# Patient Record
Sex: Female | Born: 1965 | Race: Black or African American | Hispanic: No | Marital: Married | Smoking: Never smoker
Health system: Southern US, Community
[De-identification: ages and names within clinical notes are randomized; demographics above are authoritative.]

## PROBLEM LIST (undated history)

## (undated) DIAGNOSIS — I471 Supraventricular tachycardia, unspecified: Secondary | ICD-10-CM

## (undated) DIAGNOSIS — K219 Gastro-esophageal reflux disease without esophagitis: Secondary | ICD-10-CM

## (undated) DIAGNOSIS — D649 Anemia, unspecified: Secondary | ICD-10-CM

## (undated) DIAGNOSIS — F411 Generalized anxiety disorder: Secondary | ICD-10-CM

## (undated) DIAGNOSIS — K5909 Other constipation: Secondary | ICD-10-CM

## (undated) DIAGNOSIS — R51 Headache: Secondary | ICD-10-CM

## (undated) DIAGNOSIS — I10 Essential (primary) hypertension: Secondary | ICD-10-CM

## (undated) HISTORY — PX: CERVICAL DISC SURGERY: SHX588

## (undated) HISTORY — DX: Headache: R51

## (undated) HISTORY — DX: Supraventricular tachycardia: I47.1

## (undated) HISTORY — PX: TUBAL LIGATION: SHX77

## (undated) HISTORY — DX: Generalized anxiety disorder: F41.1

## (undated) HISTORY — DX: Other constipation: K59.09

## (undated) HISTORY — PX: HERNIA REPAIR: SHX51

## (undated) HISTORY — PX: ABDOMINAL HYSTERECTOMY: SHX81

## (undated) HISTORY — DX: Supraventricular tachycardia, unspecified: I47.10

---

## 1997-06-04 ENCOUNTER — Inpatient Hospital Stay (HOSPITAL_COMMUNITY): Admission: AD | Admit: 1997-06-04 | Discharge: 1997-06-04 | Payer: Self-pay | Admitting: Obstetrics & Gynecology

## 1997-09-09 ENCOUNTER — Ambulatory Visit (HOSPITAL_COMMUNITY): Admission: RE | Admit: 1997-09-09 | Discharge: 1997-09-09 | Payer: Self-pay | Admitting: Obstetrics and Gynecology

## 1997-11-26 ENCOUNTER — Ambulatory Visit (HOSPITAL_COMMUNITY): Admission: RE | Admit: 1997-11-26 | Discharge: 1997-11-26 | Payer: Self-pay | Admitting: Obstetrics and Gynecology

## 1998-01-28 ENCOUNTER — Inpatient Hospital Stay (HOSPITAL_COMMUNITY): Admission: AD | Admit: 1998-01-28 | Discharge: 1998-01-28 | Payer: Self-pay | Admitting: *Deleted

## 1998-01-29 ENCOUNTER — Inpatient Hospital Stay (HOSPITAL_COMMUNITY): Admission: AD | Admit: 1998-01-29 | Discharge: 1998-01-31 | Payer: Self-pay | Admitting: Obstetrics & Gynecology

## 1998-11-10 ENCOUNTER — Encounter: Admission: RE | Admit: 1998-11-10 | Discharge: 1998-11-10 | Payer: Self-pay | Admitting: Internal Medicine

## 1998-12-09 ENCOUNTER — Encounter: Payer: Self-pay | Admitting: Internal Medicine

## 1998-12-09 ENCOUNTER — Encounter: Admission: RE | Admit: 1998-12-09 | Discharge: 1998-12-09 | Payer: Self-pay | Admitting: Internal Medicine

## 1999-05-19 ENCOUNTER — Other Ambulatory Visit: Admission: RE | Admit: 1999-05-19 | Discharge: 1999-05-19 | Payer: Self-pay | Admitting: *Deleted

## 1999-11-05 ENCOUNTER — Emergency Department (HOSPITAL_COMMUNITY): Admission: EM | Admit: 1999-11-05 | Discharge: 1999-11-06 | Payer: Self-pay | Admitting: Emergency Medicine

## 1999-12-12 ENCOUNTER — Inpatient Hospital Stay (HOSPITAL_COMMUNITY): Admission: RE | Admit: 1999-12-12 | Discharge: 1999-12-13 | Payer: Self-pay | Admitting: Neurosurgery

## 1999-12-12 ENCOUNTER — Encounter: Payer: Self-pay | Admitting: Neurosurgery

## 2000-09-11 ENCOUNTER — Other Ambulatory Visit: Admission: RE | Admit: 2000-09-11 | Discharge: 2000-09-11 | Payer: Self-pay | Admitting: Obstetrics and Gynecology

## 2000-11-29 ENCOUNTER — Encounter: Payer: Self-pay | Admitting: Obstetrics and Gynecology

## 2000-11-29 ENCOUNTER — Ambulatory Visit (HOSPITAL_COMMUNITY): Admission: RE | Admit: 2000-11-29 | Discharge: 2000-11-29 | Payer: Self-pay | Admitting: Obstetrics and Gynecology

## 2001-06-24 ENCOUNTER — Ambulatory Visit (HOSPITAL_COMMUNITY): Admission: RE | Admit: 2001-06-24 | Discharge: 2001-06-24 | Payer: Self-pay | Admitting: Obstetrics and Gynecology

## 2001-08-09 ENCOUNTER — Ambulatory Visit (HOSPITAL_COMMUNITY): Admission: AD | Admit: 2001-08-09 | Discharge: 2001-08-09 | Payer: Self-pay | Admitting: Obstetrics and Gynecology

## 2001-08-13 ENCOUNTER — Inpatient Hospital Stay (HOSPITAL_COMMUNITY): Admission: AD | Admit: 2001-08-13 | Discharge: 2001-08-15 | Payer: Self-pay | Admitting: Obstetrics and Gynecology

## 2001-09-20 ENCOUNTER — Ambulatory Visit (HOSPITAL_COMMUNITY): Admission: RE | Admit: 2001-09-20 | Discharge: 2001-09-20 | Payer: Self-pay | Admitting: Obstetrics and Gynecology

## 2002-05-15 ENCOUNTER — Emergency Department (HOSPITAL_COMMUNITY): Admission: EM | Admit: 2002-05-15 | Discharge: 2002-05-15 | Payer: Self-pay | Admitting: Emergency Medicine

## 2003-01-28 ENCOUNTER — Ambulatory Visit: Admission: RE | Admit: 2003-01-28 | Discharge: 2003-01-28 | Payer: Self-pay | Admitting: Internal Medicine

## 2003-08-31 ENCOUNTER — Encounter: Payer: Self-pay | Admitting: Family Medicine

## 2003-08-31 LAB — CONVERTED CEMR LAB: TSH: 1.15 microintl units/mL

## 2003-11-26 ENCOUNTER — Encounter: Payer: Self-pay | Admitting: Emergency Medicine

## 2003-11-27 ENCOUNTER — Observation Stay (HOSPITAL_COMMUNITY): Admission: EM | Admit: 2003-11-27 | Discharge: 2003-11-28 | Payer: Self-pay | Admitting: Emergency Medicine

## 2003-11-27 ENCOUNTER — Encounter: Payer: Self-pay | Admitting: Cardiovascular Disease

## 2003-11-30 ENCOUNTER — Emergency Department (HOSPITAL_COMMUNITY): Admission: EM | Admit: 2003-11-30 | Discharge: 2003-11-30 | Payer: Self-pay | Admitting: *Deleted

## 2003-12-03 ENCOUNTER — Ambulatory Visit: Payer: Self-pay | Admitting: Internal Medicine

## 2003-12-08 ENCOUNTER — Ambulatory Visit: Payer: Self-pay | Admitting: Cardiology

## 2003-12-09 ENCOUNTER — Ambulatory Visit (HOSPITAL_COMMUNITY): Admission: RE | Admit: 2003-12-09 | Discharge: 2003-12-09 | Payer: Self-pay | Admitting: Cardiology

## 2003-12-09 HISTORY — PX: CARDIAC CATHETERIZATION: SHX172

## 2003-12-22 ENCOUNTER — Ambulatory Visit: Payer: Self-pay | Admitting: Family Medicine

## 2004-02-03 ENCOUNTER — Ambulatory Visit: Payer: Self-pay | Admitting: Family Medicine

## 2004-06-29 ENCOUNTER — Ambulatory Visit: Payer: Self-pay | Admitting: Internal Medicine

## 2004-12-17 ENCOUNTER — Emergency Department: Payer: Self-pay | Admitting: General Practice

## 2005-02-08 ENCOUNTER — Emergency Department (HOSPITAL_COMMUNITY): Admission: EM | Admit: 2005-02-08 | Discharge: 2005-02-08 | Payer: Self-pay | Admitting: Emergency Medicine

## 2005-10-23 ENCOUNTER — Ambulatory Visit: Payer: Self-pay | Admitting: Family Medicine

## 2005-11-20 ENCOUNTER — Emergency Department (HOSPITAL_COMMUNITY): Admission: EM | Admit: 2005-11-20 | Discharge: 2005-11-20 | Payer: Self-pay | Admitting: Emergency Medicine

## 2006-03-10 ENCOUNTER — Ambulatory Visit: Payer: Self-pay | Admitting: Family Medicine

## 2006-03-14 ENCOUNTER — Ambulatory Visit: Payer: Self-pay | Admitting: Family Medicine

## 2006-07-16 ENCOUNTER — Encounter (INDEPENDENT_AMBULATORY_CARE_PROVIDER_SITE_OTHER): Payer: Self-pay | Admitting: Internal Medicine

## 2006-07-16 ENCOUNTER — Ambulatory Visit: Payer: Self-pay | Admitting: Family Medicine

## 2006-07-16 DIAGNOSIS — F411 Generalized anxiety disorder: Secondary | ICD-10-CM | POA: Insufficient documentation

## 2006-08-20 ENCOUNTER — Emergency Department (HOSPITAL_COMMUNITY): Admission: EM | Admit: 2006-08-20 | Discharge: 2006-08-20 | Payer: Self-pay | Admitting: Emergency Medicine

## 2006-11-21 ENCOUNTER — Ambulatory Visit: Payer: Self-pay | Admitting: Family Medicine

## 2006-11-21 LAB — CONVERTED CEMR LAB
Bacteria, UA: 0
Specific Gravity, Urine: 1.015

## 2006-11-27 ENCOUNTER — Ambulatory Visit (HOSPITAL_COMMUNITY): Admission: RE | Admit: 2006-11-27 | Discharge: 2006-11-27 | Payer: Self-pay | Admitting: Obstetrics and Gynecology

## 2006-11-27 ENCOUNTER — Encounter: Payer: Self-pay | Admitting: Family Medicine

## 2006-11-30 ENCOUNTER — Encounter (INDEPENDENT_AMBULATORY_CARE_PROVIDER_SITE_OTHER): Payer: Self-pay | Admitting: *Deleted

## 2006-12-06 ENCOUNTER — Other Ambulatory Visit: Admission: RE | Admit: 2006-12-06 | Discharge: 2006-12-06 | Payer: Self-pay | Admitting: Obstetrics & Gynecology

## 2006-12-19 ENCOUNTER — Ambulatory Visit: Payer: Self-pay | Admitting: Gastroenterology

## 2006-12-20 ENCOUNTER — Ambulatory Visit: Payer: Self-pay | Admitting: Cardiology

## 2007-01-02 ENCOUNTER — Ambulatory Visit: Payer: Self-pay

## 2007-01-02 ENCOUNTER — Encounter: Payer: Self-pay | Admitting: Cardiology

## 2007-03-27 ENCOUNTER — Encounter: Payer: Self-pay | Admitting: Family Medicine

## 2007-03-27 DIAGNOSIS — I498 Other specified cardiac arrhythmias: Secondary | ICD-10-CM | POA: Insufficient documentation

## 2007-03-27 DIAGNOSIS — K5909 Other constipation: Secondary | ICD-10-CM | POA: Insufficient documentation

## 2007-03-27 DIAGNOSIS — E7800 Pure hypercholesterolemia, unspecified: Secondary | ICD-10-CM | POA: Insufficient documentation

## 2007-03-27 DIAGNOSIS — R109 Unspecified abdominal pain: Secondary | ICD-10-CM | POA: Insufficient documentation

## 2007-03-27 DIAGNOSIS — R198 Other specified symptoms and signs involving the digestive system and abdomen: Secondary | ICD-10-CM | POA: Insufficient documentation

## 2007-05-01 ENCOUNTER — Telehealth: Payer: Self-pay | Admitting: Family Medicine

## 2007-09-24 ENCOUNTER — Ambulatory Visit: Payer: Self-pay | Admitting: Family Medicine

## 2007-09-24 LAB — CONVERTED CEMR LAB
AST: 23 units/L (ref 0–37)
Albumin: 3.7 g/dL (ref 3.5–5.2)
BUN: 17 mg/dL (ref 6–23)
Basophils Relative: 0.2 % (ref 0.0–3.0)
Creatinine, Ser: 0.9 mg/dL (ref 0.4–1.2)
Direct LDL: 119.3 mg/dL
Eosinophils Absolute: 0 10*3/uL (ref 0.0–0.7)
Eosinophils Relative: 0.4 % (ref 0.0–5.0)
GFR calc Af Amer: 88 mL/min
GFR calc non Af Amer: 73 mL/min
HCT: 36.1 % (ref 36.0–46.0)
MCV: 89.1 fL (ref 78.0–100.0)
Monocytes Absolute: 0.5 10*3/uL (ref 0.1–1.0)
RBC: 4.05 M/uL (ref 3.87–5.11)
WBC: 6.9 10*3/uL (ref 4.5–10.5)

## 2007-10-01 ENCOUNTER — Ambulatory Visit: Payer: Self-pay | Admitting: Family Medicine

## 2007-10-01 DIAGNOSIS — S139XXA Sprain of joints and ligaments of unspecified parts of neck, initial encounter: Secondary | ICD-10-CM | POA: Insufficient documentation

## 2007-10-01 DIAGNOSIS — R519 Headache, unspecified: Secondary | ICD-10-CM | POA: Insufficient documentation

## 2007-10-01 DIAGNOSIS — R51 Headache: Secondary | ICD-10-CM | POA: Insufficient documentation

## 2007-10-01 DIAGNOSIS — J310 Chronic rhinitis: Secondary | ICD-10-CM | POA: Insufficient documentation

## 2007-11-28 ENCOUNTER — Telehealth: Payer: Self-pay | Admitting: Family Medicine

## 2008-01-15 ENCOUNTER — Ambulatory Visit (HOSPITAL_COMMUNITY): Admission: RE | Admit: 2008-01-15 | Discharge: 2008-01-15 | Payer: Self-pay | Admitting: Neurosurgery

## 2008-01-21 ENCOUNTER — Ambulatory Visit: Payer: Self-pay | Admitting: Family Medicine

## 2008-01-21 DIAGNOSIS — L259 Unspecified contact dermatitis, unspecified cause: Secondary | ICD-10-CM | POA: Insufficient documentation

## 2008-02-19 ENCOUNTER — Ambulatory Visit: Payer: Self-pay | Admitting: Family Medicine

## 2008-02-20 ENCOUNTER — Encounter: Payer: Self-pay | Admitting: Family Medicine

## 2008-03-11 ENCOUNTER — Ambulatory Visit (HOSPITAL_COMMUNITY): Admission: RE | Admit: 2008-03-11 | Discharge: 2008-03-11 | Payer: Self-pay | Admitting: Obstetrics & Gynecology

## 2008-06-02 ENCOUNTER — Emergency Department (HOSPITAL_COMMUNITY): Admission: EM | Admit: 2008-06-02 | Discharge: 2008-06-02 | Payer: Self-pay | Admitting: Emergency Medicine

## 2008-11-13 ENCOUNTER — Ambulatory Visit: Payer: Self-pay | Admitting: Internal Medicine

## 2008-11-13 DIAGNOSIS — L989 Disorder of the skin and subcutaneous tissue, unspecified: Secondary | ICD-10-CM | POA: Insufficient documentation

## 2008-11-16 LAB — CONVERTED CEMR LAB
Calcium: 8.7 mg/dL (ref 8.4–10.5)
Creatinine, Ser: 1 mg/dL (ref 0.4–1.2)
GFR calc non Af Amer: 77.43 mL/min (ref >60)
Glucose, Bld: 91 mg/dL (ref 70–99)
Sodium: 138 meq/L (ref 135–145)
TSH: 1.38 microintl units/mL (ref 0.35–5.50)

## 2009-03-12 ENCOUNTER — Ambulatory Visit: Payer: Self-pay | Admitting: Family Medicine

## 2009-03-12 DIAGNOSIS — J069 Acute upper respiratory infection, unspecified: Secondary | ICD-10-CM | POA: Insufficient documentation

## 2009-04-11 ENCOUNTER — Emergency Department (HOSPITAL_COMMUNITY): Admission: EM | Admit: 2009-04-11 | Discharge: 2009-04-11 | Payer: Self-pay | Admitting: Family Medicine

## 2009-04-11 ENCOUNTER — Emergency Department (HOSPITAL_COMMUNITY): Admission: EM | Admit: 2009-04-11 | Discharge: 2009-04-12 | Payer: Self-pay | Admitting: Emergency Medicine

## 2009-04-14 ENCOUNTER — Ambulatory Visit: Payer: Self-pay | Admitting: Family Medicine

## 2009-04-14 DIAGNOSIS — R091 Pleurisy: Secondary | ICD-10-CM | POA: Insufficient documentation

## 2009-05-11 ENCOUNTER — Ambulatory Visit: Payer: Self-pay | Admitting: Gastroenterology

## 2009-08-10 ENCOUNTER — Ambulatory Visit: Payer: Self-pay | Admitting: Family Medicine

## 2009-08-10 DIAGNOSIS — F4329 Adjustment disorder with other symptoms: Secondary | ICD-10-CM | POA: Insufficient documentation

## 2009-08-11 ENCOUNTER — Telehealth: Payer: Self-pay | Admitting: Family Medicine

## 2009-08-17 ENCOUNTER — Ambulatory Visit: Payer: Self-pay | Admitting: Psychology

## 2009-08-31 ENCOUNTER — Ambulatory Visit: Payer: Self-pay | Admitting: Family Medicine

## 2009-08-31 ENCOUNTER — Ambulatory Visit: Payer: Self-pay | Admitting: Psychology

## 2009-09-06 ENCOUNTER — Emergency Department (HOSPITAL_COMMUNITY): Admission: EM | Admit: 2009-09-06 | Discharge: 2009-09-06 | Payer: Self-pay | Admitting: Emergency Medicine

## 2009-09-06 ENCOUNTER — Encounter (INDEPENDENT_AMBULATORY_CARE_PROVIDER_SITE_OTHER): Payer: Self-pay | Admitting: *Deleted

## 2009-09-08 ENCOUNTER — Encounter: Payer: Self-pay | Admitting: Family Medicine

## 2009-09-08 ENCOUNTER — Ambulatory Visit: Payer: Self-pay | Admitting: Psychology

## 2009-09-13 ENCOUNTER — Other Ambulatory Visit: Admission: RE | Admit: 2009-09-13 | Discharge: 2009-09-13 | Payer: Self-pay | Admitting: Obstetrics and Gynecology

## 2009-09-15 ENCOUNTER — Ambulatory Visit: Payer: Self-pay | Admitting: Psychology

## 2009-09-22 ENCOUNTER — Ambulatory Visit: Payer: Self-pay | Admitting: Psychology

## 2010-01-11 ENCOUNTER — Ambulatory Visit (HOSPITAL_COMMUNITY)
Admission: RE | Admit: 2010-01-11 | Discharge: 2010-01-11 | Payer: Self-pay | Source: Home / Self Care | Attending: Obstetrics and Gynecology | Admitting: Obstetrics and Gynecology

## 2010-01-11 LAB — HM MAMMOGRAPHY: HM Mammogram: NORMAL

## 2010-02-20 ENCOUNTER — Encounter: Payer: Self-pay | Admitting: Obstetrics and Gynecology

## 2010-03-01 NOTE — Letter (Signed)
Summary: [facility] FMLA Form  [facility] FMLA Form   Imported By: Beau Fanny 08/11/2009 16:56:59  _____________________________________________________________________  External Attachment:    Type:   Image     Comment:   External Document

## 2010-03-01 NOTE — Assessment & Plan Note (Signed)
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  Increased stress caring for mother (near [place]) and three kids.  Took this week off with vacation time.  SVT symptoms have increased as stress increased.  Has h/o anxiety, increased recently.  More insomnia noted by Wright.  No SI/HI.  Much more fatigued.  "I don't have things that I can look forward to."  More tearful.  "It's never been like this."   No tob, no alcohol, no illicits.  H/o postpartum depression.    Current Medications (verified): 1)  Multivitamins   Tabs (Multiple Vitamin) .... One Dialy  Allergies: 1)  ! Sulfa  Social History: Reviewed history from 03/27/2007 and no changes required. Marital Status: Married  LIVES WITH HUSBAND Children: 4 CHILDREN  Occupation: [employer] TE[employer]  Review of Systems       See HPI.  Otherwise noncontributory.    Physical Exam  General:  GEN: alert and oriented, but tearful describing her condition/history HEENT: mucous membranes moist NECK: supple w/o LA CV: rrr.  no murmur, no ectopy PULM: ctab, no inc wob ABD: soft, +bs EXT: no edema, 2+ pulses SKIN: no acute rash    Impression & Recommendations:  Problem # 1:  ADJUSTMENT REACTION WITH PHYSICAL SYMPTOMS (ICD-309.82) LIkey adjustment d/o with depressed mood and physical symptoms of increase SVT episodes.  Refer to Dr. Laymond Purser and hold out of work.  Will fill out FMLA.  Plan to hold Wright out for two weeks and then recheck.  Contracts for safety.   No SI/HI.  Okay for outpatient follow up.   Orders: Psychology Referral  (Psychology)  Problem # 2:  SUPRAVENTRICULAR TACHYCARDIA (ICD-427.89) contact with labs.  EKG NSR w/o changes from prev.  will restart toprol and have Wright use it as needed.  She agrees with plan.  Orders: TLB-TSH (Thyroid Stimulating Hormone) (84443-TSH) EKG w/ Interpretation (93000)  Her updated medication list for this problem includes:    Metoprolol Tartrate 25 Mg Tabs (Metoprolol tartrate) .Marland Kitchen... 1 by mouth q12 hours as needed svt/palpitations  Complete Medication List: 1)  Multivitamins Tabs (Multiple vitamin) .... One dialy 2)  Metoprolol Tartrate 25 Mg Tabs (Metoprolol tartrate) .Marland Kitchen.. 1 by mouth q12 hours as needed svt/palpitations  Wright Instructions: 1)  Please schedule a follow-up appointment in 2 weeks for [employer]  I'll fill out your FMLA papers.   Prescriptions: METOPROLOL TARTRATE 25 MG TABS (METOPROLOL TARTRATE) 1 by mouth q12 hours as needed SVT/palpitations  #60 x 3   Entered and Authorized by:   Crawford Givens MD   Signed by:   Crawford Givens MD on 08/10/2009   Method used:   [facility]        [facility]  [address] (retail)       [address]       [place], [place]  [address]       Ph: [phone] or [phone]  Fax: [phone]   RxID:   [id number]   Current Allergies (reviewed today): ! SULFA

## 2010-03-01 NOTE — Progress Notes (Signed)
Summary: FMLA paperwork notification  Phone Note Outgoing Call   Call placed by: Delilah Shan, CMA Call placed to: Patient Summary of Call: Tried to contact patient to let her know that her FMLA paperwork was complete and ready for pick up.  Recording states that her mailbox is full and was not able to leave a message. Initial call taken by: Delilah Shan CMA Duncan Dull),  August 11, 2009 2:41 PM

## 2010-03-01 NOTE — Letter (Signed)
Summary: E-Mail from Dr.Jane Rudean Curt from Dr.Jane Inkerman   Imported By: Beau Fanny 09/10/2009 09:52:47  _____________________________________________________________________  External Attachment:    Type:   Image     Comment:   External Document

## 2010-03-01 NOTE — Assessment & Plan Note (Signed)
Summary: ? SINUS INFECTION/ lb   Vital Signs:  Patient profile:   45 year old female Height:      64 inches Weight:      223.6 pounds BMI:     38.52 Temp:     98.2 degrees F oral Pulse rate:   60 / minute Pulse rhythm:   regular BP sitting:   110 / 60  (left arm) Cuff size:   large  Vitals Entered By: Benny Lennert CMA Duncan Dull) (March 12, 2009 8:37 AM)  History of Present Illness: Chief complaint ? sinus infection  Acute Visit History:      The patient complains of cough, headache, nasal discharge, and sinus problems.  These symptoms began 4 days ago.  She denies earache, fever, and sore throat.  Other comments include: using mucinex and robitussin.        The character of the cough is described as intermittant dry.  There is no history of wheezing or shortness of breath associated with her cough.        She complains of sinus pressure, nasal congestion, purulent drainage, and epistaxis.  The patient has had a past history of sinusitis.  She denies previous sinus surgery or sinusitis in the last 2 months.        Problems Prior to Update: 1)  Skin Lesion, Benign  (ICD-709.9) 2)  Eczema  (ICD-692.9) 3)  Chronic Rhinitis  (ICD-472.0) 4)  Cervical Muscle Strain  (ICD-847.0) 5)  Health Maintenance Exam  (ICD-V70.0) 6)  Hypercholesterolemia  (ICD-272.0) 7)  Change in Bowels  (ICD-787.99) 8)  Headache, Chronic  (ICD-784.0) 9)  Supraventricular Tachycardia  (ICD-427.89) 10)  Abdominal Pain, Lower  (ICD-789.09) 11)  Constipation, Chronic  (ICD-564.09) 12)  Anxiety, Mild  (ICD-300.00)  Current Medications (verified): 1)  Multivitamins   Tabs (Multiple Vitamin) .... One Dialy 2)  Amoxicillin 500 Mg Tabs (Amoxicillin) .... 2 Tab By Mouth Two Times A Day X 10 Days, Fill If Not Improving in 2-3 Days  Allergies: 1)  ! Sulfa  Past History:  Past medical, surgical, family and social histories (including risk factors) reviewed, and no changes noted (except as noted below).  Past  Medical History: Reviewed history from 01/30/2007 and no changes required. 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 NSVD X4 [id number] Hernia surgery Tubal ligation RUPTURED DISC REPAIR / ANT APPROACH C5/6, C6/7 [facility] R/O SVT WITH RBBB:(10/28-29/2005) 2D ECHO EF 55% OTHERWISE NORMAL (11/27/2003) ADENOSINE MYOVIEW--APICAL DEFECT EF 59%:(12/03/2003) CATHERIZATION: --NORMAL :(12/09/2003)  Family History: Reviewed history from 10/01/2007 and no changes required. Father:? UNKNOWN IN [facility]  Mother: A  9  DVT'S   CEREBRAL BLOOD CLOT Siblings: 5 BROTHERS 7 SISTERS CV: NEGATIVE HBP: NEGATIVE DM: +MGM GOUT/ARTHRITIS: PROSTATE CANCER:  DECEASED MGF PROSTATE CANCER BREAST/OVARIAN/UTERINE CANCER: COLON CANCER:  DEPRESSION: + M UNCLE ETOH ABUSE : THROUGHOUT FAMILY OTHER: + STROKES MGM  Social History: Reviewed history from 03/27/2007 and no changes required. Marital Status: Married  LIVES WITH HUSBAND Children: 4 CHILDREN  Occupation: [employer] TE[employer]  Review of Systems General:  Complains of chills and fatigue. CV:  Denies chest pain or discomfort. GI:  Denies abdominal pain.  Physical Exam  General:  Well-developed,well-nourished,in no acute distress; alert,appropriate and cooperative throughout examination Head:  no maxillary sinus ttp Ears:  clear fluid b TMs Nose:  erythematous  turbinates Mouth:  MMM, no oropharyngeal erythema, post nasal drip Neck:  no carotid bruit or thyromegaly no cervical or supraclavicular lymphadenopathy  Lungs:  Normal respiratory effort, chest expands symmetrically. Lungs are clear to auscultation, no crackles or wheezes. Heart:  Normal rate and regular rhythm. S1 and S2  normal without gallop, murmur, click, rub or other extra sounds. Pulses:  R and L posterior tibial pulses are full and equal bilaterally    Impression & Recommendations:  Problem # 1:  URI (ICD-465.9) Not clcearly sinus infection...treat with mucinex D and nasasl saline, but if not improving may fill antibiotics. Call if not improving in next  7-10 days.   Complete Medication List: 1)  Multivitamins Tabs (Multiple vitamin) .... One dialy 2)  Amoxicillin 500 Mg Tabs (Amoxicillin) .... 2 tab by mouth two times a day x 10 days, fill if not improving in 2-3 days  Patient Instructions: 1)  Naasal saline irrigation, mucinex D. 2)  If not improving in another 2-3 days may fill antibitoics for presumed sinus infection.  Prescriptions: AMOXICILLIN 500 MG TABS (AMOXICILLIN) 2 tab by mouth two times a day x 10 days, fill if not improving in 2-3 days  #40 x 0   Entered and Authorized by:   Kerby Nora MD   Signed by:   Kerby Nora MD on 03/12/2009   Method used:   Print then Give to Patient   RxID:   [id number]   Current Allergies (reviewed today): ! SULFA

## 2010-03-01 NOTE — Assessment & Plan Note (Signed)
Summary: F/U [place] ON 04/11/09/CLE   Vital Signs:  Patient profile:   45 year old female Weight:      225 pounds Temp:     97.9 degrees F oral Pulse rate:   76 / minute Pulse rhythm:   regular BP sitting:   110 / 74  (left arm) Cuff size:   large  Vitals Entered By: Sydell Axon LPN (April 14, 2009 8:56 AM) CC: Hospital follow-up from [facility]   History of Present Illness: Pt here for followup of ER from pleurisy. She was seen for the floowing: Had bad cold about 4 weeks ago with right lower side pain which resolved. Then she got left sided pain between the chest and abd and was dresse for churcch with nylon stockings that restricted her and she felt was part of the problem. She was given Naprosyn and Percocet...she does no tolerate the Percocet and has stopped that. Her symptoms have improved markedly, now bothering her basically at night with difficulty finding good position in which to sleep. She coontinues to have frequent unexp[lained abd pain and was set up with Dr Christella Hartigan who wanted to do a colonoscopy but wanted a cardiac eval first. She is now ready to be seen again.  Problems Prior to Update: 1)  Uri  (ICD-465.9) 2)  Skin Lesion, Benign  (ICD-709.9) 3)  Eczema  (ICD-692.9) 4)  Chronic Rhinitis  (ICD-472.0) 5)  Cervical Muscle Strain  (ICD-847.0) 6)  Health Maintenance Exam  (ICD-V70.0) 7)  Hypercholesterolemia  (ICD-272.0) 8)  Change in Bowels  (ICD-787.99) 9)  Headache, Chronic  (ICD-784.0) 10)  Supraventricular Tachycardia  (ICD-427.89) 11)  Abdominal Pain, Lower  (ICD-789.09) 12)  Constipation, Chronic  (ICD-564.09) 13)  Anxiety, Mild  (ICD-300.00)  Medications Prior to Update: 1)  Multivitamins   Tabs (Multiple Vitamin) .... One Dialy 2)  Amoxicillin 500 Mg Tabs (Amoxicillin) .... 2 Tab By Mouth Two Times A Day X 10 Days, Fill If Not Improving in 2-3 Days  Allergies: 1)  ! Sulfa  Family History: Father:dec  UNKNOWN IN [facility] CHF  Mother: A  73  DVT'S    CEREBRAL BLOOD CLOT Siblings: 5 BROTHERS 7 SISTERS CV: NEGATIVE HBP: NEGATIVE DM: +MGM GOUT/ARTHRITIS: PROSTATE CANCER:  DECEASED MGF PROSTATE CANCER BREAST/OVARIAN/UTERINE CANCER: COLON CANCER:  DEPRESSION: + M UNCLE ETOH ABUSE : THROUGHOUT FAMILY OTHER: + STROKES MGM  Physical Exam  General:  Well-developed,well-nourished,in no acute distress; alert,appropriate and cooperative throughout examination, appears comfortable. Head:  Normocephalic and atraumatic without obvious abnormalities. No apparent alopecia or balding. Eyes:  Conjunctiva clear bilaterally.  Ears:  clear fluid b TMs Nose:  erythematous turbinates Mouth:  MMM, no oropharyngeal erythema, mild  post nasal drip, crypts of tonsils with debris. Neck:  no carotid bruit or thyromegaly no cervical or supraclavicular lymphadenopathy  Lungs:  Normal respiratory effort, chest expands symmetrically. Lungs are clear to auscultation, no crackles or wheezes. No rub heard. Heart:  Normal rate and regular rhythm. S1 and S2 normal without gallop, murmur, click, rub or other extra sounds. Abdomen:  Bowel sounds positive,abdomen soft and non-tender without masses, organomegaly or hernias noted. NT to palp.   Impression & Recommendations:  Problem # 1:  PLEURISY (ICD-511.0) Assessment Improved Better. Cont Naprosyn intil resolved and then stop. Insure taking with food.  Problem # 2:  URI (ICD-465.9) Assessment: Unchanged  Stable, better by history. Use Guaifenesin. Her updated medication list for this problem includes:    Naprosyn 500 Mg Tabs (Naproxen) .Marland Kitchen... Take one by  mouth two times a day  Instructed on symptomatic treatment. Call if symptoms persist or worsen.   Problem # 3:  ABDOMINAL PAIN, LOWER (ICD-789.09) Assessment: Unchanged  Continues. Stop Naprosyn as soon as able. Refer back to Dr Christella Hartigan. Start Prilosec 45 mins prior to brfst until seen. Her updated medication list for this problem includes:    Naprosyn 500  Mg Tabs (Naproxen) .Marland Kitchen... Take one by mouth two times a day  Orders: Gastroenterology Referral (GI)  Discussed use of medications, application of heat or cold, and exercises.   Complete Medication List: 1)  Multivitamins Tabs (Multiple vitamin) .... One dialy 2)  Naprosyn 500 Mg Tabs (Naproxen) .... Take one by mouth two times a day  Patient Instructions: 1)  Refer back to Dr Christella Hartigan. Had been referrred in the past. 2)  30 mins spent with pt, more than half in counselling.  Current Allergies (reviewed today): ! SULFA

## 2010-03-01 NOTE — Letter (Signed)
Summary: E-Mail from Dr.Perrin  E-Mail from Dr.Perrin   Imported By: Beau Fanny 09/14/2009 13:54:15  _____________________________________________________________________  External Attachment:    Type:   Image     Comment:   External Document

## 2010-03-01 NOTE — Letter (Signed)
Summary: Nadara Eaton letter  [place] at [facility]  [address] [place], [place] [address]   Phone: [phone]  Fax: [phone]       09/06/2009 MRN: [id number]  [facility] [address] [place], [place]  [address]  Dear Ms. [facility] [place], and [place] announce the retirement of Arta Silence, M.D., from full-time practice at the [facility] office effective July 29, 2009 and his plans of returning part-time.  It is important to Dr. Hetty Ely and to our practice that you understand that [facility] has seven physicians in our office for your health care needs.  We will continue to offer the same exceptional care that you have today.    Dr. Hetty Ely has spoken to many of you about his plans for retirement and returning part-time in the fall.   We will continue to work with you through the transition to schedule appointments for you in the office and meet the high standards that [place] is committed to.   Again, it is with great pleasure that we share the news that Dr. Hetty Ely will return to [facility] at [facility] in October of 2011 with a reduced schedule.    If you have any questions, or would like to request an appointment with one of our physicians, please call us at [phone] and press the option for Scheduling an appointment.  We take pleasure in providing you with excellent patient care and look forward to seeing you at your next office visit.  Our [facility] Physicians are:  Tillman Abide, M.D. Laurita Quint, M.D. Roxy Manns, M.D. Kerby Nora, M.D. Hannah Beat, M.D. Ruthe Mannan, M.D. We proudly welcomed Raechel Ache, M.D. and Eustaquio Boyden, M.D. to the practice in July/August 2011.  Sincerely,  [facility]

## 2010-03-01 NOTE — Assessment & Plan Note (Signed)
Summary: ROA FOR 2 WEEK FOLLOW-UP/ FMLA FORMS/JRR   Vital Signs:  Patient profile:   45 year old female Height:      64 inches Weight:      223 pounds BMI:     38.42 Temp:     98.7 degrees F oral Pulse rate:   80 / minute Pulse rhythm:   regular BP sitting:   102 / 70  (left arm) Cuff size:   large  Vitals Entered By: Delilah Shan CMA Duncan Dull) (August 31, 2009 11:57 AM) CC: 2 week follow up  - FMLA forms   History of Present Illness: See prev note.  Has intermittent FMLA.  In counseling with Dr. Laymond Purser.  No CP, palpitations.  Less tearful.  Much brighter.  Has been at work in meantime.  Children going back to school soon, and this may help with her schedule/work at home.  Continues to work 3rd shift. No SI/HI.    Occ nasal stuffiness with some postnasal gtt, noticed more in AM.  No fevers.   Allergies: 1)  ! Sulfa  Review of Systems       See HPI.  Otherwise negative.    Physical Exam  General:  GEN: nad, alert and oriented, bright affect.  HEENT: mucous membranes moist, TM w/o erythema, nasal epithelium injected, OP with mild cobblestoning NECK: supple w/o LA CV: rrr. PULM: ctab, no inc wob EXT: no edema    Impression & Recommendations:  Problem # 1:  ADJUSTMENT REACTION WITH PHYSICAL SYMPTOMS (ICD-309.82) Improved, continue with Dr. Laymond Purser.  Has intermittent FMLA.  follow up with me as needed.   Will use betablocker as needed.  Has not needed it recently.  TSH was normal and d/w patient.  Can use nasal saline for post nasal gtt.  I don't think this is infectious; she can use OTC meds as long as they don't have decongestants.  She understood.   Complete Medication List: 1)  Multivitamins Tabs (Multiple vitamin) .... One dialy 2)  Metoprolol Tartrate 25 Mg Tabs (Metoprolol tartrate) .Marland Kitchen.. 1 by mouth q12 hours as needed svt/palpitations  Patient Instructions: 1)  Please schedule a follow-up appointment as needed .   Current Allergies (reviewed today): ! SULFA

## 2010-04-22 ENCOUNTER — Emergency Department (HOSPITAL_COMMUNITY)
Admission: EM | Admit: 2010-04-22 | Discharge: 2010-04-23 | Disposition: A | Discharge status: Home / Self Care | Payer: 59 | Attending: Emergency Medicine | Admitting: Emergency Medicine

## 2010-04-22 DIAGNOSIS — R42 Dizziness and giddiness: Secondary | ICD-10-CM | POA: Insufficient documentation

## 2010-04-22 DIAGNOSIS — M542 Cervicalgia: Secondary | ICD-10-CM | POA: Insufficient documentation

## 2010-04-22 DIAGNOSIS — M546 Pain in thoracic spine: Secondary | ICD-10-CM | POA: Insufficient documentation

## 2010-04-25 LAB — POCT URINALYSIS DIP (DEVICE)
Bilirubin Urine: NEGATIVE
Nitrite: NEGATIVE
Urobilinogen, UA: 2 mg/dL — ABNORMAL HIGH (ref 0.0–1.0)
pH: 7.5 (ref 5.0–8.0)

## 2010-04-25 LAB — POCT I-STAT, CHEM 8
Calcium, Ion: 1.08 mmol/L — ABNORMAL LOW (ref 1.12–1.32)
Chloride: 109 mEq/L (ref 96–112)
HCT: 37 % (ref 36.0–46.0)
Hemoglobin: 12.6 g/dL (ref 12.0–15.0)
TCO2: 25 mmol/L (ref 0–100)

## 2010-06-14 NOTE — Assessment & Plan Note (Signed)
[facility]                         GASTROENTEROLOGY OFFICE NOTE   EPSIE, WALTHALL                      MRN:          [id number]  DATE:12/19/2006                            DOB:          Aug 18, 1965    REFERRING PHYSICIAN:  Arta Silence, MD   GASTROENTEROLOGY CONSULTATION   REASON FOR REFERRAL:  Dr. Hetty Ely asked me to evaluate Ms. Meth in  consultation regarding chronic constipation and intermittent abdominal  pain.   HISTORY OF PRESENT ILLNESS:  [name] is a very pleasant 44 year old  woman who has had constipation as long as she can remember, although  this seems to be getting worse in the past several months.  She has also  noticed a change in the caliber of her stool to being a bit thinner now.  She says she moves her bowels every four days or so.  She does have to  strain a lot.  She has never had blood in her stool.  She has tried  Metamucil about two to three times a week and notices it did help, never  took it on a regular daily basis.  She has also tried green tea  laxatives which do seem to help on an emergency basis.   She does have intermittent lower abdominal pains that always improve  when she moves her bowels.   She also brought up a syncopal event that she suffered this weekend.  She passed out while getting into her car.  Her husband had to help her  get out of the car.  They did not seek medical attention.  She has had  no chest pain.  She has had no shortness of breath.   REVIEW OF SYSTEMS:  Notable for a 25 pound weight gain in the past year.  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   PAST MEDICAL HISTORY:  1. SVT.  Dr. Diona Browner is her cardiologist.  She has had two cardiac      catheterization's.  The most recent was in November, 2005 and it      was negative per Dr. Lorenza Chick recent note.  2. Anxiety.  3. Chronic headaches.  4. Hernia  surgery.  5. Tubal ligation.   CURRENT MEDICATIONS:  1. Metoprolol 25 mg twice daily.  2. Allegra p.r.n.   ALLERGIES:  SULFA ANTIBIOTIC'S cause rash.   SOCIAL HISTORY:  Married with four children, nonsmoker, nondrinker.   FAMILY HISTORY:  No colon cancer, colon polyps in the family.   PHYSICAL EXAMINATION:  VITAL SIGNS:  Height 5 feet, 5 inches, weight 213  pounds.  Blood pressure 104/70.  Pulse 80 and regular.  CONSTITUTIONAL:  Generally well-appearing except obesity.  NEUROLOGICAL:  Alert and oriented x3.  HEENT:  Eyes: Extraocular movements intact.  Mouth:  Oropharynx moist,  no lesions.  NECK:  Supple, no lymphadenopathy.  CARDIOVASCULAR:  Heart has regular rate and rhythm.  LUNGS:  Clear to auscultation bilaterally.  ABDOMEN:  Soft, nontender, nondistended, normal bowel sounds.  EXTREMITIES:  No lower extremity edema.  SKIN:  No rashes or lesions on visible extremities.   ASSESSMENT/PLAN:  This is a 45 year old woman with chronic constipation,  recent change in bowel habits, irritable bowel syndrome-like abdominal  discomfort.   First, I am recommending she begin taking fiber supplements on a daily  basis.  The Metamucil has helped for her in the past but she has not  taken it more than just two to three times a week.  I suspect this will,  indeed, help her chronic constipation, probably improve her intermittent  abdominal pains as well.  She should undergo colonoscopy in the near  future given she has definitely noticed a worsening of her constipation  and a change in the caliber of her stool.  That being said, she had a  syncopal event over this past weekend and did not seek medical  attention.  She has a history of supraventricular tachycardia and has  been followed by Dr. Diona Browner.  Currently, she feels fine.  She has no  chest pain, no shortness of breath and her heart has regular rate and  rhythm but I will arrange for her to be re-evaluated by cardiology in  the  next couple of days, prior to any scheduling of a colonoscopy.  She  will return to see me in the office in three to four weeks.  Hopefully,  her cardiac evaluation can be done by then and will feel much more  comfortable scheduling her for a colonoscopy on an elective basis.   Lastly, she will get a basic set labs today including a CBC and a  complete metabolic profile and thyroid testing.     Rachael Fee, MD  Electronically Signed    DPJ/MedQ  DD: 12/19/2006  DT: 12/19/2006  Job #: [id number]   cc:   Jonelle Sidle, MD  Arta Silence, MD

## 2010-06-14 NOTE — Assessment & Plan Note (Signed)
[name]                            CARDIOLOGY OFFICE NOTE   [name], [name]                      MRN:          [id number]  DATE:12/20/2006                            DOB:          12-21-1965    [name] is a very pleasant 45 year old female whom I am asked to  evaluate for a syncopal episode.  Note the patient has had 2 previous  cardiac catheterizations.  The first was in 1998.  I do not have those  records available.  She had recurrent chest pain in 2005.  At that time,  she had a stress Myoview that showed an ejection fraction of 59%.  There  was a question of apical ischemia.  There was also an echocardiogram on  November 27, 2003, that showed normal LV function.  She ultimately  underwent cardiac catheterization on December 09, 2003.  The ejection  fraction was 55%, and there was no coronary artery disease.  Note, she  also had a history of SVT.  She was seen by Dr. Graciela Husbands by her report, but  I do not have that record available.  They decided to follow this and  treat with Toprol as needed.  Note, she has not had any episodes of SVT  in the past 3 years other than 1 episode.  Note, she can have some  dyspnea on exertion, but there is no orthopnea, PND, pedal edema,  history of syncope or exertional chest pain.  This past Saturday  morning, she was returning home from working.  She works at night as a  Research officer, trade union.  On the way home, she developed nausea as well as  diaphoresis.  She also states that she was generally weak.  When she got  to her house, her husband was helping her out of the car, and she states  she became severely weak.  She did not have frank syncope but stated she  knew what was going on around her but did not care.  She was taken to  the sofa and felt weak through the rest of the day but has gradually  improved since then.  She was seen by Dr. Christella Hartigan of gastroenterology  yesterday for abdominal pain, and he referred  her to [place] for evaluation  of her syncope.  Note, at the time of her symptoms, she was not having  shortness of breath, chest pain, or palpitations.  There was no  incontinence, loss of strength of sensation in her extremities.   MEDICATIONS:  1. Metoprolol 25 mg p.o. daily p.r.n.  2. She takes Flexeril and Skelaxin p.r.n.  3. SHE HAS AN ALLERGY TO SULFA.   SOCIAL HISTORY:  She is a nonsmoker nor does she consume alcohol.   FAMILY HISTORY:  Positive for congestive heart failure in her father,  but there is no coronary disease by her report.   PAST MEDICAL HISTORY:  1. There is no diabetes mellitus or hypertension, but there is a      history of hyperlipidemia.  2. She has a history of an umbilical hernia as well as  gastroesophageal reflux disease.  3. She has had previous cervical disk surgery.  4. She also has lower back pain.  5. She is undergoing evaluation for abdominal pain.   REVIEW OF SYSTEMS:  She denies any headaches, fevers, or chills.  There  is no productive cough or hemoptysis.  There is no dysphagia,  odynophagia, melena, or hematochezia.  She has had abdominal pain.  There has also been constipation.  There is no dysuria or hematuria.  There is no rash or seizure activity.  There is no orthopnea, PND, or  pedal edema.  She has had some low back pain.  The remaining systems are  negative.   PHYSICAL EXAM:  Blood pressure 125/81, and her pulse is 71.  She weighs  213 pounds.  She is well-developed and somewhat obese.  She is in no acute distress.  SKIN:  Warm and dry.  She does not appear to be depressed, and there is no peripheral  clubbing.  BACK:  Normal.  HEENT:  Normal with normal eyelids.  NECK:  Supple with a normal upstroke bilaterally, and I cannot  appreciate bruits.  There is no jugular venous distention, and no  thyromegaly is noted.  CHEST:  Clear to auscultation with normal expansion.  CARDIOVASCULAR:  Regular rate and rhythm with normal S1  and S2.  There  are no murmurs, rubs, or gallops noted.  There are no changes with  Valsalva.  I cannot palpate a PMI.  Note, she does have a previous  incision in her right neck area from her previous cervical disk surgery.  ABDOMEN:  Mild tenderness in the lower quadrants bilaterally.  There was  no distention.  There were no masses palpated nor is there  hepatosplenomegaly noted.  There is no bruit.  EXTREMITIES:  No edema that I could palpate, no cords.  She has 2+  dorsalis pedis pulses bilaterally.  NEUROLOGIC:  Grossly intact.   Her electrocardiograms shows a sinus rhythm with a right bundle-branch  block which is old.   DIAGNOSES:  1. Presyncope - [name] states that she did not have frank loss of      consciousness but felt nauseated, diaphoretic, and weak all over.      She may have had a vagal event or may have been dehydrated from the      previous evening's work.  There are also may have been question of      a viral infection.  At this point, I am not convinced that she has      syncope.  I will check an echocardiogram to reassess her left      ventricular function.  If it is normal, then we will follow her      expectantly.  2. History of supraventricular tachycardia - She has only had 1      episode in the past 3 years.  She will continue on her Lopressor as      needed.  We can consider ablation in the future if her      supraventricular tachycardia becomes more frequent.  3. Hyperlipidemia - She will follow up with her primary care physician      concerning this issue.  4. Abdominal pain -  This is being evaluated by Dr. Christella Hartigan.  5. History of gastroesophageal reflux disease.  6. Right bundle-branch block.  7. History of cervical spine surgery.   I will see her back in 1 year to make sure that her SVT is  stable.     [name] Chloe Som, MD, [facility]  Electronically Signed    BSC/MedQ  DD: 12/20/2006  DT: 12/20/2006  Job #: [id number]   cc:   Rachael Fee,  MD

## 2010-06-17 NOTE — Op Note (Signed)
NAME:  [name], [name]                         ACCOUNT NO.:  [id number]   MEDICAL RECORD NO.:  [id number]                   PATIENT TYPE:  AMB   LOCATION:  DAY                                  FACILITY:  [facility]   PHYSICIAN:  Tilda Burrow, M.D.              DATE OF BIRTH:  11/09/1965   DATE OF PROCEDURE:  DATE OF DISCHARGE:                                 OPERATIVE REPORT   PREOPERATIVE DIAGNOSES:  1. Elective sterilization.  2. Umbilical hernia.   POSTOPERATIVE DIAGNOSES:  1. Elective sterilization.  2. Umbilical hernia.   PROCEDURE:  1. Left and right tubal sterilization Falope ring.  2. Umbilical herniorrhaphy.   SURGEON:  Tilda Burrow, M.D.   ASSISTANT:  None.   ANESTHESIA:  General -- Minerva Areola, CRNA   COMPLICATIONS:  None   FINDINGS:  A small 1 cm hernia defect in the fascia with 2-3 cm long bulge  of hernia sac up beneath the skin and cephalad to the umbilicus.  Normal  appearing tubes bilaterally.   DETAILS OF PROCEDURE:  The patient was taken to the operating room and  prepped and draped for combined abdominal and vaginal procedure with the  uterus grasped with a Hulka tenaculum for uterine manipulation and in-and-  out catheterization of the bladder performed.   The umbilical incision was performed in a semilunar incision above the  umbilicus, approximately 2 cm in length.  The skin was elevated with Allis  clamps and the hernia sac immediately identified and opened sufficiently for  maintaining orientation.  An Allis clamp was placed on the edge of the  hernia sac as well as skin edge and we then proceeded to dissect free the  long tubular hernia from its base at the umbilicus.  It extended somewhat  cephalad.  Once the edges of the fascia had been reached, a 10 mm  laparoscopic trocar was inserted directly through the umbilical hernia into  the abdomen and insufflation performed.  The laparoscope was used to  visualize the abdomen and no  abnormalities identified.   The suprapubic trocar was introduced under direct visualization and then we  proceeded with tubal sterilization in a standard fashion.  Each fallopian  tube was grasped at its midportion, elevated and a Falope ring applied to  that midportion.  The incarcerated loop of tube and the underlying  mesosalpinx was infiltrated with dilute Marcaine solution 0.25%  to achieve  analgesia during the postoperative phase.   We then proceeded to remove the laparoscopic equipment and then the hernia  repair continued.  Completion of hernia repair consisted of excision of the  hernia sac at the level of the fascia.  There was some bleeding from beneath  the hernia sac in the preperitoneal fat, and this required Bovie cautery, as  necessary under direct visualization.  The peritoneum was closed using 3-0  Vicryl and additional point cautery used as necessary  in the preperitoneal  fat space.  The fascia was trimmed to result in good, approximately flat  closure of the fascia and then closed with continuous running #0 Vicryl.  The subcutaneous tissues were reapproximated by 3 interrupted subcu sutures  of 4-0 Dexon to achieve the appropriate indentation of the umbilicus and  then subcuticular 4-0 Dexon closure of the skin performed.  Steri-Strips  were placed and pressure dressing applied.  Patient went to the recovery  room in good condition.                                                 Tilda Burrow, M.D.    JVF/MEDQ  D:  09/20/2001  T:  09/21/2001  Job:  [id number]

## 2010-06-17 NOTE — Discharge Summary (Signed)
NAMEMARIATERESA, BATRA NO.:  [id number]   MEDICAL RECORD NO.:  [id number]          PATIENT TYPE:  INP   LOCATION:  2034                         FACILITY:  [facility]   PHYSICIAN:  Smyrna Bing, M.D.  DATE OF BIRTH:  1965-04-24   DATE OF ADMISSION:  11/27/2003  DATE OF DISCHARGE:  11/28/2003                                 DISCHARGE SUMMARY   PROCEDURES:  A 2D echocardiogram.   HOSPITAL COURSE:  Ms. Bethards is a 45 year old female with no known history  of coronary artery disease.  She had chest pain, shortness of breath and  went to [facility] where she was found to be in SVT with a right  bundle branch block.  She converted with IV Amiodarone and had no symptoms  after conversion.  She has only had one prior episode of symptoms, and that  was in 1998.  She was transferred to [facility] for further  evaluation and treatment.   She was seen by Dr. Dietrich Pates and Dr. Graciela Husbands.  A 2D echocardiogram was done  which showed an EF of 55-65%.  No significant wall motion abnormalities.  Dr. Graciela Husbands felt that she could get an outpatient Cardiolite and follow up in  the office if her cardiogram is normal.   On November 28, 2003, Ms. Bentsen was evaluated by Dr. Myrtis Ser.  She is stable  and will be discharged on November 28, 2003, with outpatient follow up  arranged.   CONDITION ON DISCHARGE:  Stable.   DISCHARGE DIAGNOSES:  1.  Supraventricular tachycardia.  2.  Right bundle branch block.  3.  Preserved left ventricular function with an ejection fraction of 55-65%      by echocardiogram this admission.  4.  History of C-spine surgery.  5.  History of hiatal hernia/reflux disease.   DISCHARGE INSTRUCTIONS:  1.  Her activity level is to include no strenuous activity until after the      stress test.  2.  She is to stick to a low-fat diet.  3.  She will be followed by Dr. Graciela Husbands in the office, and they will call for      an appointment.  4.  She is to follow up  with Dr. Hetty Ely as needed.   DISCHARGE MEDICATIONS:  Coated aspirin 81 mg daily.      Rhon   RB/MEDQ  D:  11/28/2003  T:  11/28/2003  Job:  [id number]   cc:   Laurita Quint, M.D.  [address] [place]  [place] [address]  Fax: [phone]   Duke Salvia, M.D.

## 2010-06-17 NOTE — Op Note (Signed)
[facility]  Patient:    [name], [name] Visit Number: [id number] MRN: [id number]          Service Type: OBS Location: 4A A425 01 Attending Physician:  Tilda Burrow Dictated by:   Duane Lope, M.D. Proc. Date: 08/13/01 Admit Date:  08/13/2001 Discharge Date: 08/15/2001                             Operative Report  PROCEDURE:  Epidural placement.  SURGEON:  Duane Lope, M.D.  INDICATIONS:  [name] is 3 cm, 80%, and -1 station in active labor, requesting an epidural.  She understands the risks and benefits; she has had one before. Platelet count is appropriate.  Blood pressure is 152/72.  Lactated Ringers bolus of 1000 cc is given.  DESCRIPTION OF PROCEDURE:  The patient is placed in the sitting position.  The iliac crests were identified.  The spinous processes were identified and marked.  The interspace of L2-L3 and L3-L4 were identified and marked. Betadine was used to prep.  The area was field draped.  Lidocaine 1% was injected at the L2-L3 interspace, and a 17-gauge Touhy needle was used and, with one pass, placed to the epidural space with a loss of resistance technique.  The epidural catheter was then fed.  The needle was in at 7 cm, and it was placed at 12 for a total depth of 5 cm into the epidural space. While the needle was still in, 10 cc of 0.125% bupivacaine was given as a test dose, and then 3 minutes later through the catheter, an additional 0.125% Sensorcaine was given with no untoward effects.  At 1801, the initial bolus from the epidural bag was started which was 11 minutes after the second bolus, and the patient was doing well and already having relief of contraction pain. it is 0.125% Sensorcaine with 2 mcg/cc of fentanyl; 5 cc was given, and continuous was then started at 12 cc per hour.  The patient had no untoward effects.  Her blood pressure is now about 117/55, and the fetal heart rate is still reassuring. Dictated  by:   Duane Lope, M.D. Attending Physician:  Tilda Burrow DD:  08/13/01 TD:  08/16/01 Job: [id number]

## 2010-06-17 NOTE — H&P (Signed)
[facility]  Patient:    [name], [name] Visit Number: [id number] MRN: [id number]          Service Type: OBS Location: 4A A425 01 Attending Physician:  Tilda Burrow Dictated by:   Chilton Si, C.N.M. Admit Date:  08/13/2001   CC:         [facility]   History and Physical  CHIEF COMPLAINT:  Prodromal labor x 2 days with cervical change.  HISTORY OF PRESENT ILLNESS:  The patient is a 45 year old gravida 6, para 2-1-2-3 with an EDC of August 20, 2001, based on last menstrual period and first and second trimester ultrasound.  She began prenatal care in her first trimester and has had regular visits throughout.  PRENATAL LABS:  Blood type AB negative, rubella immune, HBsAg, HIV, gonorrhea, Chlamydia, and beta strep are all negative.  One-hour GTT was normal at 133. Twenty-eight week labs revealed an H&H of 10.2 and 31.5.  However, she has been reluctant to take iron due to constipation.  Total weight gain has been 10 pounds with appropriate fundal height growth.  Blood pressures 110s to 120s/50s to 80s.  PAST MEDICAL HISTORY:  Positive for GERD for which she takes Prevacid, Allegra for seasonal allergies.  PAST SURGICAL HISTORY:  Dislocated neck disk in 2001 at C6 with a C6 and 7 plate fusion.  ALLERGIES:  SULFA DRUGS.  FAMILY HISTORY:  Positive for hypertension, diabetes, and Downs syndrome.  SOCIAL HISTORY:  Is married.  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  GYNECOLOGICAL HISTORY:  Two elective ABs in 1982 and 1984, one preterm delivery at 35 weeks induced secondary to oligohydramnios.  Two other vaginal deliveries at 40 and 41 weeks with a pelvis proven to 8 pounds, 14 ounces.  PHYSICAL EXAMINATION:  HEENT:  Within normal limits.  HEART:  Regular rate and rhythm.  LUNGS:  Clear to auscultation bilaterally.  ABDOMEN:  Soft and nontender.  Mild irregular contractions.  Fetal heart rate 130s, 140s,  reactive without decelerations.  PELVIC:  Cervix 3, 50%, -2 with a vertex presentation which is a change from yesterdays exam of 250-3.  IMPRESSION:  Intrauterine pregnancy at [redacted] weeks gestation, prodromal labor.  PLAN:  Artificial rupture of membranes reveals clear fluid, will augment with Pitocin.  Plan epidural anesthesia. Dictated by:   Chilton Si, C.N.M. Attending Physician:  Tilda Burrow DD:  08/13/01 TD:  08/13/01 Job: [id number]

## 2010-06-17 NOTE — H&P (Signed)
[name], [name]                         ACCOUNT NO.:  [id number]   MEDICAL RECORD NO.:  [id number]                   PATIENT TYPE:   LOCATION:                                       FACILITY:  [facility]   PHYSICIAN:  Tilda Burrow, M.D.              DATE OF BIRTH:  [birth year]   DATE OF ADMISSION:  09/19/2001  DATE OF DISCHARGE:                                HISTORY & PHYSICAL   ADMISSION DIAGNOSIS:  Desire for elective permanent sterilization.   HISTORY OF PRESENT ILLNESS:  This 45 year old, gravida 6, para 2-1-3-4 is  admitted at this time for elective permanent sterilization.  Her recent  vaginal delivery on August 13, 2001, was without complications by Jacklyn Shell, CNM.  She is admitted for permanent sterilization, having  confirmed her desire for permanent sterilization during the recent pregnancy  and she has reaffirmed that at the time of her postpartum visit on September 13, 2001.  The technical aspects of the procedure have been reviewed using  [employer] instructional booklet.  A failure rate of 1:100 reported to the  patient.  The postpartum course has been notable only for breast tenderness  related to discontinuing breastfeeding a few days ago.  She is having  tenderness and swelling of the nipple area on the right side greater than  the left, but without erythema.  This is considered not to represent an  infection at this time.   During the pregnancy, the patient had a small umbilical hernia notable.  This seems to be less prominent at this time.  The plans are to assess the  umbilicus at the time of the tubal ligation.  It may be necessary to place a  couple of permanent sutures at the level of the fascia.  The patient's  consent reflects this.   PAST MEDICAL HISTORY:  Positive for GERD for which she takes Prevacid and  seasonal allergies.   PAST SURGICAL HISTORY:  Fusion of the neck at C6-7 due to traumatic injury  in 2001.   ALLERGIES:  SULFA  DRUGS.   SOCIAL HISTORY:  Married.  Works at the [employer] at [employer].   PHYSICAL EXAMINATION:  GENERAL APPEARANCE:  A healthy African American  female, alert and oriented x 3.  HEENT:  Pupils equal, round, and reactive.  NECK:  Supple.  A well-healed surgical scar to the right side of the base of  the neck.  CARDIOVASCULAR:  Unremarkable.  BREASTS:  Engorged areola with lactation continuing.  No erythema.  No  purulence noted.  ABDOMEN:  No frank bulging at the umbilicus at this time.  PELVIC:  The external genitalia show scarring at the episiotomy site.  Vaginal exam normal.  Cervix nulliparous.  Uterus normal in size, shape, and  contour for postpartum status.  EXTREMITIES:  Grossly normal.   ASSESSMENT:  Desire for elective sterilization.    PLAN:  Laparoscopic tubal sterilization with Falope rings.  Will inspect the  edges of the fascia at the time of umbilical incision and consider closure  of hernia if necessary.                                               Tilda Burrow, M.D.    JVF/MEDQ  D:  09/15/2001  T:  09/15/2001  Job:  [id number]

## 2010-06-17 NOTE — Op Note (Signed)
[place]. [facility]  Patient:    [name], [name]                        MRN: [id number] Proc. Date: 12/12/99 Adm. Date:  [id number] Attending:  Donn Pierini                           Operative Report  PREOPERATIVE DIAGNOSES: 1. 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 2. Right C6-7 herniated nucleus pulposus with radiculopathy.  POSTOPERATIVE DIAGNOSES: 1. 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 2. Right C6-7 herniated nucleus pulposus with radiculopathy.  PROCEDURE:  C5-6 and C6-7 anterior cervical diskectomy and fusion with allograft and anterior plating.  SURGEON:  Julio Sicks, M.D.  ASSISTANT:  Reinaldo Meeker, M.D.  ANESTHESIA:  General endotracheal.  INDICATIONS:  Ms. Busker is a 45 year old female with a history of bilateral arm pain, left greater than right, consistent with a left-sided C6 radiculopathy and a right-sided C7 radiculopathy.  Conservative management has failed to improve her symptoms.  MRI scanning demonstrates a leftward foraminal disk herniation at C5-6 and a rightward paracentral disk herniation at C6-7.  Both these disk herniations significantly compress the exiting nerve roots, and at C6-7 there is also some degree of spinal cord compression as well.  The patient has been counseled as to her options.  She has decided to proceed with a two-level anterior cervical diskectomy and fusion with allograft and anterior plating for hopeful relief of her symptoms.  DESCRIPTION OF PROCEDURE:  The patient was taken to the operating room and placed on the operating table in supine position.  After an adequate level of anesthesia, patient positioned supine with her neck slightly extended and the head placed in halter traction.  The patients anterior cervical region is shaved, prepped sterilely, and a 10 blade used to make a layered skin incision from just right of midline to the medial border of  the sternocleidomastoid muscle.  This was carried down sharply to platysma.  Platysma was then divided vertically and dissection proceeds along the medial border of the sternocleidomastoid muscle and carotid sheath on the right side.  Trachea and esophagus were mobilized and retracted toward the left.  Prevertebral fascia is stripped off the anterior spinal column.  The longus colli muscles are then elevated bilaterally using electrocautery.  Deep self-retaining retractor is placed.  Intraoperative fluoroscopy was used, and the level was confirmed. Diskectomies are performed at C5-6 and C6-7 by first incising the disk space and then removing disk material using pituitary rongeurs, forward- and back-angled Carlin curettes, Kerrison rongeurs, and the high-speed drill, and all disk was removed down to the posterior annulus.  The microscope was brought into the field, and starting first at C5-6, remaining aspects of the annulus and osteophytes were elevated and resected in a piecemeal fashion using Kerrison rongeurs.  The posterior longitudinal ligament was elevated and then resected in a piecemeal fashion using Kerrison rongeurs, where the underlying thecal sac was identified.  A wide decompression was then performed of the spinal cord and the exiting C6 neural foramen bilaterally.  A moderate amount of foraminal disk herniation was encountered off to the left side.  The left-sided C6 nerve root was completely decompressed.  Gelfoam was placed topically for hemostasis.  The procedure was then repeated at C6-7.  At this time the findings were that of a right-sided C6-7 disk herniation extending just proximal to the  foramen.  A wide decompression was also achieved at C6-7. At this point the thecal sac and nerve roots were quite freed on both levels. The wound was then copiously irrigated _____.  Gelfoam was placed topically for hemostasis.  The disk space was then distracted at C5-6, and a 6  mm fibular wedge allograft was impacted in place and recessed approximately 1 mm anterior to the cortical surface.  At C6-7, the disk space was distracted and a 7 mm fibular wedge allograft was impacted into place and recessed approximately 1 mm anterior to the cortical surface.  The microscope was removed.  A 42 mm Atlantis anterior cervical plate was then placed over the C5, C6, and C7 levels.  This was then attached using 13 mm fixed-angle screws at all three levels.  Two screws were placed in each level.  All screws were given a final tightening, found to be solidly to the bone.  The locking screws were engaged.  Final images using fluoroscopy revealed good position of bone graft and hardware, proper operative level, with normal alignment of the spine.  The wound was inspected for hemostasis, which was found to be good. Platysma was reapproximated with 3-0 Vicryl suture.  Skin was reapproximated with 5-0 PDS in a _____ fashion.  Steri-Strips and a sterile dressing were applied.  There were no apparent complications.  The patient left the OR, and she returns to recovery postoperatively. DD:  12/12/99 TD:  12/12/99 Job: [id number]

## 2010-06-17 NOTE — Procedures (Signed)
NAMEPAYTIENCE, BURES               ACCOUNT NO.:  [id number]   MEDICAL RECORD NO.:  [id number]          PATIENT TYPE:  INP   LOCATION:  1823                         FACILITY:  [facility]   PHYSICIAN:  Edward L. Juanetta Gosling, M.D.DATE OF BIRTH:  [birth year]   DATE OF PROCEDURE:  DATE OF DISCHARGE:                                EKG INTERPRETATION   RESULTS:  The computer has read this as ventricular tachycardia.  I think  that is an error.  The rhythm appears to me to be a supraventricular  tachycardia and there is a right bundle branch block.  Abnormal  electrocardiogram.     Edwa   ELH/MEDQ  D:  11/27/2003  T:  11/27/2003  Job:  [id number]

## 2010-06-17 NOTE — Consult Note (Signed)
NAMEJAZILYN, [name]               ACCOUNT NO.:  [id number]   MEDICAL RECORD NO.:  [id number]          PATIENT TYPE:  EMS   LOCATION:  MAJO                         FACILITY:  [facility]   PHYSICIAN:  [name], M.D. LHCDATE OF BIRTH:  [birth year]   DATE OF CONSULTATION:  11/30/2003  DATE OF DISCHARGE:                                   CONSULTATION   REFERRING PHYSICIAN:  Dr. Sheppard Wright. [name].   PRIMARY CARE PHYSICIAN:  Dr. Laurita Wright.   PRIMARY CARDIOLOGIST:  Dr. Molly Maduro Wright/Dr. Duke Wright.   CHIEF COMPLAINT:  Chest pain.   HISTORY OF PRESENT ILLNESS:  [name] is a 45 year old African American  female with no known history of coronary artery disease.  She was admitted  to [facility] for chest pain and discharged on November 28, 2003  after an overnight stay.  She had cardiac enzymes checked that were negative  for an MI.  She also had an echocardiogram which was normal.  A Cardiolite  was to be performed as an outpatient.   Since discharge, she complains of fatigue and early dyspnea on exertion.  She went to church Sunday and had some dyspnea on exertion, but nothing else  until after church, when she developed sort of a sharp chest pain that would  come and go.  It lasted all day.  Today, she awoke with dull left-sided  chest pain that is occasionally worse with deep inspiration and will radiate  to her back if she moves or twists in a particular way.  The pain has been  continuous since 8 a.m.  At its worst it was a 5/10 and is a 2/10 now.  There are no associated palpitations.  She checked her heart rate before  coming to the hospital and it was always less than 100.  She had some  shortness of breath with this, but no nausea, vomiting or diaphoresis.  This  is the same as her admitting symptoms when she was here earlier.   She has no history of diabetes, hypertension, hyperlipidemia, tobacco use,  although there is some family history of coronary  artery disease.   PAST MEDICAL HISTORY:  1.  Past medical history is significant for SVT, which was a presenting      symptom during her last admission, for which she received IV amiodarone      that converted her to sinus rhythm, but is not on a beta blocker.  She      had a right bundle branch block with her initial EKG.  2.  She has gastroesophageal reflux disease symptoms.  3.  Seasonal allergies.   SURGICAL HISTORY:  She has had C spine surgery.   ALLERGIES:  SULFA.   MEDICATIONS:  1.  Lopressor 25 mg b.i.d.  2.  Aspirin 81 mg daily.   SOCIAL HISTORY:  She lives with her husband locally and works at the [employer].  She does not smoke, abuse alcohol or drugs.   FAMILY HISTORY:  Her birth parents are both alive; her mother is 80; her  father  is approximately 60; she does not have heart disease, but he does.  She has no siblings with heart disease.   REVIEW OF SYSTEMS:  Review of systems is significant for some chills today;  she has also had a headache today.  She states she has had a recent upper  respiratory infection, but no greenish sputum and is currently not having  any significant problems with cough or of cold symptoms.  She has reflux  symptoms 1-2 times a week.  Review of systems is otherwise negative.   PHYSICAL EXAM:  VITAL SIGNS:  Temperature is 100.0, blood pressure 134/94,  pulse 71, respiratory rate 18 and O2 saturation 100% on room air.  GENERAL:  She is a well-developed, well-nourished African American female in  no acute distress.  HEENT:  Head is normocephalic and atraumatic with pupils equal, round and  reactive to light and accommodation, extraocular movements intact and  sclerae are clear.  Nares without discharge.  NECK:  There is no lymphadenopathy, thyromegaly, bruit or JVD noted.  CV:  Her heart is regular in rate and rhythm with no significant murmur, rub  or gallop noted.  Distal pulses are 2+ and no femoral bruits are   appreciated.  LUNGS:  Are clear to auscultation bilaterally.  SKIN:  There are no rashes or lesions noted.  ABDOMEN:  Is soft and nontender with active bowel sounds and no  hepatosplenomegaly by exam.  EXTREMITIES:  There is no cyanosis, clubbing or edema noted.  MUSCULOSKELETAL:  No joint deformity or effusions and no spine or CVA  tenderness.  NEUROLOGIC:  She is alert and oriented x3 with cranial nerves II-XII grossly  intact.   LABORATORY AND ACCESSORY CLINICAL DATA:  Chest x-ray:  No acute disease.   EKG:  Sinus rhythm, rate 67, with a right bundle branch block which is  unchanged from November 27, 2003.   Laboratory values are pending.   ASSESSMENT AND PLAN:  Chest pain:  She has a history of supraventricular  tachycardia, gastroesophageal reflux disease symptoms, as well as the chest  pain.  She called to schedule the Cardiolite today and told the office she  was having chest pain, so they told her to come to the emergency room.  Chest pain is atypical and may be musculoskeletal.  We will check a D-dimer  here today to exclude pulmonary embolus.  If the D-dimer is negative and her  cardiac enzymes are negative x1, which are pending, then she can be  discharged home and follow up with an outpatient Cardiolite.  She is also to  follow up with [name] for the supraventricular tachycardia, for which she  will continue to be on a beta blocker and a baby aspirin.  The patient is  otherwise stable and will follow up with her primary care physician for any  other medical problems.   COMMENT:  This is [name], P.A.-C, dictating for Dr. Olga Wright,  who saw the patient and determined the plan of care.       RB/MEDQ  D:  11/30/2003  T:  12/01/2003  Job:  [id number]

## 2010-06-17 NOTE — Op Note (Signed)
[facility]  Patient:    [name], [name] Visit Number: [id number] MRN: [id number]          Service Type: OBS Location: 4A A425 01 Attending Physician:  Tilda Burrow Dictated by:   Cathie Beams, C.N.M. Proc. Date: 08/13/01 Admit Date:  08/13/2001 Discharge Date: 08/15/2001   CC:         [facility]   Operative Report  DELIVERY NOTE  [name] progressed nicely throughout labor and, after noticing severe mild variable decelerations on the electronic fetal monitor, she was checked vaginally and was noted to be fully dilated at +3 station.  She was then positioned and instructed to push.  After approximately 5 minutes second stage, she delivered a viable female infant at 2113.  The mouth and nose were suctioned on the perineum, and a loose nuchal cord was easily reduced.  The baby was delivered over the next push.  Weight 7 pounds 14 ounces, Apgars 9/9. Pitocin 20 units diluted in 1000 cc lactated Ringers was then infused rapidly IV.  The placenta partially separated and was noted to have a fundal attachment that did not let loose despite controlled cord traction and maternal pushing effort.  She was having a moderate amount of vaginal bleeding that responded fairly well to bimanual exam; however, it was felt that measures to remove the placenta in a more rapid fashion were necessary.  The placenta was then removed by manual extraction and was carefully inspected, appeared to be intact and in one piece.  The fundus immediately became firm with very little blood loss noted.  The vagina was inspected, and a first degree perineal location not requiring suturing was noted.  The patient still had wonderful epidural anesthesia and tolerated the procedure very well. Estimated blood loss was approximately 500 cc.  Rocephin 1 g was given IV as prophylaxis due to the manual placenta extraction. Dictated by:   Cathie Beams,  C.N.M. Attending Physician:  Tilda Burrow DD:  08/13/01 TD:  08/17/01 Job: [id number]

## 2010-06-17 NOTE — Cardiovascular Report (Signed)
NAMEGIANNY, [name]               ACCOUNT NO.:  [id number]   MEDICAL RECORD NO.:  [id number]          PATIENT TYPE:  OIB   LOCATION:  2899                         FACILITY:  [facility]   PHYSICIAN:  Rollene Rotunda, M.D.   DATE OF BIRTH:  March 13, 1965   DATE OF PROCEDURE:  12/09/2003  DATE OF DISCHARGE:                              CARDIAC CATHETERIZATION   PRIMARY CARE PHYSICIAN:  Laurita Quint, M.D.   PROCEDURE:  Left heart catheterization, coronary arteriography.   INDICATION:  Evaluate patient with chest pain and a Cardiolite suggesting  reversible apical defect.   PROCEDURE NOTE:  Left heart catheterization was performed via the right  femoral artery.  The artery was cannulated using an anterior wall puncture.  A 6 French arterial sheath was inserted via the modified Seldinger  technique.  Preformed Judkins and a pigtail catheter were utilized.  The  patient tolerated the procedure well and left the lab in stable condition.   RESULTS:   HEMODYNAMICS:  1.  LV 132/40.  2.  Aortic 137/83.   CORONARIES:  The left main was normal.  The LAD was normal.  First diagonal  was large and normal.  The circumflex and the AV groove was normal.  OM-1  and OM-2 were small and normal.  The OM-3 was large and normal.  There were  two small posterior laterals which were normal.  The right coronary artery  was dominant vessel, but not particularly large.  It was normal.  There was  a small PDA which was normal.   LEFT VENTRICULOGRAM:  Left ventriculogram was obtained in the RAO  projection.  The EF was 65% with normal wall motion.   CONCLUSION:  1.  Normal coronaries.  2.  Normal left ventricular function.   PLAN:  No further cardiovascular testing is suggested.  I have discussed  this with Dr. Hetty Ely.  The patient will be referred back to him for  further evaluation of chest discomfort not related to high grade obstructive  coronary disease.       JH/MEDQ  D:  12/09/2003  T:   12/09/2003  Job:  [id number]   cc:   Laurita Quint, M.D.  [address] [place]  [place] [address]  Fax: [phone]

## 2010-06-17 NOTE — H&P (Signed)
NAME:  DIEM, DICOCCO NO.:  [id number]   MEDICAL RECORD NO.:  [id number]          PATIENT TYPE:  INP   LOCATION:  1823                         FACILITY:  [facility]   PHYSICIAN:  Castine Bing, M.D.  DATE OF BIRTH:  11-Aug-1965   DATE OF ADMISSION:  11/27/2003  DATE OF DISCHARGE:                                HISTORY & PHYSICAL   REFERRING PHYSICIAN:  Tinnie Gens P. Caporossi, M.D.   PRIMARY CARE PHYSICIAN:  Laurita Quint, M.D.   HISTORY OF PRESENT ILLNESS:  A 45 year old woman transferred from [facility] after presenting there with a wide complex tachycardia that appears  to be SVT with an underlying right bundle branch block, probably atrial  flutter with 2:1 AV conduction.  Ms. Diebold was diagnosed as having mitral  valve prolapse on echo at age 32 by Dr. Daisy Floro.  She has done well since  from a cardiac standpoint with no further diagnostic testing, no treatment,  and no significant symptoms.  She has generally enjoyed good health.  Specifically, there is no history of hypertension, diabetes, or  hypercholesterolemia.  She does not use tobacco products.  Earlier this  morning, she noted the onset of chest discomfort described as heaviness and  aching that is somewhat vague and located in the anterior chest.  There was  subsequently radiation to the left shoulder and arm with sharp pains in the  left shoulder.  She worked as a Veterinary surgeon during the day with no apparent  relationship to activity.  She had associated dyspnea.  The symptoms waxed  and waned somewhat, but did not completely resolve.  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  EKG revealed a wide complex tachycardia at a rate of 157.  There appeared to be atrial activity at twice that rate.  Intravenous  amiodarone was administered with conversion to sinus  rhythm.  Right bundle  branch was present with the same pattern as was noted during the  tachycardia.  An extreme right axis is present without significant ST-T wave  abnormalities.  Initial cardiac markers were negative.   Ms. Wesenberg describes similar symptoms occurring in 1998.  She was seen in  the emergency department at that time, but was not told of the presence of  an arrhythmia.  She does not recall any recent electrocardiograms.   PAST MEDICAL HISTORY:  1.  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  2.  She has a history of GERD symptoms, for which she uses PPI's      intermittently.   MEDICATIONS:  1.  She uses Allegra on a p.r.n. basis for seasonal allergies.  2.  She has also taken some vitamin preparations obtained over the Internet.  3.  She has also taken Chromium.   ALLERGIES:  SULFA DRUGS.   FAMILY HISTORY:  Positive for hypertension, diabetes, and Down syndrome.   SOCIAL HISTORY:  Married  and lives in [place] with her husband.  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  Denies the use of tobacco  products, alcohol, or illicit drugs.   REVIEW OF SYSTEMS:  Six pregnancies with 2 elective abortions, 1 preterm  delivery, and 3 normal spontaneous vaginal deliveries.  Intermittent  heartburn relieved with over-the-counter medications.  Somewhat overweight  with gradual weight gain.  Stable appetite.  Other systems reviewed and are  negative.   PHYSICAL EXAMINATION:  GENERAL:  Pleasant well-appearing woman in no acute  distress.  VITAL SIGNS:  Blood pressure is 125/55, heart rate 70 and regular,  respirations 18.  HEENT:  Anicteric sclerae.  Normal lids and conjunctivae.  NECK:  Right anterior surgical scar.  No jugular venous distention.  No  bruits.  ENDOCRINE:  No thyromegaly.  HEMATOPOIETIC:  No adenopathy.  LUNGS:  Clear.  CARDIAC:  Normal first and second heart sounds.  Moderate  systolic ejection  murmur.  ABDOMEN:  Soft and nontender.  No organomegaly.  Normal bowel sounds.  EXTREMITIES:  Normal distal pulses.  No edema.  NEUROMUSCULAR:  Symmetric strength and tone.  Normal cranial nerves.  MUSCULOSKELETAL:  No joint deformities.   LABORATORY DATA:  Laboratory at [facility] includes a normal CBC,  hypokalemia of 3.1, on an otherwise normal chemistry profile, and normal  point of care markers.  Chest x-ray revealed no acute abnormalities.  EKG as  described above.   IMPRESSION:  Ms. [place]ston presents with documented episode of SVT, and the  first time she has been told of right bundle branch block.  In the absence  of symptoms, and with a normal examination, structural heart disease is  unlikely.  An echocardiogram will be obtained to evaluate this possibility.  Her primary M.D. evaluated her thyroid approximately a year ago and told her  this was normal.  Interval development of hyperthyroidism is unlikely, but  TSH will be repeated.  Serial cardiac point of care markers will be  obtained, but ischemic heart disease is extremely unlikely.  Initial  treatment will be only with metoprolol at a dose of 25 mg b.i.d.  She will  be evaluated by the electrophysiology service in the morning for  consideration of ablation, but conservative therapy may be warranted unless  she has a recurrence in the near future.  We will attempt to examine the  neutraceuticals she has been taking to exactly define their content.  Because of their unknown content, a drug screen will be obtained, as well.  Potassium will be repleted with a repeat chemistry profile in the morning.      Robe   RR/MEDQ  D:  11/27/2003  T:  11/27/2003  Job:  [id number]

## 2010-07-27 ENCOUNTER — Encounter: Payer: Self-pay | Admitting: Family Medicine

## 2010-07-28 ENCOUNTER — Ambulatory Visit (INDEPENDENT_AMBULATORY_CARE_PROVIDER_SITE_OTHER): Payer: 59 | Admitting: Family Medicine

## 2010-07-28 ENCOUNTER — Encounter: Payer: Self-pay | Admitting: Family Medicine

## 2010-07-28 DIAGNOSIS — L259 Unspecified contact dermatitis, unspecified cause: Secondary | ICD-10-CM

## 2010-07-28 DIAGNOSIS — K219 Gastro-esophageal reflux disease without esophagitis: Secondary | ICD-10-CM | POA: Insufficient documentation

## 2010-07-28 NOTE — Patient Instructions (Signed)
Try using dove soap and see if that makes a difference. Wear a comfortable bra that doesn't retain heat.  You can try hydrocortisone if it returns. If it gets worse, let me know.  Elevate the head of your bed.

## 2010-07-28 NOTE — Assessment & Plan Note (Signed)
It appears this is all related to skin irritation/eczema.  Use a sheer bra that doesn't hold heat/sweat and use mild soap.  Use otc hydrocortisone prn and call back as needed.  She agrees.

## 2010-07-28 NOTE — Progress Notes (Signed)
She prev had a breast lesion that itched, was treated topically and resolved.   Now with 1-2cm across the L breast, sore and didn't itch like the prev lesion.  Used the same topical steroid cream (Prev used temovate) with partial improvement.  Wanted it checked since it didn't totally resolve.  Neg mammogram earlier this year.  Feeling okay o/w.  No FCNAVD.  Some AM throat congestion, but o/w doing well.  No nipple discharge.  Meds, vitals, and allergies reviewed.   ROS: See HPI.  Otherwise, noncontributory.  nad ncat Tm wnl Op wnl Neck supple w/o LA Chaperoned exam- normal breast exam except for minimal irritation on the areola x2 and a superficial 1cm lesion on the L inferior areola that doesn't have tenderness or erythema.   No other mass or LA.

## 2010-07-28 NOTE — Assessment & Plan Note (Signed)
This may be the cause of the AM sx.  Elevate head of bed and f/u prn.  Consider otc PPI treatment if not improved.

## 2010-09-09 ENCOUNTER — Emergency Department (HOSPITAL_COMMUNITY)
Admission: EM | Admit: 2010-09-09 | Discharge: 2010-09-09 | Disposition: A | Discharge status: Home / Self Care | Payer: 59 | Attending: Emergency Medicine | Admitting: Emergency Medicine

## 2010-09-09 ENCOUNTER — Ambulatory Visit: Payer: 59 | Admitting: Family Medicine

## 2010-09-09 ENCOUNTER — Telehealth: Payer: Self-pay | Admitting: *Deleted

## 2010-09-09 ENCOUNTER — Emergency Department (HOSPITAL_COMMUNITY): Payer: 59

## 2010-09-09 ENCOUNTER — Encounter (HOSPITAL_COMMUNITY): Payer: Self-pay | Admitting: Radiology

## 2010-09-09 DIAGNOSIS — I4510 Unspecified right bundle-branch block: Secondary | ICD-10-CM | POA: Insufficient documentation

## 2010-09-09 DIAGNOSIS — K8020 Calculus of gallbladder without cholecystitis without obstruction: Secondary | ICD-10-CM | POA: Insufficient documentation

## 2010-09-09 DIAGNOSIS — M545 Low back pain, unspecified: Secondary | ICD-10-CM | POA: Insufficient documentation

## 2010-09-09 DIAGNOSIS — R110 Nausea: Secondary | ICD-10-CM | POA: Insufficient documentation

## 2010-09-09 DIAGNOSIS — R12 Heartburn: Secondary | ICD-10-CM | POA: Insufficient documentation

## 2010-09-09 DIAGNOSIS — R109 Unspecified abdominal pain: Secondary | ICD-10-CM | POA: Insufficient documentation

## 2010-09-09 LAB — URINALYSIS, ROUTINE W REFLEX MICROSCOPIC
Leukocytes, UA: NEGATIVE
Nitrite: NEGATIVE
Specific Gravity, Urine: 1.011 (ref 1.005–1.030)
Urobilinogen, UA: 0.2 mg/dL (ref 0.0–1.0)
pH: 6.5 (ref 5.0–8.0)

## 2010-09-09 LAB — CBC
HCT: 36.9 % (ref 36.0–46.0)
Hemoglobin: 12.5 g/dL (ref 12.0–15.0)
MCH: 29.4 pg (ref 26.0–34.0)
MCHC: 33.9 g/dL (ref 30.0–36.0)
RDW: 14.3 % (ref 11.5–15.5)

## 2010-09-09 LAB — COMPREHENSIVE METABOLIC PANEL
Albumin: 3.6 g/dL (ref 3.5–5.2)
BUN: 12 mg/dL (ref 6–23)
Calcium: 8.9 mg/dL (ref 8.4–10.5)
Creatinine, Ser: 0.73 mg/dL (ref 0.50–1.10)
GFR calc Af Amer: >60 mL/min (ref >60)
Glucose, Bld: 97 mg/dL (ref 70–99)
Potassium: 3.5 mEq/L (ref 3.5–5.1)
Total Protein: 7.1 g/dL (ref 6.0–8.3)

## 2010-09-09 LAB — DIFFERENTIAL
Basophils Absolute: 0 10*3/uL (ref 0.0–0.1)
Eosinophils Relative: 0 % (ref 0–5)
Lymphocytes Relative: 27 % (ref 12–46)
Monocytes Absolute: 0.4 10*3/uL (ref 0.1–1.0)
Monocytes Relative: 5 % (ref 3–12)
Neutro Abs: 5.1 10*3/uL (ref 1.7–7.7)

## 2010-09-09 LAB — URINE MICROSCOPIC-ADD ON

## 2010-09-09 LAB — LIPASE, BLOOD: Lipase: 24 U/L (ref 11–59)

## 2010-09-09 LAB — PREGNANCY, URINE: Preg Test, Ur: NEGATIVE

## 2010-09-09 NOTE — Telephone Encounter (Signed)
Pt states she has had lower right side back pain since yesterday, using otc pain meds, which arent helping.  She is asking for pain medicine to be called to pharmacy.  I advised her that she would need to be seen before pain medicine can be called in.  She says she cant get out of bed.  She says pain is on lower right side of back but radiates up into the chest.  Again advised that she would need to be seen before narcotic is called in.

## 2010-09-09 NOTE — Telephone Encounter (Signed)
I agree, she needs to be seen.  

## 2010-09-09 NOTE — Telephone Encounter (Signed)
Noted, I'll await Er noted.

## 2010-09-09 NOTE — Telephone Encounter (Signed)
Called patient and offered her a 3:15 same day appt today, she stated that she cannot walk or drive and she has three children there with her.  I advised patient that if she couldn't walk or drive then she probably needs to be evaluated at the ER.  Patient stated that she was going to arrange for someone to take her to the ER today.

## 2010-09-11 LAB — URINE CULTURE
Colony Count: 55000
Culture  Setup Time: 201208101623

## 2010-09-13 ENCOUNTER — Encounter: Payer: Self-pay | Admitting: Family Medicine

## 2010-09-13 ENCOUNTER — Ambulatory Visit (INDEPENDENT_AMBULATORY_CARE_PROVIDER_SITE_OTHER): Payer: 59 | Admitting: Family Medicine

## 2010-09-13 VITALS — BP 110/74 | HR 88 | Temp 98.3°F | Ht 64.5 in | Wt 226.8 lb

## 2010-09-13 DIAGNOSIS — M549 Dorsalgia, unspecified: Secondary | ICD-10-CM

## 2010-09-13 MED ORDER — TRAMADOL HCL 50 MG PO TABS: 50.0000 mg | ORAL_TABLET | Freq: Four times a day (QID) | ORAL | Status: DC | PRN

## 2010-09-13 MED ORDER — CYCLOBENZAPRINE HCL 10 MG PO TABS: 5.0000 mg | ORAL_TABLET | Freq: Three times a day (TID) | ORAL | Status: DC | PRN

## 2010-09-13 NOTE — Patient Instructions (Signed)
See Shirlee Limerick about your referral before your leave today. Take 1/2 of the flexeril tabs and see if that helps with the sleepiness.   Take care.

## 2010-09-13 NOTE — Progress Notes (Signed)
Back pain.  Was taking pamperin, midol for what she thought was pain due to menses.  After that was lifting a grill on her deck.  About 1 hour later she started having pain in abdomen, back and chest.  Took some ibuprofen.  Was having to use a bed pain because she couldn't walk to BR.  Went to ER for pain.  ER recs reviewed.  Pain is better now, 3/10 with pain meds.  No ADE from meds.  No FCNAVD.  Able to walk now with the medicine.  No renal stone seen.    Initially pain was lower paraspinal area, now with more pain on the superior/lateral pelvis.  Still with L>R radicular sx while walking; better with meds.    She had seen Dr. Myra Rude and had MRI done but didn't have surgical problem that needed intervention.    She has h/o fibroids and didn't know if this was related.    +gallstones.  We talked about this today.  This was an incidental finding on imaging.    Meds, vitals, and allergies reviewed.   ROS: See HPI.  Otherwise, noncontributory.  nad ncat Mmm rrr ctab back w/o midline pain B paraspinal muscles ttp and L SLR positive.  Distally nv intact Able to bear weight.

## 2010-09-15 DIAGNOSIS — M549 Dorsalgia, unspecified: Secondary | ICD-10-CM | POA: Insufficient documentation

## 2010-09-15 NOTE — Assessment & Plan Note (Addendum)
Improved with meds, nontoxic, okay for outpatient f/u.  Refer to PT and cut back on flexeril for sedation.  She agrees.  No indication for repeat imaging today and this was d/w pt.

## 2010-09-20 ENCOUNTER — Ambulatory Visit (HOSPITAL_COMMUNITY)
Admission: RE | Admit: 2010-09-20 | Discharge: 2010-09-20 | Disposition: A | Discharge status: Home / Self Care | Payer: 59 | Source: Ambulatory Visit | Attending: Family Medicine | Admitting: Family Medicine

## 2010-09-20 DIAGNOSIS — R262 Difficulty in walking, not elsewhere classified: Secondary | ICD-10-CM | POA: Insufficient documentation

## 2010-09-20 DIAGNOSIS — M6281 Muscle weakness (generalized): Secondary | ICD-10-CM | POA: Insufficient documentation

## 2010-09-20 DIAGNOSIS — M545 Low back pain, unspecified: Secondary | ICD-10-CM | POA: Insufficient documentation

## 2010-09-20 DIAGNOSIS — IMO0001 Reserved for inherently not codable concepts without codable children: Secondary | ICD-10-CM | POA: Insufficient documentation

## 2010-09-20 NOTE — Progress Notes (Addendum)
Physical Therapy Evaluation  Patient Details  Name: FLORAINE BUECHLER MRN: [id number] Date of Birth: [birth year]  Today's Date: 09/20/2010 Time: 5784-6962 Time Calculation (min): 43 min Charges: 1 eval, 10 min TE Visit#: 1 of 12 Re-eval: 10/18/10  Past Medical History:  Past Medical History  Diagnosis Date  . Other symptoms involving digestive system   . Headache   . Other specified cardiac dysrhythmias   . Abdominal  pain, other specified site   . Other constipation   . Anxiety state, unspecified    Past Surgical History:  Past Surgical History  Procedure Date  . Vaginal delivery 90, 97, 99, 03    x 4   . Hernia repair   . Tubal ligation   . Cervical disc surgery     Ruptured disc repair / and approach c5/6, c6/7  . Cardiac catheterization 12/09/03    Normal    Subjective Symptoms/Limitations Symptoms: Pt reports that she has had low back pain since last Thursday.  She reports she was doing some outside housework and she started to have a pain in her stomach which then radiated into her back.  She took some medication which did not help at all.  She reports that the next morning she tried to get up and could not walk well.  She reports she took a flexiril and called EMS and was admitted and was given higher of medication and did not help the pain go away.  X-rays negative and negative for passing gallstones.  Pt came back to work last Friday.  C/co is increased pain and difficulty moving.  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  Pt reports she has a history of back pain.  Has not recieved therapy for her back before.  Range of pain: 1-5/10.   How long can you sit comfortably?: about an hour, has most difficulty with driving, has some numbness How long can you stand comfortably?: 4-5 hours and mentions decreased endurance.   How long can you walk comfortably?: 30 minutes, PLOF was 45 minutes for exercise Special Tests: + L SLR, + Bilateral FABER, + B 90/90 HS,  Pain Assessment Currently in  Pain?: Yes Pain Score:   2 Pain Location: Back Pain Orientation: Right;Left;Distal Pain Type: Acute pain Pain Radiating Towards: Left Leg Pain Onset: In the past 7 days Pain Frequency: Constant Pain Relieving Factors: Aggrevating: Decreased motion, Alleviating: Increased walking and movement and ibuprofen.   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   Multiple Pain Sites: Yes  Sensation/Coordination/Flexibility  Mild decrease in sensation to LLE  Assessment RLE Strength Right Hip Flexion: 4/5 Right Hip Extension: 3/5 Right Hip ABduction: 4/5 Right Hip ADduction: 4/5 Right Knee Flexion: 5/5 Right Knee Extension: 5/5 LLE Strength Left Hip Flexion: 4/5 Left Hip Extension: 3+/5 Left Hip ABduction: 4/5 Left Hip ADduction: 4/5 Left Knee Flexion: 5/5 Left Knee Extension: 5/5 Lumbar AROM Lumbar Flexion: WFL Lumbar Extension: 10 degrees Lumbar - Right Side Bend: 10 cm Lumbar - Left Side Bend: 7 cm Lumbar - Right Rotation: decreased 50%  Lumbar - Left Rotation: decreased 50% Lumbar Strength Lumbar Flexion: 3/5 Lumbar Extension: 3/5  Mobility (including Balance)    Balance Balance Assessed: Yes Static Standing Balance Single Leg Stance - Right Leg: 10  Single Leg Stance - Left Leg: 10  Tandem Stance - Right Leg: 10  Tandem Stance -  Left Leg: 10  Rhomberg - Eyes Opened: 10  Rhomberg - Eyes Closed: 10   Exercise/Treatments Stretches Active Hamstring Stretch: 30 seconds Single Knee to Chest Stretch: 30 seconds Lower Trunk Rotation: 5 reps Stability Bridge: 10 reps  Physical Therapy Assessment and Plan PT Assessment and Plan Clinical Impression Statement: Pt is a 45 y.o. female  referred to PT for LBP.  After examination it was found that the patient has current body structure impairments including: increased pain with  radicular pain to left leg, significant gluteal and spinal spasms, decreased core strength, decreased flexibility, and impaired spinal alignment which are limiting her ability to participate in community and work activities and functions. Pt will benefit from skilled PT service to address the above body structure impairments in order to maximize function in order to improve quality of life. Rehab Potential: Good PT Frequency: Min 3X/week PT Duration: 4 weeks PT Treatment/Interventions: Stair training;Gait training;Functional mobility training;Therapeutic exercise;Neuromuscular re-education;Patient/family education;Other (comment) (Manual and modalites for pain control and alignment) PT Plan: MET to correct SI alignment, TM walking, Ab sets.      Goals PT Short Term Goals Time to Complete Goals: 4 weeks PT Short Term Goal 1: 1.Pt will be Independent in HEP in order to maximize therapeutic effect. PT Short Term Goal 1 - Progress: Met PT Short Term Goal 2: 2.Pt will report pain less than 3/10 for 50% of her day while standing PT Short Term Goal 2 - Progress: Met PT Short Term Goal 3: 3.Pt will have minimal spasm to core and gluteal region. PT Short Term Goal 4: 4.Pt will increase core strength by 1 muscle grade. PT Short Term Goal 4 - Progress: Progressing toward goal PT Long Term Goals PT Long Term Goal 1: 1.Pt will report pain less than or equal to 2/10 for 75% of her day for improved quality of life. PT Long Term Goal 1 - Progress: Progressing toward goal PT Long Term Goal 2: 2.Pt will improve core strength to WNL order to ambulate for 45 minutes in order to continue with exercise activities. 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  Long Term Goal 3 Progress: Progressing toward goal Long Term Goal 4: 4.Pt will present with lumbar AROM and LE flexibility  WNL in order to tolerate sitting in a car for over an hour. Long Term Goal 4 Progress: Met  Problem List Patient Active Problem List  Diagnoses  . HYPERCHOLESTEROLEMIA  . ANXIETY, MILD  . ADJUSTMENT REACTION WITH PHYSICAL SYMPTOMS  . SUPRAVENTRICULAR TACHYCARDIA  . CHRONIC RHINITIS  . CONSTIPATION, CHRONIC  . ECZEMA  . SKIN LESION, BENIGN  . HEADACHE, CHRONIC  . GERD (gastroesophageal reflux disease)  . Back pain  . Low back pain    PT - End of Session Activity Tolerance: Patient tolerated treatment well   Dajai Wahlert 09/20/2010, 9:39 AM  Physician Documentation Your signature is required to indicate approval of the treatment plan as stated above.  Please sign and either send electronically or make a copy of this report for your files and return this physician signed original.   Please mark one 1.__approve of plan  2. ___approve of plan with the following conditions.   ______________________________  _____________________ Physician Signature                                                                                                             Date

## 2010-09-23 ENCOUNTER — Ambulatory Visit (HOSPITAL_COMMUNITY)
Admission: RE | Admit: 2010-09-23 | Discharge: 2010-09-23 | Disposition: A | Discharge status: Home / Self Care | Payer: 59 | Source: Ambulatory Visit

## 2010-09-23 NOTE — Progress Notes (Signed)
Physical Therapy Treatment Patient Details  Name: ELYNN PATTESON MRN: [id number] Date of Birth: 24-May-1965  Today's Date: 09/23/2010 Time: 1478-2956 Time Calculation (min): 50 min Visit#: 2 of 12 Re-eval: 10/18/10 Charge: Manual x 10 min Therex 35 min  Subjective: Symptoms/Limitations Symptoms: 2/10 LBP. Pain Assessment Currently in Pain?: Yes Pain Score:   2 Pain Location: Back Pain Orientation: Lower  Objective: R SI outflare, pt with increased pain with posterior pelvic tilt.  Exercise/Treatments Stretches Active Hamstring Stretch: 3 reps;30 seconds Single Knee to Chest Stretch: 3 reps;30 seconds Lower Trunk Rotation: 5 reps;10 seconds Lumbar Exercises   Stability Bridge: 10 reps;Limitations (R LE) Bridge Limitations:  (R only) Straight Leg Raise: 10 reps;Limitations (L only) Machine Exercises    Manual Therapy Manual Therapy:  (MET R outflare)  Physical Therapy Assessment and Plan PT Assessment and Plan Clinical Impression Statement: Pt with R SI outflare, MET complete with increased ROM and decreased pain following technique.   PT Plan: Continue with current POC, progress core strength/stability.    Goals    Problem List Patient Active Problem List  Diagnoses  . HYPERCHOLESTEROLEMIA  . ANXIETY, MILD  . ADJUSTMENT REACTION WITH PHYSICAL SYMPTOMS  . SUPRAVENTRICULAR TACHYCARDIA  . CHRONIC RHINITIS  . CONSTIPATION, CHRONIC  . ECZEMA  . SKIN LESION, BENIGN  . HEADACHE, CHRONIC  . GERD (gastroesophageal reflux disease)  . Back pain  . Low back pain    PT - End of Session Activity Tolerance: Patient tolerated treatment well General Behavior During Session: [facility] for tasks performed Cognition: [facility] for tasks performed  Juel Burrow 09/23/2010, 6:58 PM

## 2010-09-26 ENCOUNTER — Ambulatory Visit (HOSPITAL_COMMUNITY)
Admission: RE | Admit: 2010-09-26 | Discharge: 2010-09-26 | Disposition: A | Discharge status: Home / Self Care | Payer: 59 | Source: Ambulatory Visit | Attending: Physical Therapy | Admitting: Physical Therapy

## 2010-09-26 NOTE — Progress Notes (Signed)
Physical Therapy Treatment Patient Details  Name: [name] MRN: [id number] Date of Birth: 1965/06/03  Today's Date: 09/26/2010 Time: 409-811   Charges: 55' man, 12' TE Visit#: 3 of 12 Re-eval: 10/18/10  Subjective: Symptoms/Limitations Symptoms: I am feeling pretty good.  Pain 1/10 to her low back.  She reports that she has not had to work yet.  first day back will be on Friday.  Pain Assessment Pain Score:   1 Pain Location: Back   Exercise/Treatments Stretches Supine:  BLE Active Hamstring Stretch: 3 reps;30 seconds  BLE Piriformis Stretch: 3 reps;30 seconds  Clam: Supine;10 reps;Alternating w/ab sets  Ab Set: 10 reps;5 seconds;Supine Seated:  Piriformis Stretch 3x30 sec Stability   Standing:   Hip flexor stretch 30 sec BLE  Education on proper use of lumbar roll  Physical Therapy Assessment and Plan    Pt had decreased muscular spasm with manual techniques and was able to maintain after ther-ex. Pt continues to make gains with flexibility and lumbar ROM. She was able to maintain alignment from last session.   PLAN: Cont to progress core ther-ex including bent knee raises, sidelying clams, TM walking  Goals    Problem List Patient Active Problem List  Diagnoses  . HYPERCHOLESTEROLEMIA  . ANXIETY, MILD  . ADJUSTMENT REACTION WITH PHYSICAL SYMPTOMS  . SUPRAVENTRICULAR TACHYCARDIA  . CHRONIC RHINITIS  . CONSTIPATION, CHRONIC  . ECZEMA  . SKIN LESION, BENIGN  . HEADACHE, CHRONIC  . GERD (gastroesophageal reflux disease)  . Back pain  . Low back pain       Fount Bahe 09/26/2010, 12:37 PM

## 2010-09-27 ENCOUNTER — Ambulatory Visit (HOSPITAL_COMMUNITY): Payer: 59 | Admitting: Physical Therapy

## 2010-09-30 ENCOUNTER — Inpatient Hospital Stay (HOSPITAL_COMMUNITY): Admission: RE | Admit: 2010-09-30 | Payer: 59 | Source: Ambulatory Visit

## 2010-10-06 ENCOUNTER — Ambulatory Visit (HOSPITAL_COMMUNITY)
Admission: RE | Admit: 2010-10-06 | Discharge: 2010-10-06 | Disposition: A | Discharge status: Home / Self Care | Payer: 59 | Source: Ambulatory Visit | Attending: Family Medicine | Admitting: Family Medicine

## 2010-10-06 DIAGNOSIS — M545 Low back pain, unspecified: Secondary | ICD-10-CM | POA: Insufficient documentation

## 2010-10-06 DIAGNOSIS — IMO0001 Reserved for inherently not codable concepts without codable children: Secondary | ICD-10-CM | POA: Insufficient documentation

## 2010-10-06 DIAGNOSIS — R262 Difficulty in walking, not elsewhere classified: Secondary | ICD-10-CM | POA: Insufficient documentation

## 2010-10-06 DIAGNOSIS — M6281 Muscle weakness (generalized): Secondary | ICD-10-CM | POA: Insufficient documentation

## 2010-10-06 NOTE — Progress Notes (Signed)
Physical Therapy Treatment Patient Details  Name: ANALEIA ISMAEL MRN: [id number] Date of Birth: 19-Apr-1965  Today's Date: 10/06/2010 Time: 4098-1191 Time Calculation (min): 46 min Visit#:4 of 12 Re-eval: 10/18/10  Subjective: Symptoms/Limitations Symptoms: My back feels tired today Pain Assessment Pain Score:   3 Pain Location: Back  Precautions/Restrictions     Mobility (including Balance)       Exercise/Treatments Stretches Active Hamstring Stretch: 3 reps;30 seconds;Limitations Active Hamstring Stretch Limitations: BLE Quadruped Mid Back Stretch: 3 reps;30 seconds (Child's pose) ITB Stretch: 3 reps;30 seconds;Limitations ITB Stretch Limitations: BLE Piriformis Stretch: 3 reps;30 seconds;Limitations Piriformis Stretch Limitations: BLE  Lumbar Exercises   Stability  completed prone heel squeeze,prone SLR;SL Clams; supine bent knee raise, bridge and isometric hip flexion. Machine Exercises  TM 1.5 mph for 7'     Physical Therapy Assessment and Plan PT Assessment and Plan Clinical Impression Statement: Progressed from supine to sidelying clam with manual assistance for proper tech.  Pt continues to make gains in flexibilty and lumbar ROM.  Pain 0/10 at end of session.  Becky Sax, PTA ended session for last 22 min, PT Plan: Continue to progress core/lumbar strengthening/stability/flexibiility.    Goals    Problem List Patient Active Problem List  Diagnoses  . HYPERCHOLESTEROLEMIA  . ANXIETY, MILD  . ADJUSTMENT REACTION WITH PHYSICAL SYMPTOMS  . SUPRAVENTRICULAR TACHYCARDIA  . CHRONIC RHINITIS  . CONSTIPATION, CHRONIC  . ECZEMA  . SKIN LESION, BENIGN  . HEADACHE, CHRONIC  . GERD (gastroesophageal reflux disease)  . Back pain  . Low back pain    PT - End of Session Activity Tolerance: Patient tolerated treatment well General Behavior During Session: [facility] for tasks performed Cognition: [facility] for tasks performed  RUSSELL,CINDY 10/06/2010, 1:42  PM

## 2010-10-07 ENCOUNTER — Ambulatory Visit (HOSPITAL_COMMUNITY)
Admission: RE | Admit: 2010-10-07 | Discharge: 2010-10-07 | Disposition: A | Discharge status: Home / Self Care | Payer: 59 | Source: Ambulatory Visit

## 2010-10-07 NOTE — Progress Notes (Signed)
Physical Therapy Treatment Patient Details  Name: [name] MRN: [id number] Date of Birth: 1965/11/03  Today's Date: 10/07/2010 Time: 1730-1830 Time Calculation (min): 60 min Visit#: 5 of 8 Re-eval: 10/18/10 Charge: therex 50 min  Subjective: Symptoms/Limitations Symptoms: My backs sore today, maybe a 1/10. Pain Assessment Currently in Pain?: Yes Pain Score:   1 Pain Location: Back  Precautions/Restrictions     Mobility (including Balance)       Exercise/Treatments  Aerobic/Warm up TM 2.0  mph for 7' Prone: Heel squeezes 5x 5" SLR 10x 5" SAR 5x 5 Opp A/ L 3x 5" Child's pose Sidelying: Clams 10 x Supine:  Bent knee raises 10x Bridge 10x Iso hip flexion 10x Piriformis 3x 30" IT Band 3x 30" HS St 3x 30" Standing:  Hip flexor stretch 3x 30 sec BLE Functional squats 10 reps  Physical Therapy Assessment and Plan PT Assessment and Plan Clinical Impression Statement: Advanced prone ex with SAR, Opp A/L with min cueing for form/tech.  Pt continues to make gains in flexibility and lumbar ROM, and reduced pain.  Pain 0/10 at end of session. PT Plan: Continue progressing core/lumbar strength/stability and flexibility..    Goals    Problem List Patient Active Problem List  Diagnoses  . HYPERCHOLESTEROLEMIA  . ANXIETY, MILD  . ADJUSTMENT REACTION WITH PHYSICAL SYMPTOMS  . SUPRAVENTRICULAR TACHYCARDIA  . CHRONIC RHINITIS  . CONSTIPATION, CHRONIC  . ECZEMA  . SKIN LESION, BENIGN  . HEADACHE, CHRONIC  . GERD (gastroesophageal reflux disease)  . Back pain  . Low back pain    PT - End of Session Activity Tolerance: Patient tolerated treatment well General Behavior During Session: [facility] for tasks performed Cognition: [facility] for tasks performed  Juel Burrow 10/07/2010, 6:44 PM

## 2010-10-11 ENCOUNTER — Ambulatory Visit (HOSPITAL_COMMUNITY)
Admission: RE | Admit: 2010-10-11 | Discharge: 2010-10-11 | Disposition: A | Discharge status: Home / Self Care | Payer: 59 | Source: Ambulatory Visit | Attending: Family Medicine | Admitting: Family Medicine

## 2010-10-11 NOTE — Progress Notes (Signed)
Physical Therapy Treatment Patient Details  Name: [name] MRN: [id number] Date of Birth: July 23, 1965  Today's Date: 10/11/2010 Time: 9147-8295 Time Calculation (min): 40 min Visit#: 6 of 8 Re-eval: 10/18/10 Charges:  Therex 38'  Subjective: Symptoms/Limitations Symptoms: Pt tired, just finished working rated LBP dull 2/10. Pain Assessment Currently in Pain?: Yes Pain Score:   2 Pain Location: Back   Exercise/Treatments Aerobic/Warm up  TM 2.0 mph for 7'  Prone:  Heel squeezes 10x 5"  SLR 10x 5"  SAR 10x 5"  Opp A/ L 5x   Child's pose  Sidelying:  Clams 10 x  Hip Adduction 10X Hip Abduction 10X Supine:  Bent knee raises 10x  Bridge 10x  Iso hip flexion 10x  Piriformis 3x 30"  IT Band 3x 30"  HS St 3x 30"  Standing:  Hip flexor stretch 3x 30 sec BLE  Functional squats 10 reps     Physical Therapy Assessment and Plan PT Assessment and Plan Clinical Impression Statement: improving stability with improved form and decreased cues with therex.  Pt. has met 2 of her STG's. PT Treatment/Interventions: Therapeutic exercise PT Plan: Add isometric ball abdominal therex and increase reps next visit.  Progress to body mechanics.    Goals PT Short Term Goals Time to Complete Goals: 4 weeks PT Short Term Goal 1: 1.Pt will be Independent in HEP in order to maximize therapeutic effect. PT Short Term Goal 1 - Progress: Met PT Short Term Goal 2: 2.Pt will report pain less than 3/10 for 50% of her day while standing PT Short Term Goal 2 - Progress: Met PT Short Term Goal 3: 3.Pt will have minimal spasm to core and gluteal region. PT Short Term Goal 3 - Progress: Progressing toward goal PT Short Term Goal 4: 4.Pt will increase core strength by 1 muscle grade. PT Short Term Goal 4 - Progress: Progressing toward goal PT Long Term Goals PT Long Term Goal 1: 1.Pt will report pain less than or equal to 2/10 for 75% of her day for improved quality of life. PT Long Term Goal  1 - Progress: Progressing toward goal PT Long Term Goal 2: 2.Pt will improve core strength to WNL order to ambulate for 45 minutes in order to continue with exercise activities. 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  Long Term Goal 3 Progress: Progressing toward goal Long Term Goal 4: 4.Pt will present with lumbar AROM and LE flexibility WNL in order to tolerate sitting in a car for over an hour.  Problem List Patient Active Problem List  Diagnoses  . HYPERCHOLESTEROLEMIA  . ANXIETY, MILD  . ADJUSTMENT REACTION WITH PHYSICAL SYMPTOMS  . SUPRAVENTRICULAR TACHYCARDIA  . CHRONIC RHINITIS  . CONSTIPATION, CHRONIC  . ECZEMA  . SKIN LESION, BENIGN  . HEADACHE, CHRONIC  . GERD (gastroesophageal reflux disease)  . Back pain  . Low back pain    PT - End of Session Activity Tolerance: Patient tolerated treatment well General Behavior During Session: [facility] for tasks performed Cognition: [facility] for tasks performed  Emeline Gins B 10/11/2010, 9:32 AM

## 2010-10-19 ENCOUNTER — Inpatient Hospital Stay (HOSPITAL_COMMUNITY): Admission: RE | Admit: 2010-10-19 | Payer: 59 | Source: Ambulatory Visit

## 2010-10-19 ENCOUNTER — Telehealth (HOSPITAL_COMMUNITY): Payer: Self-pay

## 2010-10-21 ENCOUNTER — Ambulatory Visit (HOSPITAL_COMMUNITY)
Admission: RE | Admit: 2010-10-21 | Discharge: 2010-10-21 | Disposition: A | Discharge status: Home / Self Care | Payer: 59 | Source: Ambulatory Visit

## 2010-10-21 NOTE — Progress Notes (Signed)
Physical Therapy Treatment Patient Details  Name: [name] MRN: [id number] Date of Birth: Mar 07, 1965  Today's Date: 10/21/2010 Time: 5621-3086 Time Calculation (min): 30 min Visit#: 7  of 12   Re-eval: 10/18/10    Subjective: Symptoms/Limitations Symptoms: Pain free all week, took pain meds on Monday but no pain/ no meds all week. Pain Assessment Currently in Pain?: No/denies  Objective:  Senai Ramnath Re-eval 10/21/2010  ()= initial eval 09/20/2010  MMT Action Right Left  Hip flexion 5/5 (4/5) 5/5 (4/5)  Hip extension 5/5 (3/5) 4+/5 (3+/5)  Hip Abduction 5/5 (4/5) 5/5 (4/5)  Hip Adduction 5/5 (4/5) 5/5 (4/5)  Lumbar flexion 5/5 (3/5)   Lumbar extension 5/5 (3/5)   ROM    Lumbar extension WNL (10 degrees)   R SB WNL ( 10 cm)   L SB  WNL (7 cm)   R Rot WNL ( decreased 50) degrees   L Rot WNL ( decreased 50) degrees     Exercise/Treatments  Physical Therapy Assessment and Plan PT Assessment and Plan Clinical Impression Statement: Re-eval complete, all strength and ROM WNL.  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  All other goals have been met.  PT Plan: D/C to HEP    Goals PT Short Term Goals PT Short Term Goal 1 - Progress: Met PT Short Term Goal 2 - Progress: Met PT Short Term Goal 3 - Progress: Met PT Short Term Goal 4 - Progress: Met PT Long Term Goals PT Long Term Goal 1 - Progress: Met PT Long Term Goal 2 - Progress: Met Long Term Goal 3 Progress: Progressing toward goal Long Term Goal 4 Progress: Met  Problem List Patient Active Problem List  Diagnoses  . HYPERCHOLESTEROLEMIA  . ANXIETY, MILD  . ADJUSTMENT REACTION WITH PHYSICAL SYMPTOMS  . SUPRAVENTRICULAR TACHYCARDIA  . CHRONIC RHINITIS  . CONSTIPATION, CHRONIC  . ECZEMA  . SKIN LESION, BENIGN  . HEADACHE, CHRONIC  . GERD (gastroesophageal reflux disease)  . Back pain  . Low back pain    PT - End of Session Activity  Tolerance: Patient tolerated treatment well General Behavior During Session: [facility] for tasks performed Cognition: [facility] for tasks performed  Juel Burrow 10/21/2010, 6:17 PM

## 2010-12-16 ENCOUNTER — Encounter: Payer: Self-pay | Admitting: Family Medicine

## 2010-12-16 ENCOUNTER — Ambulatory Visit (INDEPENDENT_AMBULATORY_CARE_PROVIDER_SITE_OTHER): Payer: 59 | Admitting: Family Medicine

## 2010-12-16 ENCOUNTER — Encounter: Payer: Self-pay | Admitting: *Deleted

## 2010-12-16 VITALS — BP 118/78 | HR 72 | Temp 98.4°F | Wt 223.8 lb

## 2010-12-16 DIAGNOSIS — J029 Acute pharyngitis, unspecified: Secondary | ICD-10-CM

## 2010-12-16 LAB — POCT RAPID STREP A (OFFICE): Rapid Strep A Screen: NEGATIVE

## 2010-12-16 MED ORDER — AMOXICILLIN 875 MG PO TABS: 875.0000 mg | ORAL_TABLET | Freq: Two times a day (BID) | ORAL | Status: AC

## 2010-12-16 NOTE — Assessment & Plan Note (Addendum)
RST neg. sxs of 4d duration however associated with large right tonsillolith. Will treat with 10d course of amoxicillin. Update [place] if not improving. Continue to push fluids and rest.

## 2010-12-16 NOTE — Progress Notes (Signed)
  Subjective:    Patient ID: [name], female    DOB: [birth year], 45 y.o.   MRN: [id number]  HPI CC: ?sinus infection  Sxs started 4 days ago.  Entire mouth feels sore, even tongue.  Minimal cough.  + sinus drainage that looks brown/bloody.  Feels feverish at night.  Early on had myalgia.  Progressive sxs.  + mild SOB.  Significant congestion.  No RN.  Taking pseudophed, ibuprofen and mucinex with plenty of fluid for chest congestion.  No fevers/chills, abd pain, n/v/d.  No chest pain.  No ear pain or tooth pain.  No sick contacts at home.  No smoker sat home.  No h/o asthma, COPD, seasonal allergies.  Review of Systems Per HPI    Objective:   Physical Exam  Nursing note and vitals reviewed. Constitutional: She appears well-developed and well-nourished. No distress.  HENT:  Head: Normocephalic and atraumatic.  Right Ear: Hearing, tympanic membrane, external ear and ear canal normal.  Left Ear: Hearing, tympanic membrane, external ear and ear canal normal.  Nose: Mucosal edema present. No rhinorrhea. Right sinus exhibits no maxillary sinus tenderness and no frontal sinus tenderness. Left sinus exhibits no maxillary sinus tenderness and no frontal sinus tenderness.  Mouth/Throat: Uvula is midline and mucous membranes are normal. Posterior oropharyngeal erythema present. No oropharyngeal exudate, posterior oropharyngeal edema or tonsillar abscesses.       L>R turbinate swelling Right large tonsillolith No halitosis  Eyes: Conjunctivae and EOM are normal. Pupils are equal, round, and reactive to light. No scleral icterus.  Neck: Normal range of motion. Neck supple.  Cardiovascular: Normal rate, regular rhythm, normal heart sounds and intact distal pulses.   No murmur heard. Pulmonary/Chest: Effort normal and breath sounds normal. No respiratory distress. She has no wheezes. She has no rales.  Lymphadenopathy:    She has no cervical adenopathy.  Skin: Skin is warm and dry. No  rash noted.       Assessment & Plan:

## 2010-12-16 NOTE — Patient Instructions (Addendum)
You did have large tonsillolith on exam today - will treat you with 10 day course of amoxicillin. The other symptoms may be due to viral upper respiratory infection.  Push fluids and rest.  Continue other medicines as up to now. Consider nasal saline irrigation 2-3 times /day. Call us if not improving as expected or any questions.

## 2011-01-12 ENCOUNTER — Ambulatory Visit: Payer: 59 | Admitting: Family Medicine

## 2011-01-25 ENCOUNTER — Ambulatory Visit (INDEPENDENT_AMBULATORY_CARE_PROVIDER_SITE_OTHER): Payer: 59 | Admitting: *Deleted

## 2011-01-25 DIAGNOSIS — Z23 Encounter for immunization: Secondary | ICD-10-CM

## 2011-01-26 ENCOUNTER — Encounter: Payer: Self-pay | Admitting: Family Medicine

## 2011-01-26 ENCOUNTER — Ambulatory Visit (INDEPENDENT_AMBULATORY_CARE_PROVIDER_SITE_OTHER): Payer: 59 | Admitting: Family Medicine

## 2011-01-26 DIAGNOSIS — E7800 Pure hypercholesterolemia, unspecified: Secondary | ICD-10-CM

## 2011-01-26 DIAGNOSIS — R17 Unspecified jaundice: Secondary | ICD-10-CM

## 2011-01-26 DIAGNOSIS — I498 Other specified cardiac arrhythmias: Secondary | ICD-10-CM

## 2011-01-26 DIAGNOSIS — M545 Low back pain, unspecified: Secondary | ICD-10-CM

## 2011-01-26 DIAGNOSIS — K219 Gastro-esophageal reflux disease without esophagitis: Secondary | ICD-10-CM

## 2011-01-26 DIAGNOSIS — R51 Headache: Secondary | ICD-10-CM

## 2011-01-26 LAB — CBC WITH DIFFERENTIAL/PLATELET
Basophils Absolute: 0 10*3/uL (ref 0.0–0.1)
Basophils Relative: 0.3 % (ref 0.0–3.0)
Eosinophils Absolute: 0 10*3/uL (ref 0.0–0.7)
Eosinophils Relative: 0.5 % (ref 0.0–5.0)
HCT: 36.3 % (ref 36.0–46.0)
Hemoglobin: 12.2 g/dL (ref 12.0–15.0)
Lymphocytes Relative: 38.9 % (ref 12.0–46.0)
Lymphs Abs: 3 10*3/uL (ref 0.7–4.0)
MCHC: 33.7 g/dL (ref 30.0–36.0)
MCV: 88.4 fl (ref 78.0–100.0)
Monocytes Absolute: 0.5 10*3/uL (ref 0.1–1.0)
Monocytes Relative: 6.2 % (ref 3.0–12.0)
Neutro Abs: 4.2 10*3/uL (ref 1.4–7.7)
Neutrophils Relative %: 54.1 % (ref 43.0–77.0)
Platelets: 341 10*3/uL (ref 150.0–400.0)
RBC: 4.11 Mil/uL (ref 3.87–5.11)
RDW: 15.1 % — ABNORMAL HIGH (ref 11.5–14.6)
WBC: 7.7 10*3/uL (ref 4.5–10.5)

## 2011-01-26 LAB — HEPATIC FUNCTION PANEL
AST: 21 U/L (ref 0–37)
Albumin: 3.7 g/dL (ref 3.5–5.2)
Alkaline Phosphatase: 65 U/L (ref 39–117)
Total Protein: 6.9 g/dL (ref 6.0–8.3)

## 2011-01-26 LAB — RENAL FUNCTION PANEL
Albumin: 3.7 g/dL (ref 3.5–5.2)
BUN: 13 mg/dL (ref 6–23)
Chloride: 107 mEq/L (ref 96–112)
Creatinine, Ser: 0.7 mg/dL (ref 0.4–1.2)
Phosphorus: 2.7 mg/dL (ref 2.3–4.6)

## 2011-01-26 LAB — LIPID PANEL: Cholesterol: 184 mg/dL (ref 0–200)

## 2011-01-26 NOTE — Progress Notes (Signed)
  Subjective:    Patient ID: [name], female    DOB: Sep 11, 1965, 45 y.o.   MRN: [id number]  HPI Pt of mine who is seen infrequently but goes irregularly to her Gyn for visits. She has been seen for acute visits in the last year by others but has not seen anyone regularly. She is now taking IBP regularly for her back and has had icterus of the eyes and wants to be checked. She last saw her Gyn in Mar, had Pap done then.     Review of Systems  Constitutional: Negative for fever, chills, diaphoresis, activity change and fatigue.  HENT: Negative for ear pain, congestion, rhinorrhea and postnasal drip.   Eyes: Negative for redness.  Respiratory: Negative for cough, chest tightness, shortness of breath and wheezing.   Cardiovascular: Negative for chest pain.  Musculoskeletal:       Back doing well, exercises regularly.       Objective:   Physical Exam  Constitutional: She appears well-developed and well-nourished. No distress.  HENT:  Head: Normocephalic and atraumatic.  Right Ear: External ear normal.  Left Ear: External ear normal.  Nose: Nose normal.  Mouth/Throat: Oropharynx is clear and moist. No oropharyngeal exudate.  Eyes: Conjunctivae and EOM are normal. Pupils are equal, round, and reactive to light.  Neck: Normal range of motion. Neck supple. No thyromegaly present.  Cardiovascular: Normal rate, regular rhythm and normal heart sounds.   Pulmonary/Chest: Effort normal and breath sounds normal. She has no wheezes. She has no rales.  Lymphadenopathy:    She has no cervical adenopathy.  Skin: She is not diaphoretic.          Assessment & Plan:

## 2011-01-26 NOTE — Assessment & Plan Note (Signed)
Has fasted today, so will check lipids. Will have pt to come back to discuss results with Dr Reece Agar.

## 2011-01-26 NOTE — Assessment & Plan Note (Signed)
Well controlled. Cont regular exercise.

## 2011-01-26 NOTE — Assessment & Plan Note (Signed)
Well controlled.  GERD can be treated with medication but also with lifestyle changes including avoidance of late meals, excess alcohol, smoking cessation, avoiding spicy foods, chocolate, peppermint, colas, red wine and  acidic juices such as orange or tomato juice. Do not take mint or menthol products, use sugarless candy instead (Jolly Ranchers)

## 2011-01-26 NOTE — Assessment & Plan Note (Signed)
Will screen liver functions. Poss Gilbert's presentation.

## 2011-01-26 NOTE — Patient Instructions (Signed)
RTC in one week to see Dr Sharen Hones.

## 2011-02-03 ENCOUNTER — Encounter: Payer: Self-pay | Admitting: Family Medicine

## 2011-02-03 ENCOUNTER — Ambulatory Visit (INDEPENDENT_AMBULATORY_CARE_PROVIDER_SITE_OTHER): Payer: 59 | Admitting: Family Medicine

## 2011-02-03 VITALS — BP 120/80 | HR 74 | Temp 97.9°F | Resp 20 | Ht 64.5 in | Wt 224.0 lb

## 2011-02-03 DIAGNOSIS — Z23 Encounter for immunization: Secondary | ICD-10-CM

## 2011-02-03 DIAGNOSIS — R17 Unspecified jaundice: Secondary | ICD-10-CM

## 2011-02-03 DIAGNOSIS — E7800 Pure hypercholesterolemia, unspecified: Secondary | ICD-10-CM

## 2011-02-03 NOTE — Assessment & Plan Note (Signed)
Resolved. Liver function improved today. Will ask her to return if returning sxs, reasonable to check acute hepatitis panel then.

## 2011-02-03 NOTE — Assessment & Plan Note (Signed)
Reviewed blood work with pt, stable.  At goal.

## 2011-02-03 NOTE — Patient Instructions (Signed)
Blood work returned overall normal. We will keep an eye on eyes and let me know if yellowing returning for further evaluation. Good to see you today, call us with questions.

## 2011-02-03 NOTE — Progress Notes (Signed)
  Subjective:    Patient ID: [name], female    DOB: [birth year], 46 y.o.   MRN: [id number]  HPI CC: 1 wk f/u blood work  Seen last week by Dr. Kathie Rhodes, blood work ordered.  Presents to go over results.  Overall feeling well, fighting cold.  Liver function checked because endorsed scleral icterus in am, improving recently.  Now resolved.  No abd pain, n/v, no h/o hepatitis.  Works as sleep disorder technologist.  Sees Dr. Emelda Fear at [facility] for well woman.  Tdap requested today for work.  Medications and allergies reviewed and updated in chart.  Past histories reviewed and updated if relevant as below. Patient Active Problem List  Diagnoses  . HYPERCHOLESTEROLEMIA  . ANXIETY, MILD  . ADJUSTMENT REACTION WITH PHYSICAL SYMPTOMS  . SUPRAVENTRICULAR TACHYCARDIA  . CHRONIC RHINITIS  . CONSTIPATION, CHRONIC  . ECZEMA  . SKIN LESION, BENIGN  . HEADACHE, CHRONIC  . GERD (gastroesophageal reflux disease)  . Back pain  . Low back pain  . Pharyngitis  . Scleral icterus   Past Medical History  Diagnosis Date  . Change in bowel function   . Headache   . Orthodromic reciprocating tachycardia   . Abdominal  pain, other specified site   . Other constipation   . Anxiety state, unspecified    Past Surgical History  Procedure Date  . Vaginal delivery 90, 97, 99, 03    x 4   . Hernia repair   . Tubal ligation   . Cervical disc surgery     Ruptured disc repair / and approach c5/6, c6/7  . Cardiac catheterization 12/09/03    Normal   History  Substance Use Topics  . Smoking status: Never Smoker   . Smokeless tobacco: Never Used  . Alcohol Use: No   Family History  Problem Relation Age of Onset  . Deep vein thrombosis Mother     crebral blood clot   . Heart failure Father   . Depression Maternal Uncle   . Diabetes Maternal Grandmother   . Stroke Maternal Grandmother   . Cancer Maternal Grandfather     Prostate   . Hypertension Neg Hx    Allergies  Allergen Reactions   . Sulfonamide Derivatives    Current Outpatient Prescriptions on File Prior to Visit  Medication Sig Dispense Refill  . cyclobenzaprine (FLEXERIL) 10 MG tablet Take 0.5-1 tablets (5-10 mg total) by mouth 3 (three) times daily as needed for muscle spasms.  30 tablet  2  . ibuprofen (ADVIL,MOTRIN) 600 MG tablet Take one by mouth three times a day as needed       . multivitamin (THERAGRAN) per tablet Take 1 tablet by mouth daily.        . traMADol (ULTRAM) 50 MG tablet Take 1-2 tablets (50-100 mg total) by mouth every 6 (six) hours as needed for pain. Do not exceed 400 mg per day  50 tablet  2    Review of Systems Per HPI    Objective:   Physical Exam  Nursing note and vitals reviewed. Constitutional: She appears well-developed and well-nourished. No distress.  HENT:  Head: Normocephalic and atraumatic.  Eyes: Conjunctivae and EOM are normal. Pupils are equal, round, and reactive to light. No scleral icterus.  Psychiatric: She has a normal mood and affect.       Assessment & Plan:

## 2011-07-03 ENCOUNTER — Emergency Department (HOSPITAL_COMMUNITY)
Admission: EM | Admit: 2011-07-03 | Discharge: 2011-07-03 | Disposition: A | Discharge status: Home / Self Care | Payer: 59 | Attending: Emergency Medicine | Admitting: Emergency Medicine

## 2011-07-03 ENCOUNTER — Emergency Department (HOSPITAL_COMMUNITY): Payer: 59

## 2011-07-03 ENCOUNTER — Encounter (HOSPITAL_COMMUNITY): Payer: Self-pay | Admitting: *Deleted

## 2011-07-03 DIAGNOSIS — M25519 Pain in unspecified shoulder: Secondary | ICD-10-CM | POA: Insufficient documentation

## 2011-07-03 DIAGNOSIS — R002 Palpitations: Secondary | ICD-10-CM | POA: Insufficient documentation

## 2011-07-03 DIAGNOSIS — I4510 Unspecified right bundle-branch block: Secondary | ICD-10-CM | POA: Insufficient documentation

## 2011-07-03 DIAGNOSIS — M542 Cervicalgia: Secondary | ICD-10-CM | POA: Insufficient documentation

## 2011-07-03 DIAGNOSIS — R110 Nausea: Secondary | ICD-10-CM | POA: Insufficient documentation

## 2011-07-03 DIAGNOSIS — R42 Dizziness and giddiness: Secondary | ICD-10-CM | POA: Insufficient documentation

## 2011-07-03 DIAGNOSIS — R55 Syncope and collapse: Secondary | ICD-10-CM | POA: Insufficient documentation

## 2011-07-03 LAB — URINE MICROSCOPIC-ADD ON

## 2011-07-03 LAB — URINALYSIS, ROUTINE W REFLEX MICROSCOPIC
Glucose, UA: NEGATIVE mg/dL
Specific Gravity, Urine: 1.011 (ref 1.005–1.030)
Urobilinogen, UA: 0.2 mg/dL (ref 0.0–1.0)
pH: 5.5 (ref 5.0–8.0)

## 2011-07-03 LAB — COMPREHENSIVE METABOLIC PANEL
ALT: 24 U/L (ref 0–35)
AST: 24 U/L (ref 0–37)
Albumin: 3.7 g/dL (ref 3.5–5.2)
Alkaline Phosphatase: 75 U/L (ref 39–117)
Chloride: 102 mEq/L (ref 96–112)
Potassium: 3 mEq/L — ABNORMAL LOW (ref 3.5–5.1)
Sodium: 136 mEq/L (ref 135–145)
Total Protein: 7.3 g/dL (ref 6.0–8.3)

## 2011-07-03 LAB — POCT PREGNANCY, URINE: Preg Test, Ur: NEGATIVE

## 2011-07-03 LAB — DIFFERENTIAL
Basophils Relative: 0 % (ref 0–1)
Eosinophils Absolute: 0 10*3/uL (ref 0.0–0.7)
Lymphs Abs: 1.9 10*3/uL (ref 0.7–4.0)
Neutro Abs: 6.5 10*3/uL (ref 1.7–7.7)
Neutrophils Relative %: 72 % (ref 43–77)

## 2011-07-03 LAB — CBC
MCH: 29.1 pg (ref 26.0–34.0)
MCHC: 34.2 g/dL (ref 30.0–36.0)
Platelets: 373 10*3/uL (ref 150–400)
RBC: 4.13 MIL/uL (ref 3.87–5.11)

## 2011-07-03 MED ORDER — ONDANSETRON HCL 4 MG/2ML IJ SOLN
INTRAMUSCULAR | Status: AC
  Administered 2011-07-03: 4 mg via INTRAVENOUS
  Filled 2011-07-03: qty 2

## 2011-07-03 MED ORDER — SODIUM CHLORIDE 0.9 % IV BOLUS (SEPSIS)
1000.0000 mL | Freq: Once | INTRAVENOUS | Status: AC
  Administered 2011-07-03: 1000 mL via INTRAVENOUS

## 2011-07-03 MED ORDER — LORAZEPAM 2 MG/ML IJ SOLN
1.0000 mg | Freq: Once | INTRAMUSCULAR | Status: AC
  Administered 2011-07-03: 1 mg via INTRAVENOUS
  Filled 2011-07-03: qty 1

## 2011-07-03 MED ORDER — PROMETHAZINE HCL 25 MG PO TABS: 25.0000 mg | ORAL_TABLET | Freq: Three times a day (TID) | ORAL | Status: DC | PRN

## 2011-07-03 MED ORDER — POTASSIUM CHLORIDE CRYS ER 20 MEQ PO TBCR
40.0000 meq | EXTENDED_RELEASE_TABLET | Freq: Once | ORAL | Status: AC
  Administered 2011-07-03: 40 meq via ORAL
  Filled 2011-07-03: qty 2

## 2011-07-03 MED ORDER — ONDANSETRON HCL 4 MG/2ML IJ SOLN
4.0000 mg | Freq: Once | INTRAMUSCULAR | Status: AC
  Administered 2011-07-03: 4 mg via INTRAVENOUS

## 2011-07-03 MED ORDER — KETOROLAC TROMETHAMINE 30 MG/ML IJ SOLN
30.0000 mg | Freq: Once | INTRAMUSCULAR | Status: AC
  Administered 2011-07-03: 30 mg via INTRAVENOUS
  Filled 2011-07-03: qty 1

## 2011-07-03 MED ORDER — METOCLOPRAMIDE HCL 5 MG/ML IJ SOLN
10.0000 mg | Freq: Once | INTRAMUSCULAR | Status: AC
  Administered 2011-07-03: 10 mg via INTRAVENOUS
  Filled 2011-07-03: qty 2

## 2011-07-03 MED ORDER — ONDANSETRON HCL 4 MG/2ML IJ SOLN
INTRAMUSCULAR | Status: AC
  Filled 2011-07-03: qty 2

## 2011-07-03 NOTE — Discharge Instructions (Signed)
Please make sure to followup as soon as possible with your physician via telephone.  It is important that you stop taking any dietary agents.  Please make sure to return to emergency department for any concerning changes in your condition.

## 2011-07-03 NOTE — ED Provider Notes (Signed)
History     CSN: [id number]  Arrival date & time 07/03/11  1009   First MD Initiated Contact with Patient 07/03/11 1029      Chief Complaint  Patient presents with  . Palpitations  . Near Syncope     HPI The patient presents after a near-syncopal episode.  She notes that after finishing work, overnight, she subacute he developed lightheadedness, nausea with pain about her left lateral neck and upper shoulder.  She subsequently developed chest tightness, which has subsided.  The patient's lightheadedness and nausea have improved mildly following aspirin and Zofran use.  She notes that prior to this event she was in her usual state of health and had not had a similar episode in some time.  She has a history of chronic neck pain.  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 Past Medical History  Diagnosis Date  . Other symptoms involving digestive system   . Headache   . Other specified cardiac dysrhythmias   . Abdominal  pain, other specified site   . Other constipation   . Anxiety state, unspecified     Past Surgical History  Procedure Date  . Vaginal delivery 90, 97, 99, 03    x 4   . Hernia repair   . Tubal ligation   . Cervical disc surgery     Ruptured disc repair / and approach c5/6, c6/7  . Cardiac catheterization 12/09/03    Normal    Family History  Problem Relation Age of Onset  . Deep vein thrombosis Mother     crebral blood clot   . Heart failure Father   . Depression Maternal Uncle   . Diabetes Maternal Grandmother   . Stroke Maternal Grandmother   . Cancer Maternal Grandfather     Prostate   . Hypertension Neg Hx     History  Substance Use Topics  . Smoking status: Never Smoker   . Smokeless tobacco: Never Used  . Alcohol Use: No    OB History    Grav Para Term Preterm Abortions TAB SAB Ect Mult Living                  Review of Systems  Constitutional:       HPI  HENT:       HPI otherwise  negative  Eyes: Negative.   Respiratory:       HPI, otherwise negative  Cardiovascular:       HPI, otherwise nmegative  Gastrointestinal: Positive for nausea and constipation. Negative for vomiting.  Genitourinary:       HPI, otherwise negative  Musculoskeletal:       HPI, otherwise negative  Skin: Negative.   Neurological: Positive for light-headedness and headaches. Negative for dizziness, seizures, syncope, facial asymmetry, speech difficulty and weakness.    Allergies  Sulfonamide derivatives  Home Medications   Current Outpatient Rx  Name Route Sig Dispense Refill  . IBUPROFEN 200 MG PO TABS Oral Take 400-600 mg by mouth every 6 (six) hours as needed. For pain/headache    . MULTIVITAMINS PO TABS Oral Take 1 tablet by mouth daily.        BP 113/89  Pulse 94  Temp(Src) 98.1 F (36.7 C) (Oral)  Resp 18  SpO2 100%  Physical Exam  Nursing note and vitals reviewed. Constitutional: She is oriented to person, place, and time. She appears well-developed and well-nourished. No distress.  Uncomfortable appearing female with a towel over her head  HENT:  Head: Normocephalic and atraumatic.  Eyes: Conjunctivae and EOM are normal.  Cardiovascular: Normal rate and regular rhythm.   Pulmonary/Chest: Effort normal and breath sounds normal. No stridor. No respiratory distress.  Abdominal: She exhibits no distension.  Musculoskeletal: She exhibits no edema.  Neurological: She is alert and oriented to person, place, and time. No cranial nerve deficit. She exhibits normal muscle tone. Coordination normal.  Skin: Skin is warm and dry.  Psychiatric: She has a normal mood and affect.    ED Course  Procedures (including critical care time)   Labs Reviewed  CBC  DIFFERENTIAL  COMPREHENSIVE METABOLIC PANEL  LIPASE, BLOOD  TROPONIN I  URINALYSIS, ROUTINE W REFLEX MICROSCOPIC   No results found.   No diagnosis found.  Cardiac: 91 sr, normal  Pulse ox 99% [place],  abnormal   Date: 07/03/2011  Rate: 86  Rhythm: normal sinus rhythm  QRS Axis: normal  Intervals: normal  ST/T Wave abnormalities: nonspecific ST/T changes  Conduction Disutrbances:right bundle branch block  Narrative Interpretation:   Old EKG Reviewed: unchanged Unchanged from 10/10/2010  1300: Patient is asleep.  Her companion notes that the patient had not previously revealed that she is currently taking dietary supplements, and is not eating.  This includes last night, prior to work when she took her supplements and did not eat throughout the night prior to the onset of symptoms. MDM  This 45 rolled female with a notable history of SVT now presents following an episode of nausea, with lightheadedness.  On my exam the patient is uncomfortable appearing, though in no distress.  The patient's vital signs are unremarkable, and there is no evidence of ongoing SVT.  The patient's labs are suggestive of hypokalemia.  Following EEG interventions the patient was sleeping, seemingly much more comfortable.  After the initial interview, the patient also endorsed using dietary supplements.  This episode may be secondary to SVT or medication reactions.  I counselled the patient on the need to abstain from further dietary supplements, and on the need to follow up with her primary care physician as soon as possible to insure appropriate ongoing care.      Gerhard Munch, MD 07/03/11 1319

## 2011-07-03 NOTE — ED Notes (Signed)
Per EMS pt from home, after waking up began to have heart palpitations, became dizzy/lightheaded, nauseated, chest tightness. Pt has hx of SVT. Near syncope episode. Pt sinus tach on EMS arrival at 115. Pt's chest tightness/pain subsided after EMS arrival. Pt given Aspirin 324 mg in route. Zofran 4 mg IVP. VSS. Alert and oriented x 3. IV 20G LAC.

## 2012-03-01 ENCOUNTER — Telehealth: Payer: Self-pay | Admitting: Family Medicine

## 2012-03-01 NOTE — Telephone Encounter (Signed)
[name] wants to change pcp from Sharen Hones to you. Please advise.

## 2012-03-01 NOTE — Telephone Encounter (Signed)
[name] wants to change pcp to Dr. Para March. Please advise.

## 2012-03-02 NOTE — Telephone Encounter (Signed)
Okay with me if okay with G.  Schedule next CPE with me.

## 2012-03-03 NOTE — Telephone Encounter (Signed)
Ok by me. Thanks.

## 2012-03-04 NOTE — Telephone Encounter (Signed)
Patient notified

## 2012-04-02 ENCOUNTER — Other Ambulatory Visit (HOSPITAL_COMMUNITY): Payer: Self-pay | Admitting: Obstetrics and Gynecology

## 2012-04-02 DIAGNOSIS — N644 Mastodynia: Secondary | ICD-10-CM

## 2012-04-17 ENCOUNTER — Ambulatory Visit (HOSPITAL_COMMUNITY)
Admission: RE | Admit: 2012-04-17 | Discharge: 2012-04-17 | Disposition: A | Discharge status: Home / Self Care | Payer: 59 | Source: Ambulatory Visit | Attending: Obstetrics and Gynecology | Admitting: Obstetrics and Gynecology

## 2012-04-17 DIAGNOSIS — N644 Mastodynia: Secondary | ICD-10-CM | POA: Insufficient documentation

## 2012-09-06 ENCOUNTER — Telehealth: Payer: Self-pay | Admitting: Family Medicine

## 2012-09-06 NOTE — Telephone Encounter (Signed)
[name] dropped off a shot record form to be filled out.

## 2012-09-08 NOTE — Telephone Encounter (Signed)
tdap date listed.  She'll need to call about getting the MMR and varicella titers done.

## 2012-09-09 NOTE — Telephone Encounter (Signed)
Left message on voice mail  to call back

## 2012-09-09 NOTE — Telephone Encounter (Signed)
Patient notified by telephone and form left up front for pickup.

## 2012-10-03 ENCOUNTER — Telehealth: Payer: Self-pay | Admitting: Family Medicine

## 2012-10-03 NOTE — Telephone Encounter (Signed)
Noted, thanks!

## 2012-10-03 NOTE — Telephone Encounter (Signed)
Left voicemail requesting pt to call office 

## 2012-10-03 NOTE — Telephone Encounter (Signed)
Agreed, see if you can get update from patient.  Thanks.

## 2012-10-03 NOTE — Telephone Encounter (Signed)
[facility] Triage Call Report Triage Record Num: [id number] Operator: Ether Griffins Patient Name: [name] Call Date & Time: 10/02/2012 5:00:45PM Patient Phone: PCP: Crawford Givens Patient Gender: Female PCP Fax : Patient DOB: September 28, 1965 Practice Name: [facility] Reason for Call: Caller: Shelaine/Patient; PCP: Crawford Givens Clelia Croft) [facility]); CB#: [phone]; Calling about headache,stiffness in neck and pain,numbness on left side of face and body. Onset 09/29/12. Taking Ibuprofen,Aleve,Flexeril with fair effect for pain but numbness persists. Hx of neck surgery. Has appt scheduled at office this month but sxs getting worse. Guideline: Numbness or Tingling. Disposition: Activate EMS 911. Reason for Disposition: New numbness/tingling associated with weakness or paralysis involving face,arm or leg,especially on same side of body,or loss of coordination occurring now or within last 4 hours. Advised to hang up and call 911. Protocol(s) Used: Numbness or Tingling Recommended Outcome per Protocol: Activate EMS 911 Reason for Outcome: New numbness/tingling associated with weakness or paralysis (unable to move) involving face, arm or leg, especially on same side of body, or loss of coordination (purposeful action) occurring now or within last 4 hours. Care Advice: ~ 10/02/2012 5:14:08PM Page 1 of 1 [id number]

## 2012-10-03 NOTE — Telephone Encounter (Signed)
Pt didn't go to hospital, she took a flexeril and went to sleep. Pt said when she woke up she felt better so that's why she didn't go to hospital. Pt scheduled a f/u with you for Monday, I offered to schedule a f/u with another provider and pt declined she said she wants to wait to see you. I advised pt if her sxs return or worsen she needs to go to ER for eval, pt verbalized understanding.

## 2012-10-07 ENCOUNTER — Encounter: Payer: Self-pay | Admitting: Family Medicine

## 2012-10-07 ENCOUNTER — Ambulatory Visit (INDEPENDENT_AMBULATORY_CARE_PROVIDER_SITE_OTHER): Payer: 59 | Admitting: Family Medicine

## 2012-10-07 VITALS — BP 112/66 | HR 71 | Temp 98.6°F | Ht 64.5 in | Wt 219.0 lb

## 2012-10-07 DIAGNOSIS — I498 Other specified cardiac arrhythmias: Secondary | ICD-10-CM

## 2012-10-07 DIAGNOSIS — R209 Unspecified disturbances of skin sensation: Secondary | ICD-10-CM

## 2012-10-07 DIAGNOSIS — I471 Supraventricular tachycardia: Secondary | ICD-10-CM

## 2012-10-07 DIAGNOSIS — R002 Palpitations: Secondary | ICD-10-CM

## 2012-10-07 DIAGNOSIS — R202 Paresthesia of skin: Secondary | ICD-10-CM

## 2012-10-07 MED ORDER — METOPROLOL TARTRATE 25 MG PO TABS: 25.0000 mg | ORAL_TABLET | Freq: Two times a day (BID) | ORAL | Status: DC | PRN

## 2012-10-07 NOTE — Patient Instructions (Addendum)
Go to the lab on the way out.  We'll contact you with your lab report. Take flexeril for your neck and stretch your foot.   [name] will call about your referral and the CT. I need to see you feel when the CT is completed.  Take care.

## 2012-10-07 NOTE — Progress Notes (Signed)
She had some off and on back troubles over the years.  Recent sx started last week.  She woke up with L arm and leg numbness.  She has some L sided posterior neck pain and "I thought I slept wrong."  Pain with ROM of neck but she could still move her neck.  L sided numbness persisted in the interval.   No motor deficit.   Neck soreness improved and numbness gradually improved.  No R sided sx.  She took flexeril recently for the neck sx.    Still with some L arm and L leg numbness.  No weakness.    Over the last few weeks, she would wake with L sided facial numbness that would improve as the day went along, then return by the next AM.    She has L hip and L arch pain, L side of neck is still tender.    Recently with return of palpitations. No CP.  Not on meds. Needs to get set up again with cards.  Palpitations better with deep breaths.  Episodes last about 1 minute.  H/o SVT.   Meds, vitals, and allergies reviewed.   ROS: See HPI.  Otherwise, noncontributory.  GEN: nad, alert and oriented HEENT: mucous membranes moist NECK: supple w/o LA and normal ROM but L posterior neck ttp, not ttp on midline CV: rrr.  PULM: ctab, no inc wob ABD: soft, +bs EXT: no edema SKIN: no acute rash L greater troch and L plantar fascia ttp CN 2-12 wnl B, S/S/DTR wnl x4 except for dec in sens on the L side of the face

## 2012-10-08 ENCOUNTER — Other Ambulatory Visit: Payer: Self-pay | Admitting: Family Medicine

## 2012-10-08 DIAGNOSIS — R202 Paresthesia of skin: Secondary | ICD-10-CM | POA: Insufficient documentation

## 2012-10-08 LAB — BASIC METABOLIC PANEL
Calcium: 8.8 mg/dL (ref 8.4–10.5)
Chloride: 107 mEq/L (ref 96–112)
Creatinine, Ser: 0.8 mg/dL (ref 0.4–1.2)

## 2012-10-08 LAB — CBC WITH DIFFERENTIAL/PLATELET
Basophils Absolute: 0.1 10*3/uL (ref 0.0–0.1)
Eosinophils Absolute: 0.1 10*3/uL (ref 0.0–0.7)
Hemoglobin: 13.3 g/dL (ref 12.0–15.0)
Lymphocytes Relative: 37.9 % (ref 12.0–46.0)
MCHC: 33 g/dL (ref 30.0–36.0)
Neutro Abs: 5.2 10*3/uL (ref 1.4–7.7)
RDW: 15.1 % — ABNORMAL HIGH (ref 11.5–14.6)

## 2012-10-08 NOTE — Assessment & Plan Note (Signed)
Persistent L sided facial numbness, V1-V3, with resolving L arm and leg numbness.  L troch and plantar pain likely from compensation of gait in meantime.  Not acute, sx are ongoing for >1week.  Check CT head and basic labs today.  Unclear source.  I can't explain one lesion that would cause the fluctuating sx of numbness in this distribution.  We may need to check echo and carotids, but I would start with CT head.  I doubt CVA would have caused this, esp with fluctuating facial numbness.  Neck pain appears to be MSK in origin.  >25 min spent with face to face with patient, >50% counseling and/or coordinating care

## 2012-10-08 NOTE — Assessment & Plan Note (Signed)
Refer back to cards.  See notes on labs.  Use BB prn.

## 2012-10-09 NOTE — Addendum Note (Signed)
Addended by: Joaquim Nam on: 10/09/2012 07:41 AM   Modules accepted: Orders

## 2012-10-12 ENCOUNTER — Encounter (HOSPITAL_COMMUNITY): Payer: Self-pay

## 2012-10-12 ENCOUNTER — Inpatient Hospital Stay (HOSPITAL_COMMUNITY)
Admission: AD | Admit: 2012-10-12 | Discharge: 2012-10-12 | Disposition: A | Discharge status: Home / Self Care | Payer: 59 | Source: Ambulatory Visit | Attending: Obstetrics & Gynecology | Admitting: Obstetrics & Gynecology

## 2012-10-12 ENCOUNTER — Inpatient Hospital Stay (HOSPITAL_COMMUNITY): Payer: 59

## 2012-10-12 DIAGNOSIS — N83209 Unspecified ovarian cyst, unspecified side: Secondary | ICD-10-CM | POA: Insufficient documentation

## 2012-10-12 DIAGNOSIS — N938 Other specified abnormal uterine and vaginal bleeding: Secondary | ICD-10-CM | POA: Insufficient documentation

## 2012-10-12 DIAGNOSIS — D259 Leiomyoma of uterus, unspecified: Secondary | ICD-10-CM | POA: Insufficient documentation

## 2012-10-12 DIAGNOSIS — N949 Unspecified condition associated with female genital organs and menstrual cycle: Secondary | ICD-10-CM | POA: Insufficient documentation

## 2012-10-12 LAB — CBC
HCT: 35.6 % — ABNORMAL LOW (ref 36.0–46.0)
Hemoglobin: 11.9 g/dL — ABNORMAL LOW (ref 12.0–15.0)
MCH: 28.5 pg (ref 26.0–34.0)
MCHC: 33.4 g/dL (ref 30.0–36.0)
MCV: 85.4 fL (ref 78.0–100.0)

## 2012-10-12 LAB — POCT PREGNANCY, URINE: Preg Test, Ur: NEGATIVE

## 2012-10-12 LAB — COMPREHENSIVE METABOLIC PANEL
Alkaline Phosphatase: 74 U/L (ref 39–117)
BUN: 14 mg/dL (ref 6–23)
Calcium: 9.5 mg/dL (ref 8.4–10.5)
Creatinine, Ser: 0.75 mg/dL (ref 0.50–1.10)
GFR calc Af Amer: >90 mL/min (ref >90)
Glucose, Bld: 108 mg/dL — ABNORMAL HIGH (ref 70–99)
Total Protein: 6.7 g/dL (ref 6.0–8.3)

## 2012-10-12 LAB — URINALYSIS, ROUTINE W REFLEX MICROSCOPIC
Glucose, UA: NEGATIVE mg/dL
Protein, ur: NEGATIVE mg/dL

## 2012-10-12 LAB — WET PREP, GENITAL
Clue Cells Wet Prep HPF POC: NONE SEEN
Trich, Wet Prep: NONE SEEN
Yeast Wet Prep HPF POC: NONE SEEN

## 2012-10-12 LAB — URINE MICROSCOPIC-ADD ON

## 2012-10-12 LAB — GLUCOSE, CAPILLARY: Glucose-Capillary: 107 mg/dL — ABNORMAL HIGH (ref 70–99)

## 2012-10-12 MED ORDER — PROMETHAZINE HCL 25 MG PO TABS
25.0000 mg | ORAL_TABLET | Freq: Once | ORAL | Status: AC
  Administered 2012-10-12: 25 mg via ORAL
  Filled 2012-10-12 (×2): qty 1

## 2012-10-12 MED ORDER — KETOROLAC TROMETHAMINE 60 MG/2ML IM SOLN
60.0000 mg | Freq: Once | INTRAMUSCULAR | Status: AC
  Administered 2012-10-12: 60 mg via INTRAMUSCULAR
  Filled 2012-10-12 (×2): qty 2

## 2012-10-12 MED ORDER — PROMETHAZINE HCL 25 MG PO TABS: 25.0000 mg | ORAL_TABLET | Freq: Four times a day (QID) | ORAL | Status: DC | PRN

## 2012-10-12 MED ORDER — IBUPROFEN 600 MG PO TABS: 600.0000 mg | ORAL_TABLET | Freq: Four times a day (QID) | ORAL | Status: DC | PRN

## 2012-10-12 NOTE — MAU Note (Signed)
Pt states no menses since 08/01/2012, then last week (10/01/2012) began bleeding intermittently. Saturday began bleeding heavily. Has been changing pad q2 hours. Has seen quarter sized blood clots. Came in via EMS.

## 2012-10-12 NOTE — MAU Provider Note (Signed)
History     CSN: [id number]  Arrival date and time: 10/12/12 0756   First Provider Initiated Contact with Patient 10/12/12 0827      Chief Complaint  Patient presents with  . Vaginal Bleeding   HPI  [name] is a 47 y.o.non-pregnant female who presents via EMS with complaints weakness, vaginal bleeding and HA. She was driving home from work and when she got home she went to get out of her car and felt weak and felt like she was going to pass out. The patient never lost consciousness, never passed out. She called 911 immediately. The patient is accompanied by her husband who states that the patient has not had anything to eat since last night at 1130 because she felt Nauseous.  [facility], however has not been to his office since 2012. She is currently on her menstrual cycle; has been bleeding since last Tuesday. She has had irregular cycles and irregular bleeding since May with a history of fibroids, and is currently experiencing pain in her right lower abdomen. The HA started this morning around the time she felt dizzy.   OB History   Grav Para Term Preterm Abortions TAB SAB Ect Mult Living   6 4 3 1 2 2    4       Past Medical History  Diagnosis Date  . Other symptoms involving digestive system(787.99)   . Headache(784.0)   . Other specified cardiac dysrhythmias(427.89)   . Abdominal  pain, other specified site   . Other constipation   . Anxiety state, unspecified     Past Surgical History  Procedure Laterality Date  . Vaginal delivery  90, 97, 99, 03    x 4   . Hernia repair    . Tubal ligation    . Cervical disc surgery      Ruptured disc repair / and approach c5/6, c6/7  . Cardiac catheterization  12/09/03    Normal    Family History  Problem Relation Age of Onset  . Deep vein thrombosis Mother     crebral blood clot   . Heart failure Father   . Depression Maternal Uncle   . Diabetes Maternal Grandmother   . Stroke  Maternal Grandmother   . Cancer Maternal Grandfather     Prostate   . Hypertension Neg Hx     History  Substance Use Topics  . Smoking status: Never Smoker   . Smokeless tobacco: Never Used  . Alcohol Use: No    Allergies:  Allergies  Allergen Reactions  . Sulfonamide Derivatives Rash    Prescriptions prior to admission  Medication Sig Dispense Refill  . cyclobenzaprine (FLEXERIL) 10 MG tablet Take 10 mg by mouth 3 (three) times daily as needed for muscle spasms.      Marland Kitchen ibuprofen (ADVIL,MOTRIN) 200 MG tablet Take 400-600 mg by mouth every 6 (six) hours as needed. For pain/headache      . metoprolol tartrate (LOPRESSOR) 25 MG tablet Take 1 tablet (25 mg total) by mouth 2 (two) times daily as needed.  60 tablet  1  . multivitamin (THERAGRAN) per tablet Take 1 tablet by mouth daily.         Results for orders placed during the hospital encounter of 10/12/12 (from the past 24 hour(s))  CBC     Status: Abnormal   Collection Time    10/12/12  8:48 AM      Result Value Range  WBC 8.8  4.0 - 10.5 K/uL   RBC 4.17  3.87 - 5.11 MIL/uL   Hemoglobin 11.9 (*) 12.0 - 15.0 g/dL   HCT 30.8 (*) 65.7 - 84.6 %   MCV 85.4  78.0 - 100.0 fL   MCH 28.5  26.0 - 34.0 pg   MCHC 33.4  30.0 - 36.0 g/dL   RDW 96.2  95.2 - 84.1 %   Platelets 331  150 - 400 K/uL  COMPREHENSIVE METABOLIC PANEL     Status: Abnormal   Collection Time    10/12/12  8:48 AM      Result Value Range   Sodium 137  135 - 145 mEq/L   Potassium 4.1  3.5 - 5.1 mEq/L   Chloride 104  96 - 112 mEq/L   CO2 24  19 - 32 mEq/L   Glucose, Bld 108 (*) 70 - 99 mg/dL   BUN 14  6 - 23 mg/dL   Creatinine, Ser 3.24  0.50 - 1.10 mg/dL   Calcium 9.5  8.4 - 40.1 mg/dL   Total Protein 6.7  6.0 - 8.3 g/dL   Albumin 3.5  3.5 - 5.2 g/dL   AST 14  0 - 37 U/L   ALT 13  0 - 35 U/L   Alkaline Phosphatase 74  39 - 117 U/L   Total Bilirubin 0.3  0.3 - 1.2 mg/dL   GFR calc non Af Amer >90  >90 mL/min   GFR calc Af Amer >90  >90 mL/min   URINALYSIS, ROUTINE W REFLEX MICROSCOPIC     Status: Abnormal   Collection Time    10/12/12  9:05 AM      Result Value Range   Color, Urine YELLOW  YELLOW   APPearance HAZY (*) CLEAR   Specific Gravity, Urine 1.015  1.005 - 1.030   pH 6.0  5.0 - 8.0   Glucose, UA NEGATIVE  NEGATIVE mg/dL   Hgb urine dipstick LARGE (*) NEGATIVE   Bilirubin Urine NEGATIVE  NEGATIVE   Ketones, ur 15 (*) NEGATIVE mg/dL   Protein, ur NEGATIVE  NEGATIVE mg/dL   Urobilinogen, UA 0.2  0.0 - 1.0 mg/dL   Nitrite NEGATIVE  NEGATIVE   Leukocytes, UA SMALL (*) NEGATIVE  URINE MICROSCOPIC-ADD ON     Status: Abnormal   Collection Time    10/12/12  9:05 AM      Result Value Range   Squamous Epithelial / LPF FEW (*) RARE   WBC, UA 7-10  <3 WBC/hpf   RBC / HPF 21-50  <3 RBC/hpf   Bacteria, UA FEW (*) RARE  GLUCOSE, CAPILLARY     Status: Abnormal   Collection Time    10/12/12  9:29 AM      Result Value Range   Glucose-Capillary 107 (*) 70 - 99 mg/dL   Comment 1 Notify RN    WET PREP, GENITAL     Status: Abnormal   Collection Time    10/12/12  9:45 AM      Result Value Range   Yeast Wet Prep HPF POC NONE SEEN  NONE SEEN   Trich, Wet Prep NONE SEEN  NONE SEEN   Clue Cells Wet Prep HPF POC NONE SEEN  NONE SEEN   WBC, Wet Prep HPF POC FEW (*) NONE SEEN  POCT PREGNANCY, URINE     Status: None   Collection Time    10/12/12 10:12 AM      Result Value Range   Preg Test, Ur  NEGATIVE  NEGATIVE   US Transvaginal Non-ob  10/12/2012   *RADIOLOGY REPORT*  Clinical Data: pelvic pain  TRANSABDOMINAL AND TRANSVAGINAL ULTRASOUND OF PELVIS  Technique:  Both transabdominal and transvaginal ultrasound examinations of the pelvis were performed.  Transabdominal technique was performed for global imaging of the pelvis including uterus, ovaries, adnexal regions, and pelvic cul-de-sac.  It was necessary to proceed with endovaginal exam following the transabdominal exam to visualize the endometrium and ovaries.  Comparison:   09/09/2010 CT scan  Findings: Uterus:  Normal in size and appearance  Endometrium: Appears normal, and measures 10 mm in thickness  Right ovary: Normal appearance with 2 cm simple cyst  Left ovary: Normal appearance/no adnexal mass  Other Findings:  There is a 9 x 6 x 8 mm oval echogenic lesion within the myometrium of the right uterine fundus  IMPRESSION: No acute findings.  2 cm simple cyst right ovary incidentally noted.  1 cm oval circumscribed echogenic mass myometrium right fundus, of nonspecific origin.  A small fibroid with some calcium deposition could have this appearance.   Original Report Authenticated By: Esperanza Heir, M.D.   US Pelvis Complete  10/12/2012   *RADIOLOGY REPORT*  Clinical Data: pelvic pain  TRANSABDOMINAL AND TRANSVAGINAL ULTRASOUND OF PELVIS  Technique:  Both transabdominal and transvaginal ultrasound examinations of the pelvis were performed.  Transabdominal technique was performed for global imaging of the pelvis including uterus, ovaries, adnexal regions, and pelvic cul-de-sac.  It was necessary to proceed with endovaginal exam following the transabdominal exam to visualize the endometrium and ovaries.  Comparison:  09/09/2010 CT scan  Findings: Uterus:  Normal in size and appearance  Endometrium: Appears normal, and measures 10 mm in thickness  Right ovary: Normal appearance with 2 cm simple cyst  Left ovary: Normal appearance/no adnexal mass  Other Findings:  There is a 9 x 6 x 8 mm oval echogenic lesion within the myometrium of the right uterine fundus  IMPRESSION: No acute findings.  2 cm simple cyst right ovary incidentally noted.  1 cm oval circumscribed echogenic mass myometrium right fundus, of nonspecific origin.  A small fibroid with some calcium deposition could have this appearance.   Original Report Authenticated By: Esperanza Heir, M.D.     Review of Systems  Constitutional: Positive for chills and malaise/fatigue. Negative for fever.  Eyes: Positive for  blurred vision.  Cardiovascular: Negative for chest pain.  Gastrointestinal: Positive for nausea, abdominal pain and constipation. Negative for vomiting and diarrhea.       Right lower abdominal pain   Genitourinary: Negative for dysuria, urgency and frequency.       No vaginal discharge. Vaginal bleeding; changes her pad every 2-3 hours No dysuria.   Neurological: Positive for dizziness, weakness and headaches.  Psychiatric/Behavioral: The patient is nervous/anxious.    Physical Exam   Blood pressure 112/65, pulse 86, temperature 97.4 F (36.3 C), temperature source Oral, resp. rate 16, height 5' 4.5" (1.638 m), weight 99.338 kg (219 lb), last menstrual period 10/01/2012, SpO2 100.00%.  Physical Exam  Constitutional: She is oriented to person, place, and time. She appears well-developed and well-nourished. No distress.  HENT:  Head: Normocephalic.  Neck: Neck supple.  Cardiovascular: Normal rate, regular rhythm and normal heart sounds.   Respiratory: Effort normal.  GI: Soft. She exhibits no distension and no mass. There is tenderness. There is no rebound and no guarding.  Right lower/mid quadrant abdominal pain   Genitourinary:  Speculum exam: Vagina - Small amount of blood  in vaginal canal, mild odor Cervix - small amount of bleeding from os Bimanual exam: Cervix closed Uterus non tender, normal size Bilateral adnexal tenderness, no masses bilaterally GC/Chlam, wet prep done Chaperone present for exam.   Neurological: She is alert and oriented to person, place, and time.  Skin: Skin is warm. She is not diaphoretic.    MAU Course  Procedures None   MDM CBG UA UPT Phenergan Toradol 60 mg IM  Orthostatic blood pressures Wet prep GC/Chlamydia- pending  [place]; pelvic US complete    Consulted with Dr. Lovett Sox regarding plan of care; ok to follow up with [facility]  Assessment and Plan  A: Uterine Fibroid  Ovarian cysts Irregular vaginal bleeding     P: Discharge home RX: ibuprofen 600 mg PO Q6 hours as needed for pain (#30) no rf        Phenergan 25 mg PO Q6 hours as needed for pain (#20) no rf  Follow up with [facility] Return to MAU if symptoms worsen Bleeding precautions discussed   Areesha Dehaven IRENE FNP-C 10/12/2012, 3:08 PM

## 2012-10-12 NOTE — MAU Provider Note (Signed)
Attestation of Attending Supervision of Advanced Practitioner (PA/CNM/NP): Evaluation and management procedures were performed by the Advanced Practitioner under my supervision and collaboration.  I have reviewed the Advanced Practitioner's note and chart, and I agree with the management and plan.  Raghav Verrilli, MD, FACOG Attending Obstetrician & Gynecologist Faculty Practice, [facility]  

## 2012-10-13 LAB — URINE CULTURE: Colony Count: 75000

## 2012-10-17 ENCOUNTER — Ambulatory Visit: Payer: 59 | Admitting: Cardiology

## 2012-10-17 ENCOUNTER — Inpatient Hospital Stay: Admission: RE | Admit: 2012-10-17 | Payer: 59 | Source: Ambulatory Visit

## 2012-11-11 ENCOUNTER — Telehealth: Payer: Self-pay | Admitting: Radiology

## 2012-11-11 NOTE — Telephone Encounter (Signed)
Opened in error

## 2012-11-12 ENCOUNTER — Telehealth: Payer: Self-pay | Admitting: Radiology

## 2012-11-12 NOTE — Telephone Encounter (Signed)
LMOM for patient to call me, needed to cancel her lab appt. Per Dr Para March. No labs needed at this time. Never had a return phone call. Also, her cell and work number have been deleted from her chart since yesterday, she is a Producer, television/film/video. Call her home phone today, rang several times then disconnected

## 2012-11-15 ENCOUNTER — Other Ambulatory Visit: Payer: 59

## 2012-11-20 ENCOUNTER — Encounter: Payer: 59 | Admitting: Family Medicine

## 2012-11-20 DIAGNOSIS — Z0289 Encounter for other administrative examinations: Secondary | ICD-10-CM

## 2012-11-28 ENCOUNTER — Encounter: Payer: Self-pay | Admitting: Family Medicine

## 2012-11-28 ENCOUNTER — Ambulatory Visit (INDEPENDENT_AMBULATORY_CARE_PROVIDER_SITE_OTHER): Payer: 59 | Admitting: Family Medicine

## 2012-11-28 VITALS — BP 108/70 | HR 73 | Temp 98.3°F | Wt 216.0 lb

## 2012-11-28 DIAGNOSIS — N926 Irregular menstruation, unspecified: Secondary | ICD-10-CM

## 2012-11-28 DIAGNOSIS — R202 Paresthesia of skin: Secondary | ICD-10-CM

## 2012-11-28 DIAGNOSIS — I498 Other specified cardiac arrhythmias: Secondary | ICD-10-CM

## 2012-11-28 DIAGNOSIS — R739 Hyperglycemia, unspecified: Secondary | ICD-10-CM

## 2012-11-28 DIAGNOSIS — R7309 Other abnormal glucose: Secondary | ICD-10-CM

## 2012-11-28 DIAGNOSIS — M545 Low back pain, unspecified: Secondary | ICD-10-CM

## 2012-11-28 DIAGNOSIS — M549 Dorsalgia, unspecified: Secondary | ICD-10-CM

## 2012-11-28 DIAGNOSIS — N939 Abnormal uterine and vaginal bleeding, unspecified: Secondary | ICD-10-CM

## 2012-11-28 DIAGNOSIS — R209 Unspecified disturbances of skin sensation: Secondary | ICD-10-CM

## 2012-11-28 DIAGNOSIS — N898 Other specified noninflammatory disorders of vagina: Secondary | ICD-10-CM

## 2012-11-28 LAB — CBC WITH DIFFERENTIAL/PLATELET
Basophils Relative: 0.3 % (ref 0.0–3.0)
Eosinophils Relative: 0.7 % (ref 0.0–5.0)
Hemoglobin: 13.1 g/dL (ref 12.0–15.0)
Lymphocytes Relative: 49.5 % — ABNORMAL HIGH (ref 12.0–46.0)
MCHC: 33.1 g/dL (ref 30.0–36.0)
Monocytes Relative: 6.2 % (ref 3.0–12.0)
Neutro Abs: 3.4 10*3/uL (ref 1.4–7.7)
RBC: 4.46 Mil/uL (ref 3.87–5.11)

## 2012-11-28 LAB — POCT URINALYSIS DIPSTICK
Bilirubin, UA: NEGATIVE
Ketones, UA: NEGATIVE
Leukocytes, UA: NEGATIVE

## 2012-11-28 NOTE — Assessment & Plan Note (Signed)
See notes on labs. 

## 2012-11-28 NOTE — Assessment & Plan Note (Signed)
Likely muscle strain.

## 2012-11-28 NOTE — Assessment & Plan Note (Signed)
She cancelled cards f/u.

## 2012-11-28 NOTE — Assessment & Plan Note (Signed)
Advised pt to follow medical advice.  She needs eval, could have potentially sig pathology, no clear dx yet.  She declined CT but agreed to neuro referral.

## 2012-11-28 NOTE — Patient Instructions (Signed)
[name] will call about your referral. Go to the lab on the way out.  We'll contact you with your lab report. Hold off on the MMR until you have seen neurology.  Keep stretching your lower back.  Take care.

## 2012-11-28 NOTE — Progress Notes (Signed)
R lower back pain. Dec in appetite.  Irregular menses now, about every 2 months (over the last ~6 months), heavier when present.  Some frequent urination. Fatigued, not acute.  More gassy recently.    Felt presycnopal prev, MAU notes reviewed.  No bleeding now.  No more episodes of presyncope since then.  No syncope.    See prev note about paresthesias.  Numbness in L face and L leg continue.  Prev cancelled CT on her own, ie noncomplaint.  She reportedly talked to a chiropractor about this.  Was told it was "all in my spine."  I explained that I didn't think a spinal lesion would cause V5 changes on the face.    She cancelled prev cards referral.   Meds, vitals, and allergies reviewed.   ROS: See HPI.  Otherwise, noncontributory.  Nad, obese ncat Still with change in sensation on L lateral shin and L side of face rrr ctab abd soft, not ttp Back w/o midline pain but ttp in R lower back, pain with forward flexion.

## 2012-12-05 ENCOUNTER — Ambulatory Visit (INDEPENDENT_AMBULATORY_CARE_PROVIDER_SITE_OTHER): Payer: 59 | Admitting: Neurology

## 2012-12-05 ENCOUNTER — Encounter: Payer: Self-pay | Admitting: Neurology

## 2012-12-05 VITALS — BP 110/71 | HR 88 | Ht 64.25 in | Wt 218.0 lb

## 2012-12-05 DIAGNOSIS — R202 Paresthesia of skin: Secondary | ICD-10-CM | POA: Insufficient documentation

## 2012-12-05 DIAGNOSIS — R209 Unspecified disturbances of skin sensation: Secondary | ICD-10-CM

## 2012-12-05 NOTE — Progress Notes (Signed)
[facility]  PATIENT: [name] DOB: April 20, 1965  HISTORICAL: Attie is a 47 years old right-handed Caucasian female, she is referred by her primary care physician Dr. 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 She has no bowel and bladder incontinence,   She also complains of frequent, almost daily left frontal headaches, pressure, shooting pain, she is taking daily ibuprofen 3 times a day for one year because of her headaches    REVIEW OF SYSTEMS: Full 14 system review of systems performed and notable only for weight loss, fatigue, blurry vision, easy bruising, feeling cold, achy muscles, allergy,  ALLERGIES: Allergies  Allergen Reactions  . Sulfonamide Derivatives Rash    HOME MEDICATIONS: Outpatient Prescriptions Prior to Visit  Medication Sig Dispense Refill  . cyclobenzaprine (FLEXERIL) 10 MG tablet Take 10 mg by mouth 3 (three) times daily as needed for muscle spasms.      Marland Kitchen ibuprofen (ADVIL,MOTRIN) 600 MG tablet Take 1 tablet (600 mg total) by mouth every 6 (six) hours as needed for pain.  30 tablet  0  . multivitamin (THERAGRAN) per tablet Take 1 tablet by mouth daily.       . Omega-3 Fatty Acids (FISH OIL PO) Take 1 tablet by mouth daily.      . promethazine (PHENERGAN) 25 MG tablet Take 1 tablet (25 mg total) by mouth every 6 (six) hours as  needed for nausea.  20 tablet  0    PAST MEDICAL HISTORY: Past Medical History  Diagnosis Date  . Other symptoms involving digestive system(787.99)   . Headache(784.0)   . Other specified cardiac dysrhythmias(427.89)   . Abdominal  pain, other specified site   . Other constipation   . Anxiety state, unspecified     PAST SURGICAL HISTORY: Past Surgical History  Procedure Laterality Date  . Vaginal delivery  90, 97, 99, 03  . Hernia repair    . Tubal ligation    . Cervical disc surgery      Ruptured disc repair / and approach c5/6, c6/7  . Cardiac catheterization  12/09/03    FAMILY HISTORY: Family History  Problem Relation Age of Onset  . Deep vein thrombosis Mother     crebral blood clot   . Heart failure Father   . Depression Maternal Uncle   . Diabetes Maternal Grandmother   . Stroke Maternal Grandmother   . Cancer Maternal Grandfather     Prostate   . Hypertension Neg Hx     SOCIAL HISTORY:  History   Social History  . Marital Status: Married    Spouse Name: Jeri Modena    Number of Children: 4  . Years of Education: 14   Occupational History  . Sleep Lab Tech      [employer]   .  [place]   Social History Main Topics  . Smoking status: Never Smoker   . Smokeless  tobacco: Never Used  . Alcohol Use: No  . Drug Use: No  . Sexual Activity: Yes   Other Topics Concern  . Not on file   Social History Narrative   Patient lives with her husband Jeri Modena ) and her children.   Patient has four children.   Patient works full-time.   Patient has a college education.   Patient is right-handed.   Patient drinks caffeine- once or twice a week.    PHYSICAL EXAM   Filed Vitals:   12/05/12 1047  BP: 110/71  Pulse: 88  Height: 5' 4.25" (1.632 m)  Weight: 218 lb (98.884 kg)    Body mass index is 37.13 kg/(m^2).   Generalized: In no acute distress  Neck: Supple, no carotid bruits   Cardiac: Regular rate rhythm  Pulmonary: Clear to  auscultation bilaterally  Musculoskeletal: No deformity  Neurological examination  Mentation: Alert oriented to time, place, history taking, and causual conversation  Cranial nerve II-XII: Pupils were equal round reactive to light extraocular movements were full, visual field were full on confrontational test. facial sensation and strength were normal. hearing was intact to finger rubbing bilaterally. Uvula tongue midline.  head turning and shoulder shrug and were normal and symmetric.Tongue protrusion into cheek strength was normal.  Motor: normal tone, bulk and strength.  Sensory: Intact to fine touch, pinprick, preserved vibratory sensation, and proprioception at toes.  Coordination: Normal finger to nose, heel-to-shin bilaterally there was no truncal ataxia  Gait: Rising up from seated position without assistance, normal stance, without trunk ataxia, moderate stride, good arm swing, smooth turning, able to perform tiptoe, and heel walking without difficulty.   Romberg signs: Negative  Deep tendon reflexes: Brachioradialis 2/2, biceps 2/2, triceps 2/2, patellar 2/2, Achilles 2/2, plantar responses were flexor bilaterally.   DIAGNOSTIC DATA (LABS, IMAGING, TESTING) - I reviewed patient records, labs, notes, testing and imaging myself where available.  Lab Results  Component Value Date   WBC 7.9 11/28/2012   HGB 13.1 11/28/2012   HCT 39.7 11/28/2012   MCV 89.0 11/28/2012   PLT 352.0 11/28/2012      Component Value Date/Time   NA 137 10/12/2012 0848   K 4.1 10/12/2012 0848   CL 104 10/12/2012 0848   CO2 24 10/12/2012 0848   GLUCOSE 99 11/28/2012 1213   BUN 14 10/12/2012 0848   CREATININE 0.75 10/12/2012 0848   CALCIUM 9.5 10/12/2012 0848   PROT 6.7 10/12/2012 0848   ALBUMIN 3.5 10/12/2012 0848   AST 14 10/12/2012 0848   ALT 13 10/12/2012 0848   ALKPHOS 74 10/12/2012 0848   BILITOT 0.3 10/12/2012 0848   GFRNONAA >90 10/12/2012 0848   GFRAA >90 10/12/2012 0848   Lab Results    Component Value Date   CHOL 184 01/26/2011   HDL 43.50 01/26/2011   LDLCALC 124* 01/26/2011   LDLDIRECT 119.3 09/24/2007   TRIG 82.0 01/26/2011   CHOLHDL 4 01/26/2011   Lab Results  Component Value Date   HGBA1C 5.7 11/28/2012    Lab Results  Component Value Date   TSH 1.33 10/08/2012     ASSESSMENT AND PLAN   47 years old right-handed [place] American female, with constellation of complaints, including frequent left frontal headaches, intermittent left arm and leg weakness, shoulder pain, desires further evaluation.  1, MRI of brain, to rule out right hemispheric pathology 2, I have suggested her physical therapy, moderate exercise 3, tapering off daily ibuprofen use      Levert Feinstein, M.D. Ph.D.  [facility] [address], [address] [place], [place] [address] [phone] 

## 2013-02-18 ENCOUNTER — Ambulatory Visit: Payer: 59 | Admitting: Neurology

## 2013-05-15 IMAGING — CR DG CHEST 2V
2 series · 2 of 2 positions shown · non-contrast
Comparison: 04/11/2009

CLINICAL DATA: Weakness.  Headache.  Shortness of breath.

CHEST - 2 VIEW

[w chest pa]
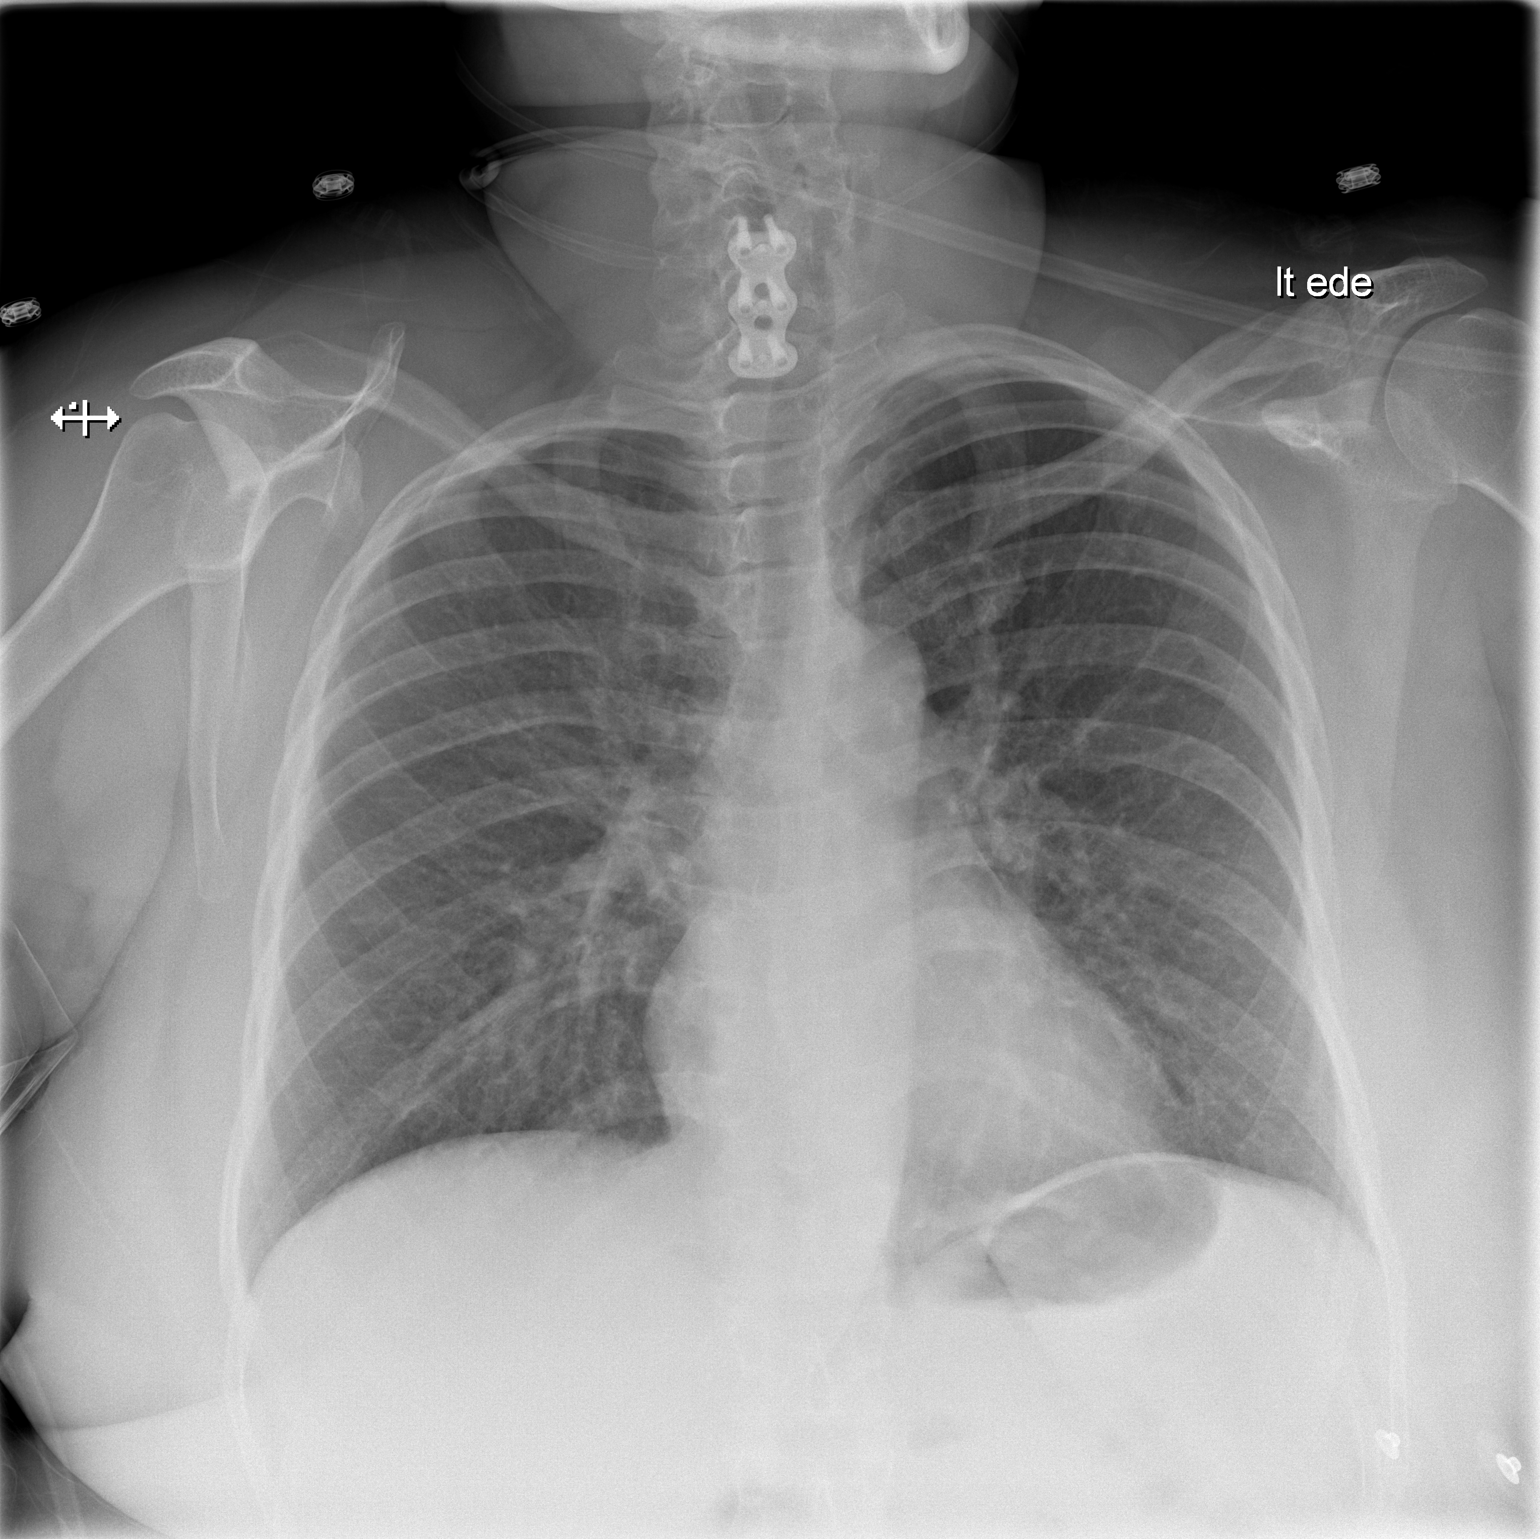

[w chest lat]
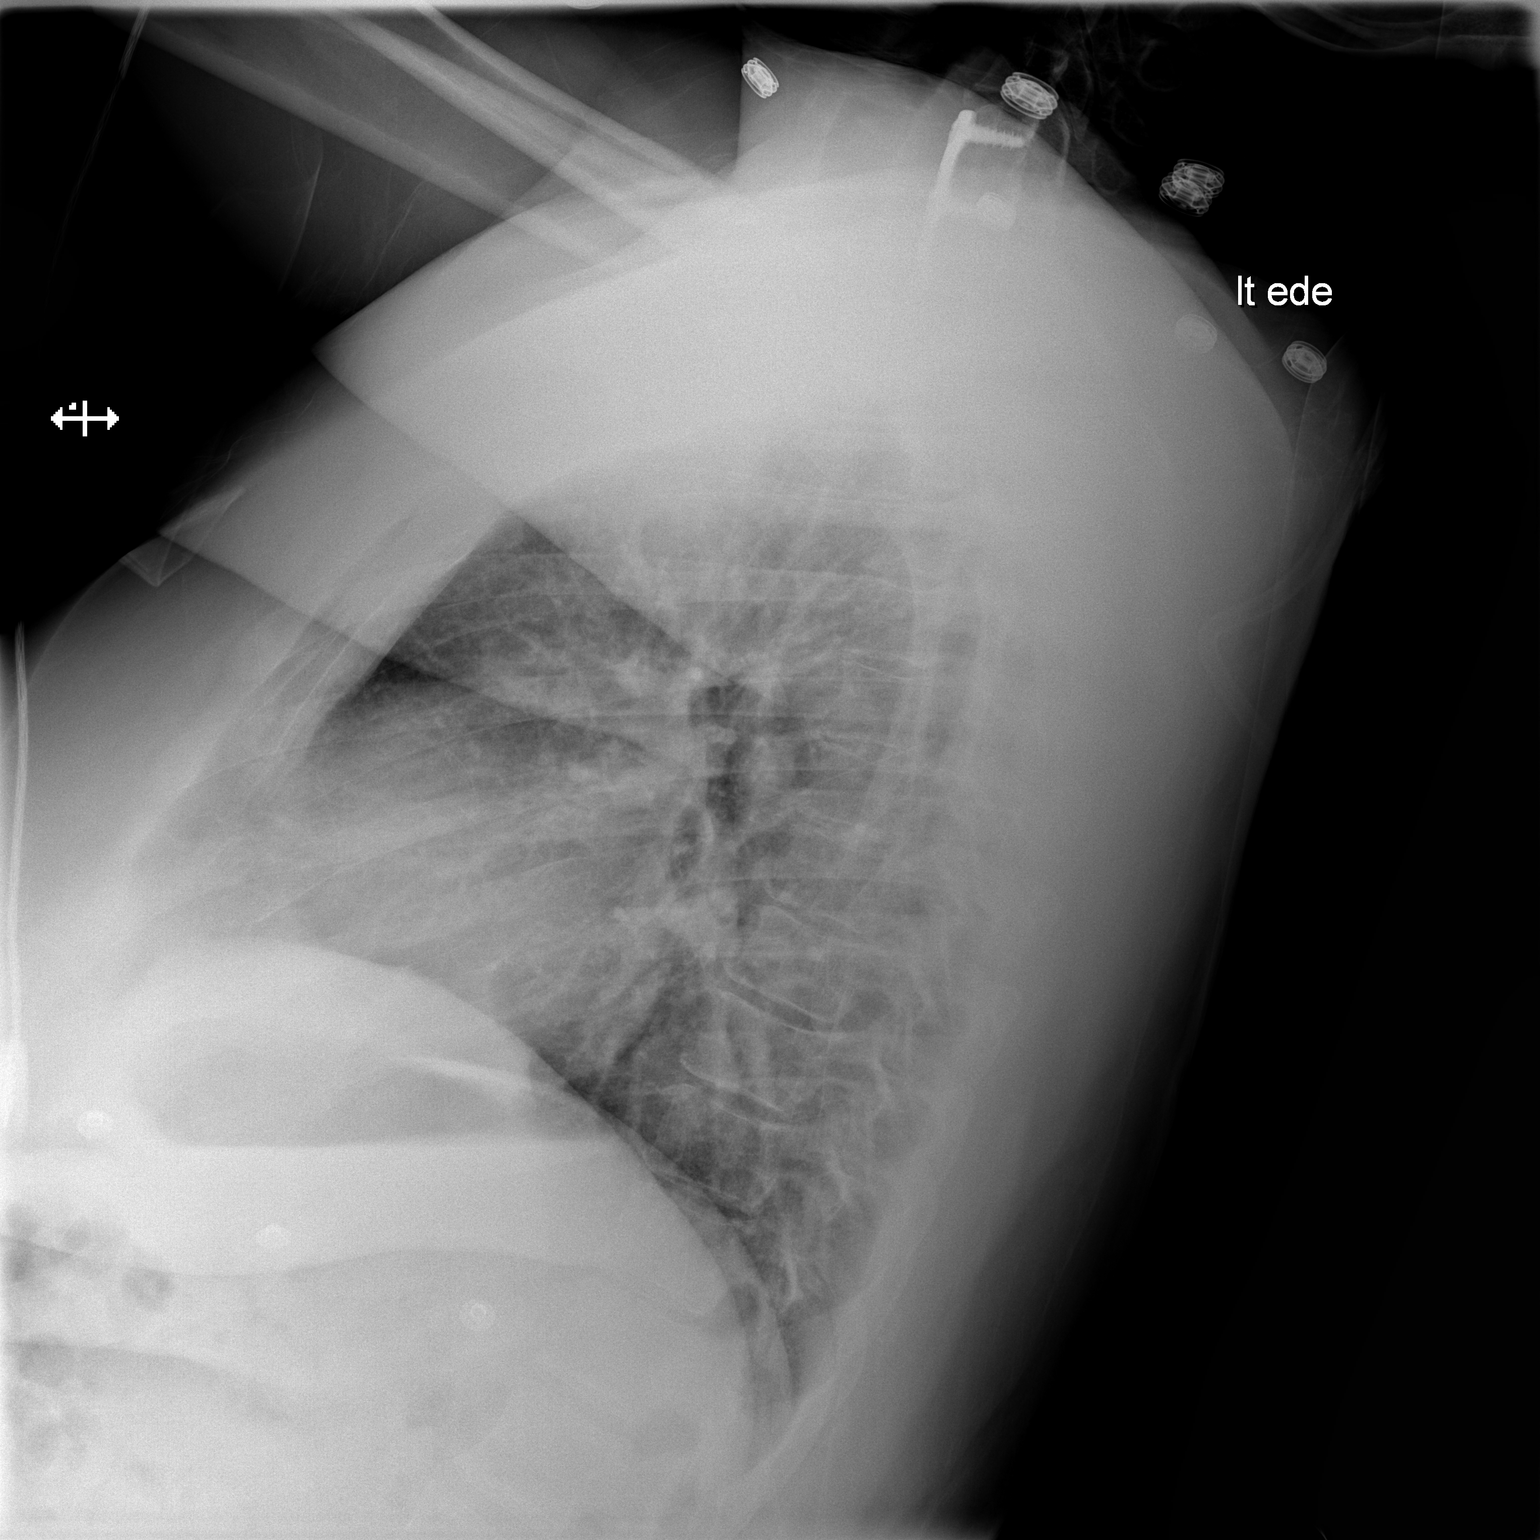

[2 of 2 positions shown; findings below may reference images not displayed]

FINDINGS: Lateral view degraded by patient arm position.

Lower cervical spine fixation. Midline trachea.  Patient minimally
rotated left. Normal heart size and mediastinal contours. No
pleural effusion or pneumothorax.  Clear lungs.
IMPRESSION: No acute cardiopulmonary disease.

## 2013-06-27 ENCOUNTER — Ambulatory Visit (INDEPENDENT_AMBULATORY_CARE_PROVIDER_SITE_OTHER): Payer: 59 | Admitting: Family Medicine

## 2013-06-27 ENCOUNTER — Encounter: Payer: Self-pay | Admitting: Family Medicine

## 2013-06-27 VITALS — BP 110/74 | HR 94 | Temp 98.7°F | Wt 214.2 lb

## 2013-06-27 DIAGNOSIS — R319 Hematuria, unspecified: Secondary | ICD-10-CM

## 2013-06-27 DIAGNOSIS — R350 Frequency of micturition: Secondary | ICD-10-CM

## 2013-06-27 DIAGNOSIS — R209 Unspecified disturbances of skin sensation: Secondary | ICD-10-CM

## 2013-06-27 DIAGNOSIS — R202 Paresthesia of skin: Secondary | ICD-10-CM

## 2013-06-27 LAB — CBC WITH DIFFERENTIAL/PLATELET
BASOS ABS: 0 10*3/uL (ref 0.0–0.1)
BASOS PCT: 0 % (ref 0–1)
EOS PCT: 1 % (ref 0–5)
Eosinophils Absolute: 0.1 10*3/uL (ref 0.0–0.7)
HEMATOCRIT: 37 % (ref 36.0–46.0)
Hemoglobin: 12.1 g/dL (ref 12.0–15.0)
Lymphocytes Relative: 39 % (ref 12–46)
Lymphs Abs: 2.7 10*3/uL (ref 0.7–4.0)
MCH: 28.7 pg (ref 26.0–34.0)
MCHC: 32.7 g/dL (ref 30.0–36.0)
MCV: 87.7 fL (ref 78.0–100.0)
MONO ABS: 0.3 10*3/uL (ref 0.1–1.0)
Monocytes Relative: 5 % (ref 3–12)
NEUTROS ABS: 3.8 10*3/uL (ref 1.7–7.7)
Neutrophils Relative %: 55 % (ref 43–77)
PLATELETS: 375 10*3/uL (ref 150–400)
RBC: 4.22 MIL/uL (ref 3.87–5.11)
RDW: 15.1 % (ref 11.5–15.5)
WBC: 6.9 10*3/uL (ref 4.0–10.5)

## 2013-06-27 LAB — POCT URINALYSIS DIPSTICK
Bilirubin, UA: NEGATIVE
Glucose, UA: NEGATIVE
KETONES UA: 40
LEUKOCYTES UA: NEGATIVE
Nitrite, UA: NEGATIVE
PH UA: 6
Spec Grav, UA: >=1.03
UROBILINOGEN UA: NEGATIVE

## 2013-06-27 NOTE — Progress Notes (Signed)
Pre visit review using our clinic review tool, if applicable. No additional management support is needed unless otherwise documented below in the visit note.  Urinary frequency.  Doing on for 4-5 months.  R flank pain if she doesn't urinary frequently.   Unilateral pain. No fevers, vomiting, diarrhea.  No burning with urination itself.  No vaginal discharge.    She still has L sided arm/leg/trunk numbness and pain.  Not better or worse than prev.  Ibuprofen helps for a little while, esp with her hip pain.  She is thinking about getting the MRI of her head done.  She still has the L facial numbness. I tried to explain to her again that there isn't a likely source in her back to explain the facial sx and that we still don't have a sx since she didn't have the imaging done prev. She didn't want to f/u with neuro and wanted me to order the MRI. I consented to order this.   Taking ibuprofen, 2-3 at a time, 2-3 times a day, with food.   Meds, vitals, and allergies reviewed.   ROS: See HPI.  Otherwise, noncontributory.  GEN: nad, alert and oriented HEENT: mucous membranes moist NECK: supple w/o LA CV: rrr. PULM: ctab, no inc wob ABD: soft, +bs, no cva pain EXT: no edema SKIN: no acute rash CN 2-12 wnl B, S/S/DTR wnl x4 except for altered sensation on L V1-3 and altered sensation on L arm and leg, compared to R side.

## 2013-06-27 NOTE — Patient Instructions (Signed)
[name] will call about your brain imaging.  Go to the lab on the way out.  We'll contact you with your lab report. Take care.

## 2013-06-28 ENCOUNTER — Other Ambulatory Visit: Payer: Self-pay | Admitting: Family Medicine

## 2013-06-28 DIAGNOSIS — R350 Frequency of micturition: Secondary | ICD-10-CM | POA: Insufficient documentation

## 2013-06-28 DIAGNOSIS — R319 Hematuria, unspecified: Secondary | ICD-10-CM

## 2013-06-28 LAB — BASIC METABOLIC PANEL
BUN: 13 mg/dL (ref 6–23)
CO2: 22 mEq/L (ref 19–32)
CREATININE: 0.78 mg/dL (ref 0.50–1.10)
Calcium: 8.9 mg/dL (ref 8.4–10.5)
Chloride: 106 mEq/L (ref 96–112)
GLUCOSE: 87 mg/dL (ref 70–99)
POTASSIUM: 3.7 meq/L (ref 3.5–5.3)
Sodium: 138 mEq/L (ref 135–145)

## 2013-06-28 NOTE — Assessment & Plan Note (Signed)
She still has L sided arm/leg/trunk numbness and pain. Not better or worse than prev. Ibuprofen helps for a little while, esp with her hip pain. She still has the L facial numbness. I tried to explain to her again that there isn't a likely source in her back to explain the facial sx and that we still don't have a sx since she didn't have the imaging done prev. She didn't want to f/u with neuro and wanted me to order the MRI. I consented to order this.  MRI pending.  >25 minutes spent in face to face time with patient, >50% spent in counselling or coordination of care.

## 2013-06-28 NOTE — Assessment & Plan Note (Signed)
No clear source but hematuria noted on the dip.  Will check ucx.  If neg, then get repeat u/a with micro as this could be a false positive.

## 2013-06-29 LAB — URINE CULTURE: Colony Count: 40000

## 2013-07-03 ENCOUNTER — Ambulatory Visit (INDEPENDENT_AMBULATORY_CARE_PROVIDER_SITE_OTHER): Payer: 59 | Admitting: Family Medicine

## 2013-07-03 ENCOUNTER — Encounter: Payer: Self-pay | Admitting: Family Medicine

## 2013-07-03 VITALS — BP 110/70 | HR 71 | Temp 98.0°F | Wt 214.2 lb

## 2013-07-03 DIAGNOSIS — R21 Rash and other nonspecific skin eruption: Secondary | ICD-10-CM | POA: Insufficient documentation

## 2013-07-03 MED ORDER — NYSTATIN 100000 UNIT/GM EX CREA: 1.0000 "application " | TOPICAL_CREAM | Freq: Two times a day (BID) | CUTANEOUS | Status: DC

## 2013-07-03 MED ORDER — TRIAMCINOLONE ACETONIDE 0.1 % EX CREA: 1.0000 "application " | TOPICAL_CREAM | Freq: Two times a day (BID) | CUTANEOUS | Status: DC

## 2013-07-03 NOTE — Progress Notes (Signed)
Pre visit review using our clinic review tool, if applicable. No additional management support is needed unless otherwise documented below in the visit note.  Rash on L breast.  Has happened prev on either side.  Usually not painful, but can get irritated.  itches.  Triamcinolone would usually help.  No other rash.    Meds, vitals, and allergies reviewed.   ROS: See HPI.  Otherwise, noncontributory.  nad Chaperoned exam.  Superficial rash on the L breast above the nipple, similar under the R breast.

## 2013-07-03 NOTE — Assessment & Plan Note (Signed)
Appears to be fungal, use nystatin for now, then change to talc. Can use previous tx for TAC for itching prn. She agrees. Doesn't appear to be a cellulitis.

## 2013-07-03 NOTE — Patient Instructions (Signed)
This looks like a fungal infection. Use nystatin for now and then use talc once the area is resolved.  Take care.  Let me know if this doesn't get better.

## 2013-07-18 ENCOUNTER — Other Ambulatory Visit (INDEPENDENT_AMBULATORY_CARE_PROVIDER_SITE_OTHER): Payer: 59

## 2013-07-18 DIAGNOSIS — R319 Hematuria, unspecified: Secondary | ICD-10-CM

## 2013-07-18 LAB — URINALYSIS, ROUTINE W REFLEX MICROSCOPIC
Bilirubin Urine: NEGATIVE
Leukocytes, UA: NEGATIVE
Nitrite: NEGATIVE
TOTAL PROTEIN, URINE-UPE24: NEGATIVE
URINE GLUCOSE: NEGATIVE
Urobilinogen, UA: 0.2 (ref 0.0–1.0)
pH: 5.5 (ref 5.0–8.0)

## 2013-07-21 ENCOUNTER — Telehealth: Payer: Self-pay | Admitting: Family Medicine

## 2013-07-21 NOTE — Telephone Encounter (Signed)
Encounter opened in error

## 2013-07-21 NOTE — Telephone Encounter (Signed)
Patient returned your call.

## 2013-07-23 ENCOUNTER — Telehealth: Payer: Self-pay | Admitting: Family Medicine

## 2013-07-23 ENCOUNTER — Ambulatory Visit (HOSPITAL_COMMUNITY): Payer: 59

## 2013-07-23 NOTE — Telephone Encounter (Signed)
Left message on voice mail  to call back

## 2013-07-23 NOTE — Telephone Encounter (Signed)
Patient notified as instructed by telephone. Patient stated that she has already cancelled the appointment and will talk with him about it Friday.

## 2013-07-23 NOTE — Telephone Encounter (Signed)
Please make sure she knows that cancelling it is AMA.

## 2013-07-23 NOTE — Telephone Encounter (Signed)
Patient notified as instructed by telephone. Patient stated that she is going to call and cancel her MRI for tomorrow until she comes in to talk with Dr. Damita Dunnings on Friday.

## 2013-07-23 NOTE — Telephone Encounter (Signed)
No

## 2013-07-23 NOTE — Telephone Encounter (Signed)
Patient has an MRI of the head scheduled for tomorrow at 3:00.  Patient wants to know if the order can be changed to a full body MRI.  Patient said she has too much calcium and she wants an MRI to check her kidneys and bones. Please call patient back.

## 2013-07-24 ENCOUNTER — Ambulatory Visit (HOSPITAL_COMMUNITY): Admission: RE | Admit: 2013-07-24 | Payer: 59 | Source: Ambulatory Visit

## 2013-07-25 ENCOUNTER — Encounter: Payer: Self-pay | Admitting: Family Medicine

## 2013-07-25 ENCOUNTER — Other Ambulatory Visit: Payer: Self-pay | Admitting: Family Medicine

## 2013-07-25 ENCOUNTER — Ambulatory Visit (INDEPENDENT_AMBULATORY_CARE_PROVIDER_SITE_OTHER): Payer: 59 | Admitting: Family Medicine

## 2013-07-25 VITALS — BP 118/78 | HR 74 | Temp 98.7°F | Wt 216.0 lb

## 2013-07-25 DIAGNOSIS — R202 Paresthesia of skin: Secondary | ICD-10-CM

## 2013-07-25 DIAGNOSIS — T148 Other injury of unspecified body region: Secondary | ICD-10-CM

## 2013-07-25 DIAGNOSIS — W57XXXA Bitten or stung by nonvenomous insect and other nonvenomous arthropods, initial encounter: Secondary | ICD-10-CM

## 2013-07-25 DIAGNOSIS — R82998 Other abnormal findings in urine: Secondary | ICD-10-CM

## 2013-07-25 DIAGNOSIS — R8290 Unspecified abnormal findings in urine: Secondary | ICD-10-CM

## 2013-07-25 DIAGNOSIS — R209 Unspecified disturbances of skin sensation: Secondary | ICD-10-CM

## 2013-07-25 NOTE — Progress Notes (Signed)
Pre visit review using our clinic review tool, if applicable. No additional management support is needed unless otherwise documented below in the visit note.  Tick bite.  Near L axilla.  Pulled off 07/06/13.  No fevers since it was pulled off.  No bullseye rash per patient.  The tick was flat when she took it off, not engorged.    She cancelled her MRI brain. Still with L facial and arm/leg sx.  See plan. Discussed at length.   Meds, vitals, and allergies reviewed.   ROS: See HPI.  Otherwise, noncontributory.  nad Old tick site with minimal irritation but no retained FB seen under magnification.

## 2013-07-25 NOTE — Patient Instructions (Addendum)
You'll need to reschedule your MRI if you desire to follow through with that.  We can make plans to address other symptoms after that is done and I have reviewed the results.  You can try putting OTC hydrocortisone on the irritated area.  That may help.

## 2013-07-26 LAB — URINALYSIS, ROUTINE W REFLEX MICROSCOPIC
Bilirubin Urine: NEGATIVE
Glucose, UA: NEGATIVE mg/dL
HGB URINE DIPSTICK: NEGATIVE
Ketones, ur: NEGATIVE mg/dL
LEUKOCYTES UA: NEGATIVE
NITRITE: NEGATIVE
PROTEIN: NEGATIVE mg/dL
Specific Gravity, Urine: 1.022 (ref 1.005–1.030)
Urobilinogen, UA: 1 mg/dL (ref 0.0–1.0)
pH: 7 (ref 5.0–8.0)

## 2013-07-27 DIAGNOSIS — W57XXXA Bitten or stung by nonvenomous insect and other nonvenomous arthropods, initial encounter: Secondary | ICD-10-CM | POA: Insufficient documentation

## 2013-07-27 NOTE — Assessment & Plan Note (Signed)
She has been/is noncompliant.   She told me I wasn't listening to her.  I told her that was completely wrong and that the real issue here was that she didn't/doesn't follow directions.   See prev phone note.  I explained to her a full body MRI wasn't indicated.  She wanted it done to check her bones and parathyroid glands and kidneys.  Repeated that her request wasn't indicated.   I told her that I hope she doesn't have an ominous dx, but that we don't know what she has since she has not followed through with w/u.   She needs to get the MRI brain rescheduled.  This is up to her and I told her that.  Her other option is to find another MD and follow their recommendations.   I agreed to recheck her urine today, with the incidental calcium oxalate crystals prev noted. She has no h/o renal stones.   >25 minutes spent in face to face time with patient, >50% spent in counselling or coordination of care.

## 2013-07-27 NOTE — Assessment & Plan Note (Signed)
No indication for abx at this point, can use topical hydrocortisone prn for itching/irritation. D/w pt.

## 2013-07-28 LAB — URINALYSIS, MICROSCOPIC ONLY
Bacteria, UA: NONE SEEN
Casts: NONE SEEN
Crystals: NONE SEEN
Squamous Epithelial / LPF: NONE SEEN

## 2013-11-19 ENCOUNTER — Other Ambulatory Visit (HOSPITAL_COMMUNITY)
Admission: RE | Admit: 2013-11-19 | Discharge: 2013-11-19 | Disposition: A | Discharge status: Home / Self Care | Payer: 59 | Source: Ambulatory Visit | Attending: Obstetrics & Gynecology | Admitting: Obstetrics & Gynecology

## 2013-11-19 ENCOUNTER — Ambulatory Visit (INDEPENDENT_AMBULATORY_CARE_PROVIDER_SITE_OTHER): Payer: 59 | Admitting: Women's Health

## 2013-11-19 ENCOUNTER — Encounter: Payer: Self-pay | Admitting: Women's Health

## 2013-11-19 VITALS — BP 112/70 | Ht 63.75 in | Wt 217.0 lb

## 2013-11-19 DIAGNOSIS — Z01419 Encounter for gynecological examination (general) (routine) without abnormal findings: Secondary | ICD-10-CM | POA: Diagnosis present

## 2013-11-19 DIAGNOSIS — Z1151 Encounter for screening for human papillomavirus (HPV): Secondary | ICD-10-CM | POA: Insufficient documentation

## 2013-11-19 DIAGNOSIS — Z1211 Encounter for screening for malignant neoplasm of colon: Secondary | ICD-10-CM

## 2013-11-19 DIAGNOSIS — R102 Pelvic and perineal pain: Secondary | ICD-10-CM

## 2013-11-19 DIAGNOSIS — Z1212 Encounter for screening for malignant neoplasm of rectum: Secondary | ICD-10-CM

## 2013-11-19 LAB — POC HEMOCCULT BLD/STL (OFFICE/1-CARD/DIAGNOSTIC): Fecal Occult Blood, POC: NEGATIVE

## 2013-11-19 MED ORDER — NYSTATIN 100000 UNIT/GM EX CREA: 1.0000 "application " | TOPICAL_CREAM | Freq: Two times a day (BID) | CUTANEOUS | Status: DC

## 2013-11-19 NOTE — Patient Instructions (Signed)
Use triamcinolone & nystatin together on breast twice a day x 2 weeks, if not better call us, or schedule an appointment with a dermatologist  Schedule a mammogram  Schedule a colonoscopy when you turn 49

## 2013-11-19 NOTE — Progress Notes (Signed)
Patient ID: [name], female   DOB: [birth year], 48 y.o.   MRN: [id number] Subjective:   [name] is a 48 y.o. [id number] African American female here for a routine well-woman exam.  Patient's last menstrual period was 09/13/2013.  Denies hot flashes, night sweats, mood swings, vaginal dryness.    Current complaints: itchy area on Lt breast x 1 year- has tried triamcinolone for awhile, now trying nystatin- not getting any better. Had similar areas on Rt breast that resolved w/ triamcinolone. Also has area on Rt hip.  Has been really gassy lately- no change in diet. Does have some constipation- metamucil helps. Has had a constant crampy pain on Rt lower abdomen radiating around side x 1 month. Will stay x 2-3d, then resolve, and come back.   Reading her notes from June 2015 I noticed that her PCP recommended brain MRI for c/o Lt sided facial/arm/leg paresthesias- but pt wanted full body MRI and he would not do it, so she cancelled. Pt states paresthesia just occurs at night while in bed, and goes away when she changes positions, she feels it may be r/t cervical surgery she had in past by Dr. Annette Stable. Recommended she see him again if her and PCP could not come to agreement, as PCP feels strongly she needs brain MRI.    PCP: Dr. Damita Dunnings in [facility]       Does not desire labs, done already this year by PCP  Social History: Sexual: heterosexual Marital Status: married Living situation: with family Occupation: sleep study technologist Tobacco/alcohol: no tobacco or etoh Illicit drugs: no history of illicit drug use  The following portions of the patient's history were reviewed and updated as appropriate: allergies, current medications, past family history, past medical history, past social history, past surgical history and problem list.  Past Medical History Past Medical History  Diagnosis Date  . Other symptoms involving digestive system(787.99)   . Headache(784.0)   . Other specified  cardiac dysrhythmias(427.89)   . Abdominal pain, other specified site   . Other constipation   . Anxiety state, unspecified     Past Surgical History Past Surgical History  Procedure Laterality Date  . Vaginal delivery  90, 97, 99, 03    x 4   . Hernia repair    . Tubal ligation    . Cervical disc surgery      Ruptured disc repair / and approach c5/6, c6/7  . Cardiac catheterization  12/09/03    Normal    Gynecologic History [id number]  Patient's last menstrual period was 09/13/2013. Contraception: tubal ligation Last Pap: 2-3 yrs ago per pt. Results were: normal per pt Last mammogram: March 2014. Results were: normal Last TCS: never  Obstetric History OB History  Gravida Para Term Preterm AB SAB TAB Ectopic Multiple Living  6 4 3 1 2  2   4     # Outcome Date GA Lbr Len/2nd Weight Sex Delivery Anes PTL Lv  6 TAB           5 TAB           4 PRE           3 TRM           2 TRM           1 TRM               Current Medications Current Outpatient Prescriptions on File Prior to Visit  Medication Sig Dispense  Refill  . cyclobenzaprine (FLEXERIL) 10 MG tablet Take 10 mg by mouth 3 (three) times daily as needed for muscle spasms.      Marland Kitchen ibuprofen (ADVIL,MOTRIN) 600 MG tablet Take 1 tablet (600 mg total) by mouth every 6 (six) hours as needed for pain.  30 tablet  0  . multivitamin (THERAGRAN) per tablet Take 1 tablet by mouth daily.       Marland Kitchen nystatin cream (MYCOSTATIN) Apply 1 application topically 2 (two) times daily. Use until the rash is resolved and then for 3 more days.  30 g  0  . Omega-3 Fatty Acids (FISH OIL PO) Take 1 tablet by mouth daily.      Marland Kitchen triamcinolone cream (KENALOG) 0.1 % Apply 1 application topically 2 (two) times daily. For itching       No current facility-administered medications on file prior to visit.    Review of Systems Patient denies any headaches, blurred vision, shortness of breath, chest pain, abdominal pain, problems with bowel movements,  urination, or intercourse.  Objective:  BP 112/70  Ht 5' 3.75" (1.619 m)  Wt 217 lb (98.431 kg)  BMI 37.55 kg/m2  LMP 09/13/2013 Physical Exam  General:  Well developed, well nourished, no acute distress. She is alert and oriented x3. Skin:  Warm and dry, hyperpigmented slightly scaly area Rt hip (itchy area pt described) Neck:  Midline trachea, no thyromegaly or nodules Cardiovascular: Regular rate and rhythm, no murmur heard Lungs:  Effort normal, all lung fields clear to auscultation bilaterally Breasts:  No dominant palpable mass, retraction, or nipple discharge. Hyperpigmented smooth area above Lt areola (itchy area pt was describing). Co-exam w/ JAG- felt to be superficial.  Abdomen:  Soft, non tender, no hepatosplenomegaly or masses Pelvic:  External genitalia is normal in appearance.  The vagina is normal in appearance. The cervix is bulbous, no CMT.  Thin prep pap is done w/ HR HPV cotesting. Uterus is felt to be normal size, shape, and contour.  No adnexal masses noted. + tenderness w/ entire pelvic exam.  Rectal: Good sphincter tone, no polyps, or hemorrhoids felt.  Hemoccult: neg Extremities:  No swelling or varicosities noted Psych:  She has a normal mood and affect  Assessment:   Healthy well-woman exam Lt sided nocturnal paresthesias she has discussed w/ PCP Pelvic and Rt lower abdomen pain Possibly perimenopausal, 2 months w/o period Gas Constipation Possible eczema on breast/hip  Plan:  Try using triamcinolone and nystatin mixed on breast/hip bid x 2wks, if not better let us know or call dermatologist Call neurosurgeon Dr. Annette Stable to discuss paresthesias/MRI Gas X for gas Continue metamucil for constipation F/U 1wk for pelvic u/s and f/u w/ me after Mammogram: call to schedule screening mammo now  Colonoscopy @ 48yo or sooner if problems  Tawnya Crook CNM, [facility] 11/19/2013 9:35 AM

## 2013-11-20 LAB — CYTOLOGY - PAP

## 2013-11-26 ENCOUNTER — Ambulatory Visit: Payer: 59 | Admitting: Women's Health

## 2013-11-26 ENCOUNTER — Ambulatory Visit (INDEPENDENT_AMBULATORY_CARE_PROVIDER_SITE_OTHER): Payer: 59 | Admitting: Women's Health

## 2013-11-26 ENCOUNTER — Other Ambulatory Visit: Payer: Self-pay | Admitting: Women's Health

## 2013-11-26 ENCOUNTER — Other Ambulatory Visit: Payer: 59

## 2013-11-26 ENCOUNTER — Encounter: Payer: Self-pay | Admitting: Women's Health

## 2013-11-26 ENCOUNTER — Ambulatory Visit (INDEPENDENT_AMBULATORY_CARE_PROVIDER_SITE_OTHER): Payer: 59

## 2013-11-26 VITALS — BP 118/78 | Ht 63.75 in | Wt 216.0 lb

## 2013-11-26 DIAGNOSIS — R102 Pelvic and perineal pain: Secondary | ICD-10-CM

## 2013-11-26 DIAGNOSIS — R1011 Right upper quadrant pain: Secondary | ICD-10-CM

## 2013-11-26 DIAGNOSIS — R319 Hematuria, unspecified: Secondary | ICD-10-CM

## 2013-11-26 LAB — POCT URINALYSIS DIPSTICK
GLUCOSE UA: NEGATIVE
Ketones, UA: NEGATIVE
Leukocytes, UA: NEGATIVE
Nitrite, UA: NEGATIVE
Protein, UA: NEGATIVE

## 2013-11-26 NOTE — Progress Notes (Signed)
Patient ID: MAKYLIE RIVERE, female   DOB: 12-17-1965, 48 y.o.   MRN: [id number]   [facility] Visit  Patient name: [name] MRN [id number]  Date of birth: December 15, 1965  CC & HPI:  [name] is a 48 y.o. African American female presenting today for f/u after pelvic u/s for Rt pelvic/abdominal pain. States her spot on breast and hip are no longer itching since using combination of triamcinolone and nystatin- to continue using.  Describes pain as Rt mid abdominal/side radiating to back- is below liver/gb and above appendix/colon. Constant crampy pain x 1 month, will stay x 2-3d, then resolve and return. Peanuts seem to aggravate pain. Mild occ nausea, no vomiting, no fever/chills, no hematuria or uti s/s, pain not worsened by fatty/greasy foods.  Period did start last Thursday and just ended yesterday.    Pertinent History Reviewed:  Medical & Surgical Hx:   Past Medical History  Diagnosis Date  . Other symptoms involving digestive system(787.99)   . Headache(784.0)   . Other specified cardiac dysrhythmias(427.89)   . Abdominal pain, other specified site   . Other constipation   . Anxiety state, unspecified    Past Surgical History  Procedure Laterality Date  . Vaginal delivery  90, 97, 99, 03    x 4   . Hernia repair    . Tubal ligation    . Cervical disc surgery      Ruptured disc repair / and approach c5/6, c6/7  . Cardiac catheterization  12/09/03    Normal   Medications: Reviewed & Updated - see associated section Social History: Reviewed -  reports that she has never smoked. She has never used smokeless tobacco.  Objective Findings:  Vitals: BP 118/78  Ht 5' 3.75" (1.619 m)  Wt 216 lb (97.977 kg)  BMI 37.38 kg/m2  LMP 11/20/2013  Physical Examination: General appearance - alert, well appearing, and in no distress Abdomen - soft, nontender, nondistended, no masses or organomegaly No pain to palpation anywhere, no CVAT  Results for orders placed in  visit on 11/26/13 (from the past 24 hour(s))  POCT URINALYSIS DIPSTICK   Collection Time    11/26/13 10:17 AM      Result Value Ref Range   Color, UA yellow     Clarity, UA clear     Glucose, UA neg     Bilirubin, UA       Ketones, UA neg     Spec Grav, UA       Blood, UA small     pH, UA       Protein, UA neg     Urobilinogen, UA       Nitrite, UA neg     Leukocytes, UA Negative      Today's Pelvic U/S: Uterus 7.8 x 6.4 x 4.8 cm, retroverted with 2 small fibroids noted within = 1.0 & 1.1cm  Endometrium Walls=4.7 mm, symmetrical with small amount of fluid noted within fundal region of cavity (pt notes just finished cycle)  Right ovary 3.1 x 1.8 x 1.7 cm,  Left ovary 2.7 x 1.8 x 1.5 cm,  No free fluid noted  Technician Comments:  Retroverted uterus with 2 small fibroids notes, bilateral adnexa/ovaries appear WNl, no free fluid or adnexal masses noted, Endom-4.69mm with small amount of fluid noted within fundal region of cavity  Lazarus Gowda  11/26/2013  9:25 AM  Assessment & Plan:  A:   Rt abdominal pain of unknown origin  Resolving probable eczema of Lt breast/Rt hip P:  Discussed w/ JAG, will get complete abdominal u/s- scheduled for 10/29 @ 0800 at [facility], NPO x 8hrs prior  CBC, CMP ordered- pt unable to stay today to get- wants to get at [facility] tomorrow   Avoid peanuts  Will call her w/ results, f/u prn   Tawnya Crook CNM, [facility] 11/26/2013 10:38 AM

## 2013-11-26 NOTE — Patient Instructions (Signed)
Abdominal ultrasound at [facility] tomorrow 10/29 @ 8:00am, be there at 7:45am, nothing to eat or drink 8 hours before

## 2013-11-27 ENCOUNTER — Ambulatory Visit (HOSPITAL_COMMUNITY)
Admission: RE | Admit: 2013-11-27 | Discharge: 2013-11-27 | Disposition: A | Discharge status: Home / Self Care | Payer: 59 | Source: Ambulatory Visit | Attending: Women's Health | Admitting: Women's Health

## 2013-11-27 ENCOUNTER — Other Ambulatory Visit: Payer: Self-pay | Admitting: Women's Health

## 2013-11-27 DIAGNOSIS — K8020 Calculus of gallbladder without cholecystitis without obstruction: Secondary | ICD-10-CM | POA: Diagnosis not present

## 2013-11-27 DIAGNOSIS — R1011 Right upper quadrant pain: Secondary | ICD-10-CM

## 2013-11-27 DIAGNOSIS — K760 Fatty (change of) liver, not elsewhere classified: Secondary | ICD-10-CM | POA: Diagnosis not present

## 2013-11-27 LAB — COMPREHENSIVE METABOLIC PANEL
ALT: 15 U/L (ref 0–35)
AST: 14 U/L (ref 0–37)
Albumin: 4 g/dL (ref 3.5–5.2)
Alkaline Phosphatase: 71 U/L (ref 39–117)
BILIRUBIN TOTAL: 0.4 mg/dL (ref 0.2–1.2)
BUN: 15 mg/dL (ref 6–23)
CO2: 27 mEq/L (ref 19–32)
Calcium: 8.7 mg/dL (ref 8.4–10.5)
Chloride: 107 mEq/L (ref 96–112)
Creat: 0.79 mg/dL (ref 0.50–1.10)
Glucose, Bld: 91 mg/dL (ref 70–99)
Potassium: 3.8 mEq/L (ref 3.5–5.3)
SODIUM: 139 meq/L (ref 135–145)
TOTAL PROTEIN: 6.7 g/dL (ref 6.0–8.3)

## 2013-11-27 LAB — CBC
HCT: 37.3 % (ref 36.0–46.0)
Hemoglobin: 12.3 g/dL (ref 12.0–15.0)
MCH: 28.7 pg (ref 26.0–34.0)
MCHC: 33 g/dL (ref 30.0–36.0)
MCV: 86.9 fL (ref 78.0–100.0)
PLATELETS: 370 10*3/uL (ref 150–400)
RBC: 4.29 MIL/uL (ref 3.87–5.11)
RDW: 15.4 % (ref 11.5–15.5)
WBC: 6.1 10*3/uL (ref 4.0–10.5)

## 2013-12-01 ENCOUNTER — Encounter: Payer: Self-pay | Admitting: Women's Health

## 2013-12-01 ENCOUNTER — Telehealth: Payer: Self-pay | Admitting: Women's Health

## 2013-12-01 DIAGNOSIS — K8020 Calculus of gallbladder without cholecystitis without obstruction: Secondary | ICD-10-CM | POA: Insufficient documentation

## 2013-12-01 NOTE — Telephone Encounter (Signed)
Notified pt of normal labs, abdominal u/s reveals gallstones but no acute cholecystitis. Has pains at least 2x/wk, has to take ibuprofen around clock when they come. Prefers to go ahead and see surgeon, wants Dr. Arnoldo Morale. Will send referral today.  Roma Schanz, CNM, [facility] 12/01/2013 2:35 PM

## 2013-12-01 NOTE — Telephone Encounter (Signed)
Left message for pt to return call, need to discuss u/s & lab results.  Roma Schanz, CNM, [facility] 12/01/2013 9:49 AM

## 2014-03-31 ENCOUNTER — Telehealth: Payer: Self-pay | Admitting: Family Medicine

## 2014-03-31 NOTE — Telephone Encounter (Signed)
[facility]  Patient Name: [name]  DOB: Apr 13, 1965    Initial Comment Caller states she has sinuses that she has been dealing with for 1.5 weeks.   Nurse Assessment  Nurse: Chloe Emery, RN, [name] Date/Time [name] Time): 03/31/2014 4:15:00 PM  Confirm and document reason for call. If symptomatic, describe symptoms. ---[name] has bad sinus problems draining constant for 1.5 weeks goes away with OTC medications now stomach is getting irritated with the medications.  Has the patient traveled out of the country within the last 30 days? ---No  Does the patient require triage? ---Yes  Related visit to physician within the last 2 weeks? ---No  Does the PT have any chronic conditions? (i.e. diabetes, asthma, etc.) ---No  Did the patient indicate they were pregnant? ---No     Guidelines    Guideline Title Affirmed Question Affirmed Notes  Influenza - Seasonal [1] Using nasal washes and pain medicine > 24 hours AND [2] sinus pain (around cheekbone or eye) persists    Final Disposition User   See Physician within [place] [facility], [name], sports, [name]

## 2014-03-31 NOTE — Telephone Encounter (Signed)
Pt already has appt with Dr Deborra Medina on 04/01/14 at 11:45.

## 2014-04-01 ENCOUNTER — Encounter: Payer: Self-pay | Admitting: Family Medicine

## 2014-04-01 ENCOUNTER — Ambulatory Visit (INDEPENDENT_AMBULATORY_CARE_PROVIDER_SITE_OTHER): Payer: 59 | Admitting: Family Medicine

## 2014-04-01 VITALS — BP 118/64 | HR 71 | Temp 97.9°F | Wt 219.5 lb

## 2014-04-01 DIAGNOSIS — J0100 Acute maxillary sinusitis, unspecified: Secondary | ICD-10-CM | POA: Insufficient documentation

## 2014-04-01 DIAGNOSIS — J019 Acute sinusitis, unspecified: Secondary | ICD-10-CM | POA: Insufficient documentation

## 2014-04-01 DIAGNOSIS — J0110 Acute frontal sinusitis, unspecified: Secondary | ICD-10-CM

## 2014-04-01 MED ORDER — FLUTICASONE PROPIONATE 50 MCG/ACT NA SUSP: 2.0000 | Freq: Every day | NASAL | Status: DC

## 2014-04-01 MED ORDER — AMOXICILLIN 875 MG PO TABS: 875.0000 mg | ORAL_TABLET | Freq: Two times a day (BID) | ORAL | Status: DC

## 2014-04-01 NOTE — Patient Instructions (Signed)
Good to see you. Please take Amoxicillin as directed- 1 tablet twice daily for 10 days. Start flonase.  If you continue mucinex, please increase fluid intake.

## 2014-04-01 NOTE — Progress Notes (Signed)
Pre visit review using our clinic review tool, if applicable. No additional management support is needed unless otherwise documented below in the visit note. 

## 2014-04-01 NOTE — Assessment & Plan Note (Signed)
New- Given duration and progression of symptoms, will treat for bacterial sinusitis with 10 day course of Amoxicillin twice daily Flonase rx refilled. See AVS. Call or return to clinic prn if these symptoms worsen or fail to improve as anticipated. The patient indicates understanding of these issues and agrees with the plan.

## 2014-04-01 NOTE — Progress Notes (Signed)
Subjective:   Patient ID: [name], female    DOB: [birth year], 49 y.o.   MRN: [id number]  ZAKYAH YANES is a pleasant 49 y.o. year old female pt of Dr. Damita Dunnings, new to me who presents to clinic today with Nasal Congestion  on 04/01/2014  HPI:  Complains of almost 2 weeks of constant nasal drainage and sinus pressure.  Sudafed and Robitussin seem to improve symptoms.  Stopped rx because she had been taking for over a week and symptoms returned immediately.  Started mucinex which has not helped her symptoms other than helping her cough up phlgemn. Mucinex also caused upset stomach.  Has had some blood tinged nasal mucous and did cough a little up this morning.  +subjective fever, chills.  PMH signficant for sulfa allergy.  Has taken flonase in past but has not yet restarted this.  Current Outpatient Prescriptions on File Prior to Visit  Medication Sig Dispense Refill  . cyclobenzaprine (FLEXERIL) 10 MG tablet Take 10 mg by mouth 3 (three) times daily as needed for muscle spasms.    Marland Kitchen ibuprofen (ADVIL,MOTRIN) 600 MG tablet Take 1 tablet (600 mg total) by mouth every 6 (six) hours as needed for pain. 30 tablet 0  . multivitamin (THERAGRAN) per tablet Take 1 tablet by mouth daily.     Marland Kitchen nystatin cream (MYCOSTATIN) Apply 1 application topically 2 (two) times daily. Use until the rash is resolved and then for 3 more days. 30 g 0  . Omega-3 Fatty Acids (FISH OIL PO) Take 1 tablet by mouth daily.    Marland Kitchen triamcinolone cream (KENALOG) 0.1 % Apply 1 application topically 2 (two) times daily. For itching     No current facility-administered medications on file prior to visit.    Allergies  Allergen Reactions  . Sulfonamide Derivatives Rash    Past Medical History  Diagnosis Date  . Other symptoms involving digestive system(787.99)   . Headache(784.0)   . Other specified cardiac dysrhythmias(427.89)   . Abdominal pain, other specified site   . Other constipation   . Anxiety  state, unspecified     Past Surgical History  Procedure Laterality Date  . Vaginal delivery  90, 97, 99, 03    x 4   . Hernia repair    . Tubal ligation    . Cervical disc surgery      Ruptured disc repair / and approach c5/6, c6/7  . Cardiac catheterization  12/09/03    Normal    Family History  Problem Relation Age of Onset  . Deep vein thrombosis Mother     crebral blood clot   . Heart failure Father   . Depression Maternal Uncle   . Diabetes Maternal Grandmother   . Stroke Maternal Grandmother   . Cancer Maternal Grandfather     Prostate   . Hypertension Neg Hx     History   Social History  . Marital Status: Married    Spouse Name: [name]  . Number of Children: 4  . Years of Education: 14   Occupational History  . 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 Smoking status: Never Smoker   . Smokeless tobacco: Never Used  . Alcohol Use: No  . Drug Use: No  . Sexual Activity: Yes   Other Topics Concern  . Not on file   Social History Narrative   Patient lives with  her husband [name] ) and her children.   Patient has four children.   Patient works full-time.   Patient has a college education.   Patient is right-handed.   Patient drinks caffeine- once or twice a week.   The PMH, PSH, Social History, Family History, Medications, and allergies have been reviewed in [facility], and have been updated if relevant.  Review of Systems  Constitutional: Positive for fever and chills.  HENT: Positive for congestion, postnasal drip, rhinorrhea, sinus pressure and sore throat. Negative for ear pain, facial swelling, mouth sores, trouble swallowing and voice change.   Eyes: Negative.   Respiratory: Positive for cough. Negative for chest tightness and wheezing.   Cardiovascular: Negative.   Gastrointestinal: Positive for constipation.  Genitourinary: Negative.   Musculoskeletal: Negative.   Skin: Negative.   All other  systems reviewed and are negative.      Objective:    BP 118/64 mmHg  Pulse 71  Temp(Src) 97.9 F (36.6 C) (Oral)  Wt 219 lb 8 oz (99.565 kg)  SpO2 98%  LMP 03/12/2014   Physical Exam  Constitutional: She appears well-developed and well-nourished. No distress.  HENT:  Head: Normocephalic and atraumatic.  Right Ear: Hearing and tympanic membrane normal.  Left Ear: Hearing and tympanic membrane normal.  Nose: Mucosal edema and rhinorrhea present. Right sinus exhibits frontal sinus tenderness. Right sinus exhibits no maxillary sinus tenderness. Left sinus exhibits frontal sinus tenderness.  Cardiovascular: Normal rate, regular rhythm and normal heart sounds.   Pulmonary/Chest: Effort normal and breath sounds normal. No respiratory distress. She has no wheezes. She has no rales.  Musculoskeletal: Normal range of motion.  Neurological: She is alert. No cranial nerve deficit.  Skin: Skin is warm and dry.  Psychiatric: She has a normal mood and affect. Her behavior is normal. Judgment and thought content normal.  Nursing note and vitals reviewed.         Assessment & Plan:   Acute frontal sinusitis, recurrence not specified No Follow-up on file.

## 2014-04-14 ENCOUNTER — Telehealth: Payer: Self-pay

## 2014-04-14 NOTE — Telephone Encounter (Signed)
Pt was seen 04/01/14 and took abx 7 of the 10 day rx. Pt forgot to take some of abx but pt also began to fill better and pt did not complete abx course, Now pt has head congestion, drainage from ears, scratchy throat, red and watery eyes but no fever or cough. Pt wants to know what to do. Pt request cb. [facility].

## 2014-04-14 NOTE — Telephone Encounter (Signed)
W/o a fever, I would restart (or continue) the flonase and given this a few more days.  Thanks.

## 2014-04-14 NOTE — Telephone Encounter (Signed)
Patient advised.

## 2014-04-16 ENCOUNTER — Emergency Department (HOSPITAL_COMMUNITY): Admission: EM | Admit: 2014-04-16 | Discharge: 2014-04-16 | Payer: 59 | Source: Home / Self Care

## 2014-04-16 ENCOUNTER — Ambulatory Visit (INDEPENDENT_AMBULATORY_CARE_PROVIDER_SITE_OTHER): Payer: 59 | Admitting: Family Medicine

## 2014-04-16 ENCOUNTER — Encounter: Payer: Self-pay | Admitting: Family Medicine

## 2014-04-16 VITALS — BP 116/90 | HR 122 | Temp 98.9°F | Ht 63.75 in | Wt 218.8 lb

## 2014-04-16 DIAGNOSIS — H10023 Other mucopurulent conjunctivitis, bilateral: Secondary | ICD-10-CM

## 2014-04-16 DIAGNOSIS — G4489 Other headache syndrome: Secondary | ICD-10-CM

## 2014-04-16 DIAGNOSIS — J0101 Acute recurrent maxillary sinusitis: Secondary | ICD-10-CM

## 2014-04-16 DIAGNOSIS — R21 Rash and other nonspecific skin eruption: Secondary | ICD-10-CM

## 2014-04-16 MED ORDER — POLYMYXIN B-TRIMETHOPRIM 10000-0.1 UNIT/ML-% OP SOLN: 1.0000 [drp] | OPHTHALMIC | Status: DC

## 2014-04-16 MED ORDER — LEVOFLOXACIN 500 MG PO TABS: 500.0000 mg | ORAL_TABLET | Freq: Every day | ORAL | Status: DC

## 2014-04-16 NOTE — Progress Notes (Signed)
Pre visit review using our clinic review tool, if applicable. No additional management support is needed unless otherwise documented below in the visit note. 

## 2014-04-16 NOTE — Progress Notes (Signed)
Dr. Frederico Hamman T. Rosine Solecki, MD, [facility] [place], [address] Phone: [phone] Fax: [phone]  04/16/2014  Patient: [name], MRN: [id number], DOB: December 18, 1965, 49 y.o.  Primary Physician:  Elsie Stain, MD  Chief Complaint: Headache  Subjective:   [name] is a 49 y.o. very pleasant female patient who presents with the following:  The patient was seen on April 01, 2014, and she was diagnosed sinusitis.  She was treated with low-dose amoxicillin, 875 mg twice a day for 10 days.  She did not finish all of her medication, and she had a return of frontal facial pain and headache that has become quite severe.  She also has bilateral pinkish conjunctiva with significant tearing and some crusting occasionally.  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  Within the last day or 2 she also started to develop some faint rash on her chest and torso.  Monday, started with itchy throat.  Called MD, and called flonase.   Eyes are hurting and pain in her face and jaws.     Past Medical History, Surgical History, Social History, Family History, Problem List, Medications, and Allergies have been reviewed and updated if relevant.  ROS: GEN: Acute illness details above GI: Tolerating PO intake GU: maintaining adequate hydration and urination Pulm: No SOB Interactive and getting along well at home.  Otherwise, ROS is as per the HPI.   Objective:   BP 116/90 mmHg  Pulse 122  Temp(Src) 98.9 F (37.2 C) (Oral)  Ht 5' 3.75" (1.619 m)  Wt 218 lb 12.8 oz (99.247 kg)  BMI 37.86 kg/m2  SpO2 94%  LMP 03/12/2014   Gen: WDWN, NAD; alert,appropriate and cooperative throughout exam  HEENT: Normocephalic and atraumatic. Throat clear, w/o exudate, no LAD, R TM clear, L TM - good landmarks, No fluid present. rhinnorhea. Conjunctiva injected Left frontal and maxillary sinuses: Tender, max Right frontal  and maxillary sinuses: Tender, max  Neck: No ant or post LAD CV: RRR, No M/G/R Pulm: Breathing comfortably in no resp distress. no w/c/r Abd: S,NT,ND,+BS Extr: no c/c/e Psych: full affect, pleasant   Scant thin reddish, flat rash torso, chest   Laboratory and Imaging Data:  Assessment and Plan:   Acute recurrent maxillary sinusitis  Pink eye, bilateral  Other headache syndrome  Rash and nonspecific skin eruption  Treatment failure sinusitis. Low dose and lack of completion of amox likely playing a role.  Clearly conjunctivitis.  Rash, more likely viral. Less likely drug reaction. Has taken PCN for 50 years without difficulty  Follow-up: No Follow-up on file.  New Prescriptions   LEVOFLOXACIN (LEVAQUIN) 500 MG TABLET    Take 1 tablet (500 mg total) by mouth daily.   TRIMETHOPRIM-POLYMYXIN B (POLYTRIM) OPHTHALMIC SOLUTION    Place 1 drop into both eyes every 4 (four) hours.   No orders of the defined types were placed in this encounter.    Signed,  Maud Deed. Markan Cazarez, MD   Patient's Medications  New Prescriptions   LEVOFLOXACIN (LEVAQUIN) 500 MG TABLET    Take 1 tablet (500 mg total) by mouth daily.   TRIMETHOPRIM-POLYMYXIN B (POLYTRIM) OPHTHALMIC SOLUTION    Place 1 drop into both eyes every 4 (four) hours.  Previous Medications   DIPHENHYDRAMINE (BENADRYL) 25 MG TABLET    Take 25 mg by mouth every 6 (six) hours as needed.   FLUTICASONE (FLONASE) 50 MCG/ACT NASAL SPRAY  Place 2 sprays into both nostrils daily.   IBUPROFEN (ADVIL,MOTRIN) 600 MG TABLET    Take 1 tablet (600 mg total) by mouth every 6 (six) hours as needed for pain.   NON FORMULARY    Paractin for joint pain.   NYSTATIN CREAM (MYCOSTATIN)    Apply 1 application topically 2 (two) times daily. Use until the rash is resolved and then for 3 more days.   PSEUDOEPHEDRINE (SUDAFED) 30 MG TABLET    Take 30 mg by mouth every 4 (four) hours as needed for congestion.   TRIAMCINOLONE CREAM (KENALOG) 0.1 %     Apply 1 application topically 2 (two) times daily. For itching  Modified Medications   No medications on file  Discontinued Medications   AMOXICILLIN (AMOXIL) 875 MG TABLET    Take 1 tablet (875 mg total) by mouth 2 (two) times daily.   CYCLOBENZAPRINE (FLEXERIL) 10 MG TABLET    Take 10 mg by mouth 3 (three) times daily as needed for muscle spasms.   MULTIVITAMIN (THERAGRAN) PER TABLET    Take 1 tablet by mouth daily.    OMEGA-3 FATTY ACIDS (FISH OIL PO)    Take 1 tablet by mouth daily.

## 2014-05-14 ENCOUNTER — Encounter: Payer: Self-pay | Admitting: Internal Medicine

## 2014-05-14 ENCOUNTER — Ambulatory Visit (INDEPENDENT_AMBULATORY_CARE_PROVIDER_SITE_OTHER): Payer: 59 | Admitting: Internal Medicine

## 2014-05-14 VITALS — BP 118/76 | HR 71 | Temp 98.0°F | Wt 216.0 lb

## 2014-05-14 DIAGNOSIS — R350 Frequency of micturition: Secondary | ICD-10-CM | POA: Diagnosis not present

## 2014-05-14 LAB — POCT URINALYSIS DIPSTICK
Bilirubin, UA: NEGATIVE
GLUCOSE UA: NEGATIVE
KETONES UA: NEGATIVE
LEUKOCYTES UA: NEGATIVE
Nitrite, UA: NEGATIVE
Protein, UA: NEGATIVE
Urobilinogen, UA: NEGATIVE
pH, UA: 6

## 2014-05-14 LAB — BASIC METABOLIC PANEL
BUN: 12 mg/dL (ref 6–23)
CO2: 29 meq/L (ref 19–32)
Calcium: 8.9 mg/dL (ref 8.4–10.5)
Chloride: 105 mEq/L (ref 96–112)
Creatinine, Ser: 0.73 mg/dL (ref 0.40–1.20)
GFR: 108.71 mL/min (ref >60.00)
Glucose, Bld: 97 mg/dL (ref 70–99)
Potassium: 3.9 mEq/L (ref 3.5–5.1)
Sodium: 136 mEq/L (ref 135–145)

## 2014-05-14 LAB — HEMOGLOBIN A1C: Hgb A1c MFr Bld: 5.5 % (ref 4.6–6.5)

## 2014-05-14 NOTE — Progress Notes (Signed)
HPI  Pt presents to the clinic today with c/o urinary frequency. She reports this started 3 days ago. She does have some associated low back pain, urine odor and burning in her groin area. She denies abdominal pain, fever, chills, nausea or vaginal discharge. She has tried Monistat OTC without any relief. She reports she is drinking plenty of water and some caffeine.    Review of Systems  Past Medical History  Diagnosis Date  . Other symptoms involving digestive system(787.99)   . Headache(784.0)   . Other specified cardiac dysrhythmias(427.89)   . Abdominal pain, other specified site   . Other constipation   . Anxiety state, unspecified     Family History  Problem Relation Age of Onset  . Deep vein thrombosis Mother     crebral blood clot   . Heart failure Father   . Depression Maternal Uncle   . Diabetes Maternal Grandmother   . Stroke Maternal Grandmother   . Cancer Maternal Grandfather     Prostate   . Hypertension Neg Hx     History   Social History  . Marital Status: Married    Spouse Name: [name]  . Number of Children: 4  . Years of Education: 14   Occupational History  . [employer] History Main Topics  . Smoking status: Never Smoker   . Smokeless tobacco: Never Used  . Alcohol Use: No  . Drug Use: No  . Sexual Activity: Yes   Other Topics Concern  . Not on file   Social History Narrative   Patient lives with her husband [name] ) and her children.   Patient has four children.   Patient works full-time.   Patient has a college education.   Patient is right-handed.   Patient drinks caffeine- once or twice a week.    Allergies  Allergen Reactions  . Sulfonamide Derivatives Rash    Constitutional: Denies fever, malaise, fatigue, headache or abrupt weight changes.   GU: Pt reports frequency and urine odor. Denies frequency, blood in urine, pain with urination or discharge. Skin: Denies  redness, rashes, lesions or ulcercations.   No other specific complaints in a complete review of systems (except as listed in HPI above).    Objective:   Physical Exam  Pulse 71  Temp(Src) 98 F (36.7 C) (Oral)  Wt 216 lb (97.977 kg)  SpO2 98%  LMP 03/15/2014 Wt Readings from Last 3 Encounters:  05/14/14 216 lb (97.977 kg)  04/16/14 218 lb 12.8 oz (99.247 kg)  04/01/14 219 lb 8 oz (99.565 kg)    General: Appears her stated age, obese in NAD. Cardiovascular: Normal rate and rhythm. S1,S2 noted.  No murmur, rubs or gallops noted.  Pulmonary/Chest: Normal effort and positive vesicular breath sounds. No respiratory distress. No wheezes, rales or ronchi noted.  Abdomen: Soft and nontender. Normal bowel sounds, no bruits noted. No distention or masses noted. No CVA tenderness. Pelvic: Normal female anatomy. No rash or irritation noted in the groin or external labia. No discharge noted.     Assessment & Plan:   Urinary Frequency:  Urinalysis: trace blood ? If the frequency is coming from her water intake. She does have some leakage from stress incontinence which is likely irritating her perineal area and causing the burning sensation. Discussed timed voiding and wearing an incontinence pad to help avoid irritation. Avoid caffeine Will check BMET and  A1C today  Drink plenty of fluids  RTC as needed or if symptoms persist.

## 2014-05-14 NOTE — Patient Instructions (Signed)

## 2014-05-14 NOTE — Progress Notes (Signed)
Pre visit review using our clinic review tool, if applicable. No additional management support is needed unless otherwise documented below in the visit note. 

## 2014-06-24 ENCOUNTER — Other Ambulatory Visit (HOSPITAL_COMMUNITY): Payer: Self-pay | Admitting: Neurosurgery

## 2014-06-24 DIAGNOSIS — M5412 Radiculopathy, cervical region: Secondary | ICD-10-CM

## 2014-06-24 DIAGNOSIS — M5416 Radiculopathy, lumbar region: Secondary | ICD-10-CM

## 2014-07-09 ENCOUNTER — Ambulatory Visit (HOSPITAL_COMMUNITY): Payer: 59

## 2014-07-09 ENCOUNTER — Ambulatory Visit (HOSPITAL_COMMUNITY)
Admission: RE | Admit: 2014-07-09 | Discharge: 2014-07-09 | Disposition: A | Discharge status: Home / Self Care | Payer: 59 | Source: Ambulatory Visit | Attending: Neurosurgery | Admitting: Neurosurgery

## 2014-07-09 DIAGNOSIS — M5412 Radiculopathy, cervical region: Secondary | ICD-10-CM

## 2014-07-09 DIAGNOSIS — M5416 Radiculopathy, lumbar region: Secondary | ICD-10-CM

## 2014-07-15 ENCOUNTER — Ambulatory Visit (HOSPITAL_COMMUNITY)
Admission: RE | Admit: 2014-07-15 | Discharge: 2014-07-15 | Disposition: A | Discharge status: Home / Self Care | Payer: 59 | Source: Ambulatory Visit | Attending: Neurosurgery | Admitting: Neurosurgery

## 2014-07-15 DIAGNOSIS — M129 Arthropathy, unspecified: Secondary | ICD-10-CM | POA: Insufficient documentation

## 2014-07-15 DIAGNOSIS — Z981 Arthrodesis status: Secondary | ICD-10-CM | POA: Diagnosis not present

## 2014-07-15 DIAGNOSIS — M7138 Other bursal cyst, other site: Secondary | ICD-10-CM | POA: Diagnosis not present

## 2014-07-15 DIAGNOSIS — M5412 Radiculopathy, cervical region: Secondary | ICD-10-CM | POA: Diagnosis present

## 2014-07-15 DIAGNOSIS — M5117 Intervertebral disc disorders with radiculopathy, lumbosacral region: Secondary | ICD-10-CM | POA: Insufficient documentation

## 2014-07-15 DIAGNOSIS — M2548 Effusion, other site: Secondary | ICD-10-CM | POA: Insufficient documentation

## 2014-07-16 ENCOUNTER — Ambulatory Visit (HOSPITAL_COMMUNITY): Admission: RE | Admit: 2014-07-16 | Payer: 59 | Source: Ambulatory Visit

## 2014-07-28 ENCOUNTER — Ambulatory Visit: Payer: 59 | Admitting: Women's Health

## 2014-08-05 ENCOUNTER — Ambulatory Visit: Payer: 59 | Admitting: Women's Health

## 2014-08-25 IMAGING — US US PELVIS COMPLETE
1 series · 14 of 25 positions shown · non-contrast
Comparison: 09/09/2010 CT scan

CLINICAL DATA: pelvic pain



[Series 1: us pelvis complete · 14 of 44 slices shown]
[im 1/44]
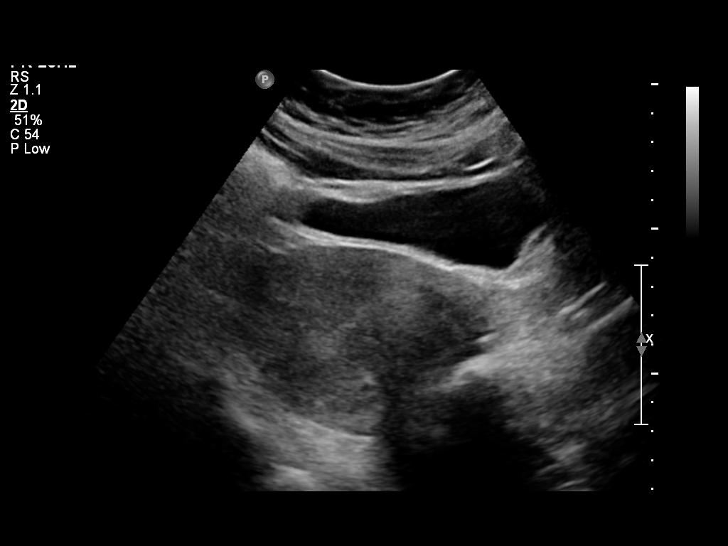
[im 4/44]
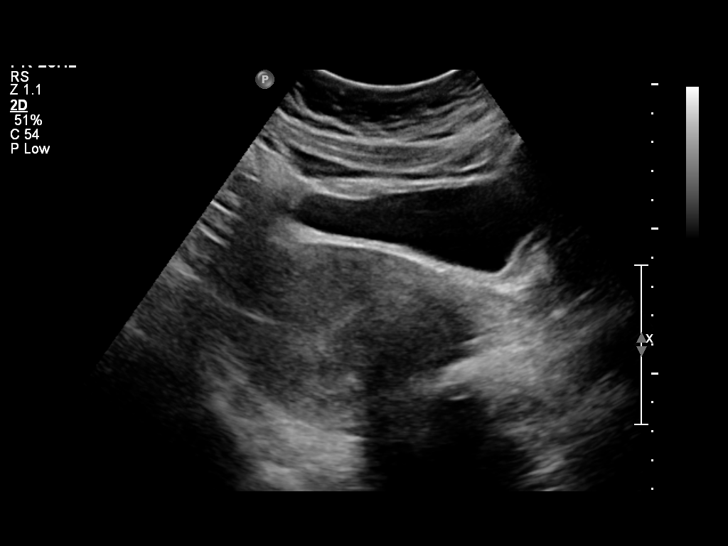
[im 8/44]
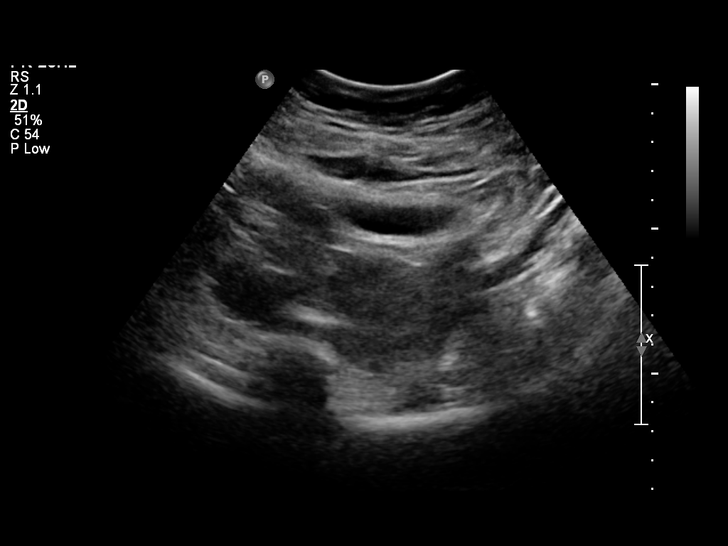
[im 11/44]
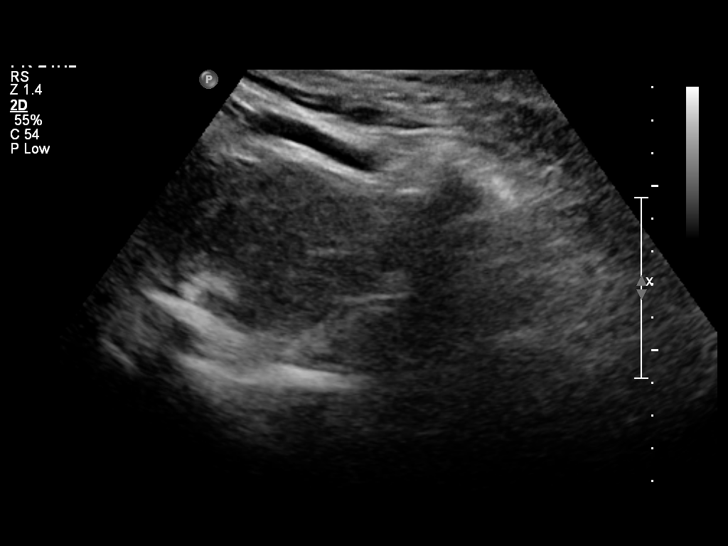
[im 15/44]
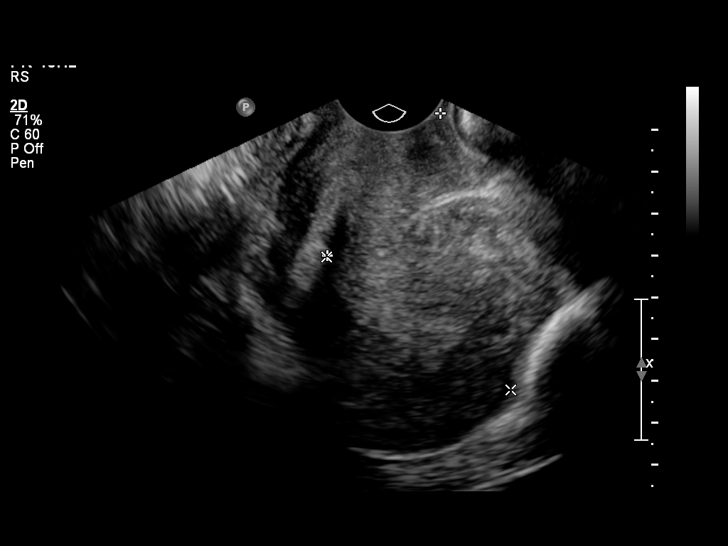
[im 17/44]
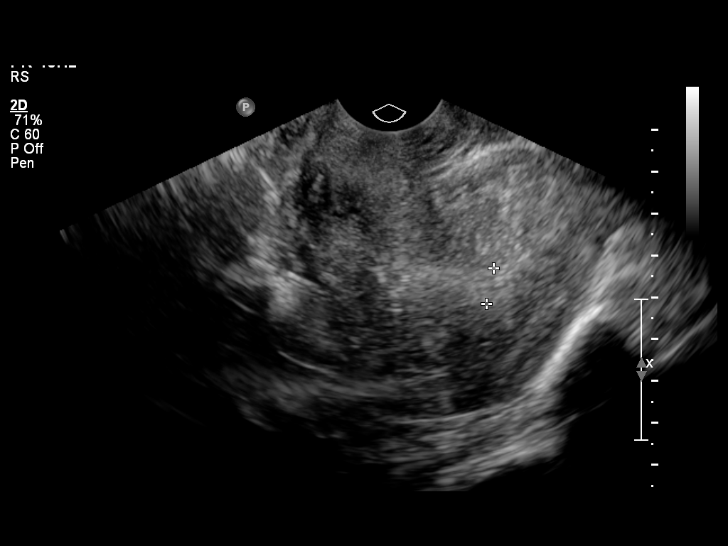
[im 20/44]
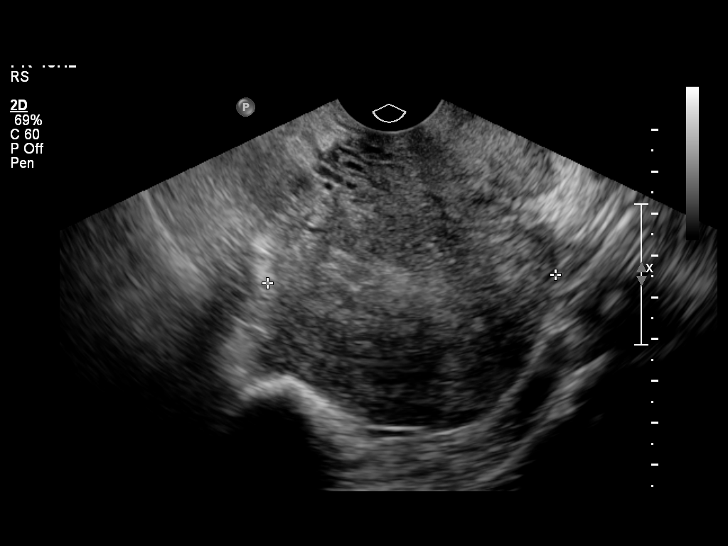
[im 24/44]
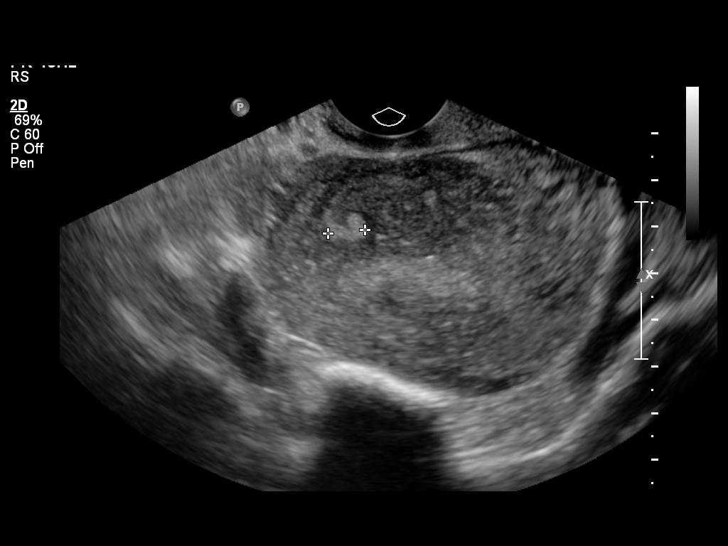
[im 27/44]
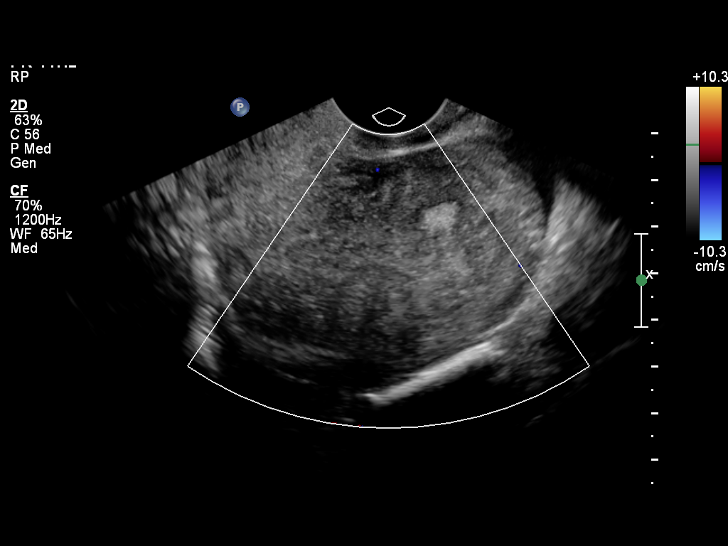
[im 29/44]
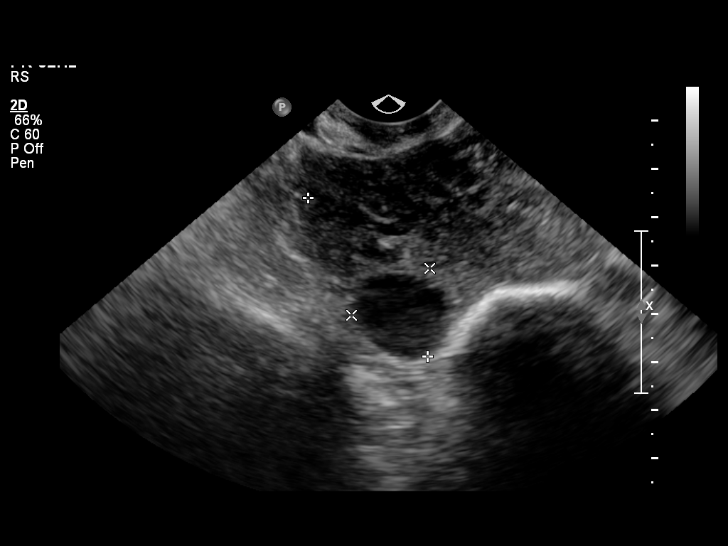
[im 33/44]
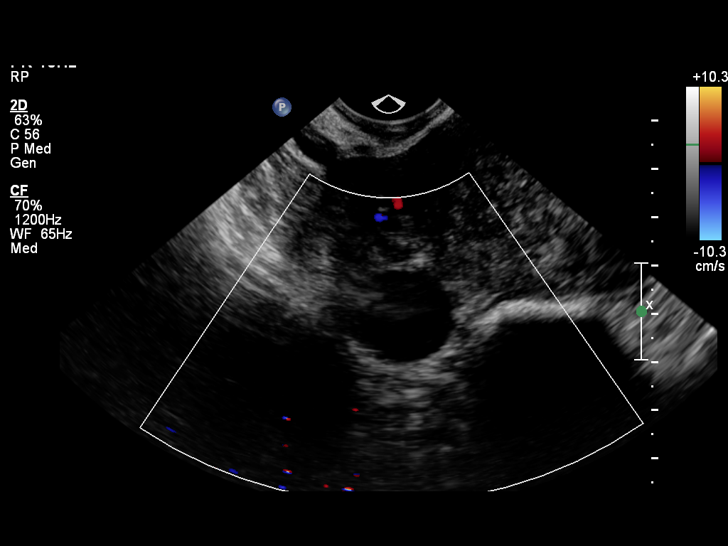
[im 36/44]
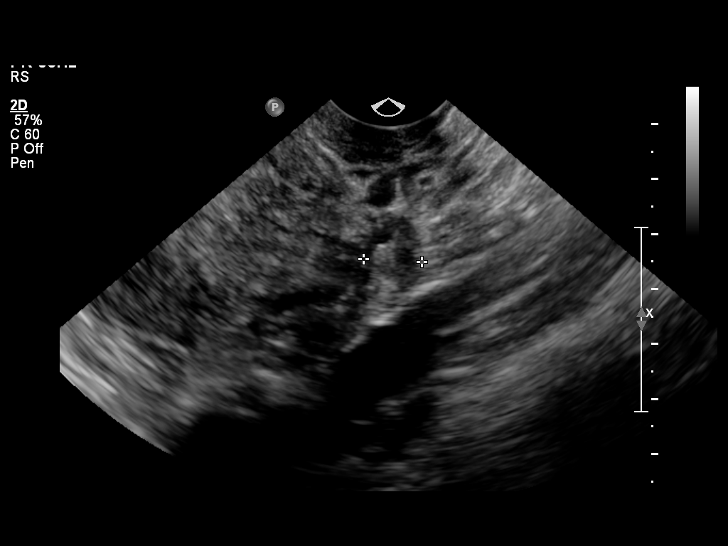
[im 40/44]
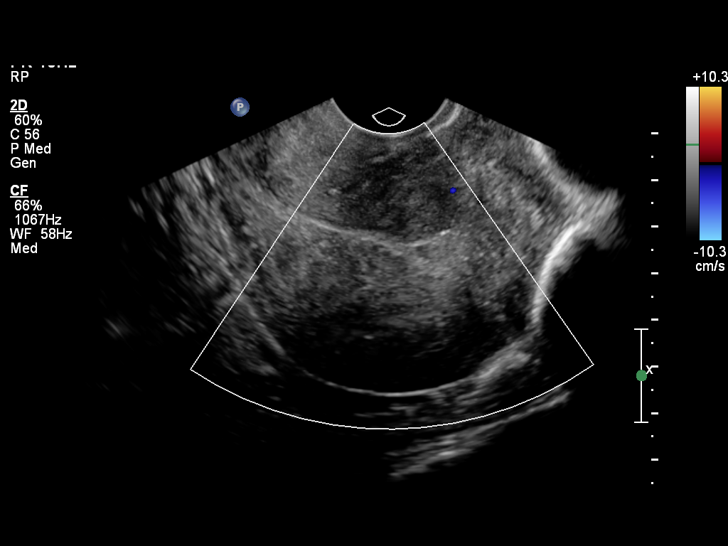
[im 44/44]
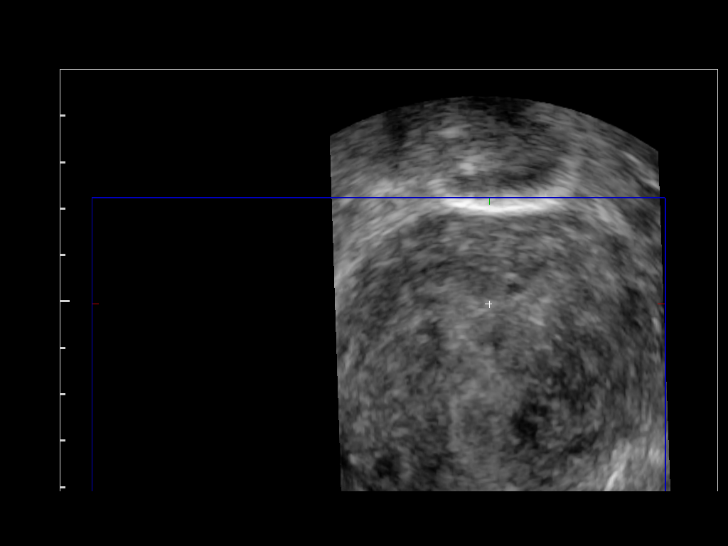

[14 of 25 positions shown; findings below may reference images not displayed]

FINDINGS: Uterus:  Normal in size and appearance

Endometrium: Appears normal, and measures 10 mm in thickness

Right ovary: Normal appearance with 2 cm simple cyst

Left ovary: Normal appearance/no adnexal mass

Other Findings:  There is a 9 x 6 x 8 mm oval echogenic lesion
within the myometrium of the right uterine fundus
IMPRESSION: No acute findings.  2 cm simple cyst right ovary incidentally
noted.  1 cm oval circumscribed echogenic mass myometrium right
fundus, of nonspecific origin.  A small fibroid with some calcium
deposition could have this appearance.

## 2014-12-17 ENCOUNTER — Encounter (HOSPITAL_COMMUNITY): Payer: Self-pay | Admitting: Neurology

## 2014-12-17 ENCOUNTER — Emergency Department (HOSPITAL_COMMUNITY)
Admission: EM | Admit: 2014-12-17 | Discharge: 2014-12-17 | Disposition: A | Discharge status: Home / Self Care | Payer: 59 | Attending: Emergency Medicine | Admitting: Emergency Medicine

## 2014-12-17 DIAGNOSIS — Z8659 Personal history of other mental and behavioral disorders: Secondary | ICD-10-CM | POA: Insufficient documentation

## 2014-12-17 DIAGNOSIS — N938 Other specified abnormal uterine and vaginal bleeding: Secondary | ICD-10-CM | POA: Diagnosis present

## 2014-12-17 DIAGNOSIS — Z8679 Personal history of other diseases of the circulatory system: Secondary | ICD-10-CM | POA: Insufficient documentation

## 2014-12-17 DIAGNOSIS — N939 Abnormal uterine and vaginal bleeding, unspecified: Secondary | ICD-10-CM

## 2014-12-17 DIAGNOSIS — Z9889 Other specified postprocedural states: Secondary | ICD-10-CM | POA: Diagnosis not present

## 2014-12-17 DIAGNOSIS — Z8719 Personal history of other diseases of the digestive system: Secondary | ICD-10-CM | POA: Insufficient documentation

## 2014-12-17 LAB — CBC WITH DIFFERENTIAL/PLATELET
BASOS ABS: 0 10*3/uL (ref 0.0–0.1)
BASOS PCT: 0 %
EOS PCT: 1 %
Eosinophils Absolute: 0.1 10*3/uL (ref 0.0–0.7)
HCT: 36 % (ref 36.0–46.0)
Hemoglobin: 11.8 g/dL — ABNORMAL LOW (ref 12.0–15.0)
Lymphocytes Relative: 31 %
Lymphs Abs: 2.3 10*3/uL (ref 0.7–4.0)
MCH: 29.1 pg (ref 26.0–34.0)
MCHC: 32.8 g/dL (ref 30.0–36.0)
MCV: 88.9 fL (ref 78.0–100.0)
Monocytes Absolute: 0.4 10*3/uL (ref 0.1–1.0)
Monocytes Relative: 5 %
Neutro Abs: 4.7 10*3/uL (ref 1.7–7.7)
Neutrophils Relative %: 63 %
PLATELETS: 343 10*3/uL (ref 150–400)
RBC: 4.05 MIL/uL (ref 3.87–5.11)
RDW: 14.6 % (ref 11.5–15.5)
WBC: 7.4 10*3/uL (ref 4.0–10.5)

## 2014-12-17 LAB — BASIC METABOLIC PANEL
ANION GAP: 7 (ref 5–15)
BUN: 10 mg/dL (ref 6–20)
CO2: 22 mmol/L (ref 22–32)
Calcium: 8.4 mg/dL — ABNORMAL LOW (ref 8.9–10.3)
Chloride: 110 mmol/L (ref 101–111)
Creatinine, Ser: 0.76 mg/dL (ref 0.44–1.00)
GFR calc Af Amer: >60 mL/min (ref >60)
Glucose, Bld: 110 mg/dL — ABNORMAL HIGH (ref 65–99)
POTASSIUM: 3.5 mmol/L (ref 3.5–5.1)
Sodium: 139 mmol/L (ref 135–145)

## 2014-12-17 LAB — I-STAT BETA HCG BLOOD, ED (MC, WL, AP ONLY): I-stat hCG, quantitative: <5 m[IU]/mL (ref <5)

## 2014-12-17 MED ORDER — HYDROCODONE-ACETAMINOPHEN 5-325 MG PO TABS: 2.0000 | ORAL_TABLET | ORAL | Status: DC | PRN

## 2014-12-17 MED ORDER — HYDROCODONE-ACETAMINOPHEN 5-325 MG PO TABS
2.0000 | ORAL_TABLET | Freq: Once | ORAL | Status: AC
  Administered 2014-12-17: 2 via ORAL
  Filled 2014-12-17: qty 2

## 2014-12-17 MED ORDER — ACETAMINOPHEN 325 MG PO TABS
650.0000 mg | ORAL_TABLET | Freq: Once | ORAL | Status: AC | PRN
  Administered 2014-12-17: 650 mg via ORAL

## 2014-12-17 MED ORDER — SODIUM CHLORIDE 0.9 % IV BOLUS (SEPSIS)
1000.0000 mL | Freq: Once | INTRAVENOUS | Status: AC
  Administered 2014-12-17: 1000 mL via INTRAVENOUS

## 2014-12-17 MED ORDER — ACETAMINOPHEN 325 MG PO TABS
ORAL_TABLET | ORAL | Status: AC
  Filled 2014-12-17: qty 2

## 2014-12-17 NOTE — ED Notes (Signed)
Discharge instructions and prescription reviewed - voiced understanding.  

## 2014-12-17 NOTE — Discharge Instructions (Signed)
Abnormal Uterine Bleeding Abnormal uterine bleeding can affect women at various stages in life, including teenagers, women in their reproductive years, pregnant women, and women who have reached menopause. Several kinds of uterine bleeding are considered abnormal, including:  Bleeding or spotting between periods.   Bleeding after sexual intercourse.   Bleeding that is heavier or more than normal.   Periods that last longer than usual.  Bleeding after menopause.  Many cases of abnormal uterine bleeding are minor and simple to treat, while others are more serious. Any type of abnormal bleeding should be evaluated by your health care provider. Treatment will depend on the cause of the bleeding. HOME CARE INSTRUCTIONS Monitor your condition for any changes. The following actions may help to alleviate any discomfort you are experiencing:  Avoid the use of tampons and douches as directed by your health care provider.  Change your pads frequently. You should get regular pelvic exams and Pap tests. Keep all follow-up appointments for diagnostic tests as directed by your health care provider.  SEEK MEDICAL CARE IF:   Your bleeding lasts more than 1 week.   You feel dizzy at times.  SEEK IMMEDIATE MEDICAL CARE IF:   You pass out.   You are changing pads every 15 to 30 minutes.   You have abdominal pain.  You have a fever.   You become sweaty or weak.   You are passing large blood clots from the vagina.   You start to feel nauseous and vomit. MAKE SURE YOU:   Understand these instructions.  Will watch your condition.  Will get help right away if you are not doing well or get worse.   This information is not intended to replace advice given to you by your health care provider. Make sure you discuss any questions you have with your health care provider.   Document Released: 01/16/2005 Document Revised: 01/21/2013 Document Reviewed: 08/15/2012 Elsevier Interactive  Patient Education 2016 Elsevier Inc.  Dysfunctional Uterine Bleeding Dysfunctional uterine bleeding is abnormal bleeding from the uterus. Dysfunctional uterine bleeding includes:  A period that comes earlier or later than usual.  A period that is lighter, heavier, or has blood clots.  Bleeding between periods.  Skipping one or more periods.  Bleeding after sexual intercourse.  Bleeding after menopause. HOME CARE INSTRUCTIONS  Pay attention to any changes in your symptoms. Follow these instructions to help with your condition: Eating  Eat well-balanced meals. Include foods that are high in iron, such as liver, meat, shellfish, green leafy vegetables, and eggs.  If you become constipated:  Drink plenty of water.  Eat fruits and vegetables that are high in water and fiber, such as spinach, carrots, raspberries, apples, and mango. Medicines  Take over-the-counter and prescription medicines only as told by your health care provider.  Do not change medicines without talking with your health care provider.  Aspirin or medicines that contain aspirin may make the bleeding worse. Do not take those medicines:  During the week before your period.  During your period.  If you were prescribed iron pills, take them as told by your health care provider. Iron pills help to replace iron that your body loses because of this condition. Activity  If you need to change your sanitary pad or tampon more than one time every 2 hours:  Lie in bed with your feet raised (elevated).  Place a cold pack on your lower abdomen.  Rest as much as possible until the bleeding stops or slows down.  Do not try to lose weight until the bleeding has stopped and your blood iron level is back to normal. Other Instructions  For two months, write down:  When your period starts.  When your period ends.  When any abnormal bleeding occurs.  What problems you notice.  Keep all follow up visits as told  by your health care provider. This is important. SEEK MEDICAL CARE IF:  You get light-headed or weak.  You have nausea and vomiting.  You cannot eat or drink without vomiting.  You feel dizzy or have diarrhea while you are taking medicines.  You are taking birth control pills or hormones, and you want to change them or stop taking them. SEEK IMMEDIATE MEDICAL CARE IF:  You develop a fever or chills.  You need to change your sanitary pad or tampon more than one time per hour.  Your bleeding becomes heavier, or your flow contains clots more often.  You develop pain in your abdomen.  You lose consciousness.  You develop a rash.   This information is not intended to replace advice given to you by your health care provider. Make sure you discuss any questions you have with your health care provider.   Document Released: 01/14/2000 Document Revised: 10/07/2014 Document Reviewed: 04/13/2014 Elsevier Interactive Patient Education [employer].

## 2014-12-17 NOTE — ED Notes (Signed)
Pt oob to br for specimen with steady gait

## 2014-12-17 NOTE — ED Notes (Signed)
Pt reports menstrual cycle since Tuesday and since yesterday heavy bleeding. Today has changed her pad 5 times. Reports she feels weak. Pt is a x 4

## 2014-12-17 NOTE — ED Provider Notes (Signed)
Arrival Date & Time: 12/17/14 & 1340 History   Chief Complaint  Patient presents with  . Vaginal Bleeding   HPI [name] is a 49 y.o. female who presents for assessment of the following:  Onset: Symptoms present since Tuesday however heavy menstrual bleeding since yesterday.  Context: patient has had irregular cycle and absence of cycle for 4 months prior to this weeks symptoms.  Quality: Described as a bright red blood per vagina with mild bleeding yesterday and 5 pad changes today. These pads were not soaked however they were heavier than normal cycle bleeding.  Location: not per rectum or blood per mouth or nares.  Severity: Symptoms have stabilized and has not had large blood per pad since earlier this PM and are currently without "leaking of blood" per the patient.  Endorses no associated symptoms of HA, AMS, Neck Pain, Fever, Chills, Fatigue, Chest Pain, SOB, Abdominal Pain, Back Pain, or Increased Urinary Frequency, Dysuria, Hematuria. Denies associated Vaginal Discharge.  No Nausea, Emesis, Diarrhea, Blood per Rectum, and no episodes of Focal Weakness, Focal Sensory Loss and patient states has not experienced Abnormal Shaking, or Loss of Consciousness.  Past Medical History  I reviewed & agree with nursing's documentation of PMHx, PSHx, SHx & FHx. Past Medical History  Diagnosis Date  . Other symptoms involving digestive system(787.99)   . Headache(784.0)   . Other specified cardiac dysrhythmias(427.89)   . Abdominal pain, other specified site   . Other constipation   . Anxiety state, unspecified    Past Surgical History  Procedure Laterality Date  . Vaginal delivery  90, 97, 99, 03    x 4   . Hernia repair    . Tubal ligation    . Cervical disc surgery      Ruptured disc repair / and approach c5/6, c6/7  . Cardiac catheterization  12/09/03    Normal   Social History   Social History  . Marital Status: Married    Spouse Name: [name]  . Number of  Children: 4  . Years of Education: 14   Occupational History  . [employer] [place] History Main Topics  . Smoking status: Never Smoker   . Smokeless tobacco: Never Used  . Alcohol Use: No  . Drug Use: No  . Sexual Activity: Yes   Other Topics Concern  . None   Social History Narrative   Patient lives with her husband [name] ) and her children.   Patient has four children.   Patient works full-time.   Patient has a college education.   Patient is right-handed.   Patient drinks caffeine- once or twice a week.   Family History  Problem Relation Age of Onset  . Deep vein thrombosis Mother     crebral blood clot   . Heart failure Father   . Depression Maternal Uncle   . Diabetes Maternal Grandmother   . Stroke Maternal Grandmother   . Cancer Maternal Grandfather     Prostate   . Hypertension Neg Hx     Review of Systems   Complete Review of Systems obtained and is negative except as stated in HPI.  Allergies  Sulfonamide derivatives  Home Medications   Prior to Admission medications   Medication Sig Start Date End Date Taking? Authorizing Provider  ibuprofen (ADVIL,MOTRIN) 200 MG tablet Take 600 mg by mouth every 6 (six) hours as needed for  mild pain or moderate pain.   Yes Historical Provider, MD  Multiple Vitamin (MULTIVITAMIN WITH MINERALS) TABS tablet Take 1 tablet by mouth daily.   Yes Historical Provider, MD  HYDROcodone-acetaminophen (NORCO/VICODIN) 5-325 MG tablet Take 2 tablets by mouth every 4 (four) hours as needed. 12/17/14   Voncille Lo, MD    Physical Exam  Vitals & Nursing notes reviewed. BP 112/68 mmHg  Pulse 69  Temp(Src) 98.9 F (37.2 C) (Oral)  Resp 19  SpO2 100%  LMP 12/15/2014 Physical Exam Vitals & Nursing notes reviewed. CONST: female, in no acute distress. Appears WD/WN & stated age. HEAD: [address]/AT. EYES: PERRL. No conjunctival injection & lids symmetrical. No scleral icterus. ENMT:  External nose & ears atraumatic. MM moist without petechiae or paleness. Oropharynx w/o swelling or exudates. NECK: Supple, w/o meningismus. Trachea midline w/o JVD. Stridor absent. CVS: S1/S2 audible w/o gallops. Murmur absent. Peripheral pulses 2+ & equal in all extremities. Cap refill < 2 seconds. RESP: Respiratory effort normal. Lungs CTAB, w/o wheeze. GI: Soft, w/o TTP. Guarding & rebound absent. BS normal. BACK: W/o CVA TTP bilaterally. SKIN: Warm & dry, w/o rash. W/o open wound. No purpura or petechiae. No jaundice. NEURO: AAOx3. CN II-XIII grossly intact.  Sensation w/o deficit. Strength w/o deficit. Tremor absent. PSYCH:  Cooperative, w/ mood & affect appropriate. MSK: Extremities w/o deformity or TTP.  Joints stable, w/o warmth. W/o cyanosis.  ED Course  Procedures  Labs Review Labs Reviewed  CBC WITH DIFFERENTIAL/PLATELET - Abnormal; Notable for the following:    Hemoglobin 11.8 (*)    All other components within normal limits  BASIC METABOLIC PANEL - Abnormal; Notable for the following:    Glucose, Bld 110 (*)    Calcium 8.4 (*)    All other components within normal limits  I-STAT BETA HCG BLOOD, ED ([facility], [facility], [facility] ONLY)   Imaging Review No results found.  I personally visualized all Labs results, which were used in the medical decision making of this patient's treatment and disposition.  EKG Interpretation  EKG Interpretation  Date/Time:  Thursday December 17 2014 18:17:54 EST Ventricular Rate:  73 PR Interval:  162 QRS Duration: 151 QT Interval:  418 QTC Calculation: 461 R Axis:   3 Text Interpretation:  Sinus rhythm Right bundle branch block No significant change since last tracing Confirmed by LITTLE MD, RACHEL [phone]) on 12/17/2014 6:43:58 PM      MDM  Rito Ehrlich is a 49 y.o. female with H&P as above. ED clinical course as follows:  Urine pregnancy is negative.  Hgb on CBC stable and compared to prior hgb in 2015 not remarkably reduced, 11.8,  reassuring, as are the patient's platelets. On no anticoagulation. No exam finding concerning for coagulopathy.  DDx for vaginal bleeding in non-pregnant patient includes ovulation bleeding, dysfunctional uterine bleeding, fibroids, cervicitis, cancer, bleeding disorder, postpartum hemorrhage and endometriosis.  These considerations were discussed with the patient who states understanding.  No concern for ECG changes of ACS or arrhythmia or electrolyte abnormality or CBG findings that would lead to similar symptoms. No endorsements of CP, SOB or palpitations.   Given degree of anemia patient does not require treatment or blood transfusion at this time. I recommended discussion with OB/GYN regarding iron supplementation and patient will discuss this with their PCP or OB/GYN the following week.   Patient without symptoms or other concerns that would necessciate further workup at this time. Ambulated without orthostasis or pre-syncopal prodromes or syncope. Return precautions discussed and no further  questions or concerns prior to discharge.   Clinical Impression:  1. Vaginal bleeding    Patient care discussed with Dr. Rex Kras, who oversaw their evaluation & treatment & voiced agreement. House Officer: Voncille Lo, MD, Emergency Medicine Resident.  Voncille Lo, MD 12/26/14 [place], MD 12/27/14 [phone]

## 2014-12-17 NOTE — ED Notes (Signed)
Dr. Little at the bedside. 

## 2014-12-30 ENCOUNTER — Encounter: Payer: Self-pay | Admitting: Obstetrics and Gynecology

## 2014-12-30 ENCOUNTER — Ambulatory Visit (INDEPENDENT_AMBULATORY_CARE_PROVIDER_SITE_OTHER): Payer: 59 | Admitting: Obstetrics and Gynecology

## 2014-12-30 VITALS — BP 126/70 | Ht 64.0 in | Wt 221.0 lb

## 2014-12-30 DIAGNOSIS — N939 Abnormal uterine and vaginal bleeding, unspecified: Secondary | ICD-10-CM | POA: Diagnosis not present

## 2014-12-30 DIAGNOSIS — N951 Menopausal and female climacteric states: Secondary | ICD-10-CM

## 2014-12-30 NOTE — Progress Notes (Signed)
Patient ID: [name], female   DOB: 1965/02/24, 49 y.o.   MRN: [id number]   [facility] Visit  Patient name: [name] MRN [id number]  Date of birth: 1965-09-27  CC & HPI:  [name] is a 49 y.o. female presenting today for a follow up for intermittent, vaginal bleeding onset 1 week. Pt was seen in the ER on 12/17/14, and was established to have Hgb of 11.8 and was not dramatically reduced from prior blood work in 2015. She states she started having the vaginal bleeding on the 15th of November and ended on 22nd. She states she has not been having regular periods. PMP was 4 months ago. Her last menstrual periods were May, July and November. She notes associated lower back pain for which she also follows up with neurosurgeon for. Pt reports similar back pain with menstrual cycle/recent vaginal bleeding. She has a h/o embedded uterine fibroids which was established via [place].  ROS:  10 Systems reviewed and all are negative for acute change except as noted in the HPI.   Pertinent History Reviewed:   Reviewed: Significant for  Medical         Past Medical History  Diagnosis Date  . Other symptoms involving digestive system(787.99)   . Headache(784.0)   . Other specified cardiac dysrhythmias(427.89)   . Abdominal pain, other specified site   . Other constipation   . Anxiety state, unspecified                               Surgical Hx:    Past Surgical History  Procedure Laterality Date  . Vaginal delivery  90, 97, 99, 03    x 4   . Hernia repair    . Tubal ligation    . Cervical disc surgery      Ruptured disc repair / and approach c5/6, c6/7  . Cardiac catheterization  12/09/03    Normal   Medications: Reviewed & Updated - see associated section                       Current outpatient prescriptions:  .  ibuprofen (ADVIL,MOTRIN) 200 MG tablet, Take 600 mg by mouth every 6 (six) hours as needed for mild pain or moderate pain., Disp: , Rfl:  .  Multiple  Vitamin (MULTIVITAMIN WITH MINERALS) TABS tablet, Take 1 tablet by mouth daily., Disp: , Rfl:  .  Saccharomyces boulardii (PROBIOTIC) 250 MG CAPS, Take 1 capsule by mouth daily., Disp: , Rfl:    Social History: Reviewed -  reports that she has never smoked. She has never used smokeless tobacco.  Objective Findings:  Vitals: Blood pressure 126/70, height 5\' 4"  (1.626 m), weight 221 lb (100.245 kg), last menstrual period 12/15/2014.  Physical Examination: not done; pt declines today  Discussed with pt her vaginal bleeding and future plan of care. Pt had opportunity to ask questions and has no further questions at this time.   Greater than 50% was spent in counseling and coordination of care with the patient. Discussion time: 20 minutes    Assessment & Plan:   A:  1. Intermittent vaginal bleeding associated with irregular periods    P:  1. Check serum FSH   2.  Discuss Dexascan 3. Exam and consider endometrial biospy and/or [place]   By signing my name below, I, Erling Conte, attest that this documentation has been prepared  under the direction and in the presence of Jonnie Kind, MD. Electronically Signed: Erling Conte, ED Scribe. 12/30/2014. 10:12 AM.  I personally performed the services described in this documentation, which was SCRIBED in my presence. The recorded information has been reviewed and considered accurate. It has been edited as necessary during review. Jonnie Kind, MD

## 2014-12-30 NOTE — Progress Notes (Signed)
Patient ID: [name], female   DOB: 1965/02/28, 49 y.o.   MRN: [id number] Pt here today for follow up from ER. Pt was seen for vaginal bleeding. Pt states that the bleeding stopped on the 22nd of November. Pt denies any cramping or pain since the bleeding stopped.

## 2014-12-31 LAB — FOLLICLE STIMULATING HORMONE: FSH: 11.1 m[IU]/mL

## 2015-01-06 ENCOUNTER — Telehealth: Payer: Self-pay | Admitting: *Deleted

## 2015-01-06 ENCOUNTER — Telehealth: Payer: Self-pay | Admitting: Cardiology

## 2015-01-06 NOTE — Telephone Encounter (Signed)
Pt informed of [name] results, to keep appt with Dr. Glo Herring on Friday to discuss the lower back pain she is having. Pt verbalized understanding.

## 2015-01-08 ENCOUNTER — Ambulatory Visit: Payer: 59 | Admitting: Obstetrics and Gynecology

## 2015-01-11 NOTE — Telephone Encounter (Signed)
Duplicate message. 

## 2015-01-12 ENCOUNTER — Telehealth: Payer: Self-pay | Admitting: *Deleted

## 2015-01-12 NOTE — Telephone Encounter (Signed)
Pt states that she is taking Ibuprofen for severe pain in her lower stomach and lower back. Pt states that she is wanting to see if Dr. Glo Herring can see her. Phone call was switched to front and appointment was given.

## 2015-01-13 ENCOUNTER — Ambulatory Visit (INDEPENDENT_AMBULATORY_CARE_PROVIDER_SITE_OTHER): Payer: 59 | Admitting: Obstetrics and Gynecology

## 2015-01-13 ENCOUNTER — Encounter: Payer: Self-pay | Admitting: Obstetrics and Gynecology

## 2015-01-13 VITALS — BP 110/70 | HR 76 | Ht 65.0 in | Wt 219.0 lb

## 2015-01-13 DIAGNOSIS — N816 Rectocele: Secondary | ICD-10-CM | POA: Diagnosis not present

## 2015-01-13 DIAGNOSIS — N941 Unspecified dyspareunia: Secondary | ICD-10-CM

## 2015-01-13 DIAGNOSIS — N920 Excessive and frequent menstruation with regular cycle: Secondary | ICD-10-CM

## 2015-01-13 NOTE — Progress Notes (Signed)
Patient ID: [name], female   DOB: 01-18-1966, 49 y.o.   MRN: [id number]   [facility] Visit  Patient name: [name] MRN [id number]  Date of birth: 1965-03-02  CC & HPI:  [name] is a 49 y.o. female s/p tubal ligation presenting today for intermittent, moderate menorrhagia that started several months ago. Pt reports lower back and abdominal pain that occurs with menses as an associated symptom. She also notes irregularity in her menstrual cycle; she is currently menstruating and last had menses 15 days ago. Pt was seen in this office on 11/30 for the same. She had [place] measured at that time which indicated that she was pre-menopausal. Pt also reported back pain at that visit, which was being followed by neurology. She has since followed up with neurology who noted no abnormal findings.   Pt also complains of intermittent dyspareunia. Her pain occurs with deep thrust. Pt notes that BM intermittently "become stuck" while trying to defecate.  Denies urinary complaints  ROS:  A complete review of systems was obtained and all systems are negative except as noted in the HPI and PMH.   Pertinent History Reviewed:   Reviewed: Significant for tubal ligation Medical         Past Medical History  Diagnosis Date  . Other symptoms involving digestive system(787.99)   . Headache(784.0)   . Other specified cardiac dysrhythmias(427.89)   . Abdominal pain, other specified site   . Other constipation   . Anxiety state, unspecified                               Surgical Hx:    Past Surgical History  Procedure Laterality Date  . Vaginal delivery  90, 97, 99, 03    x 4   . Hernia repair    . Tubal ligation    . Cervical disc surgery      Ruptured disc repair / and approach c5/6, c6/7  . Cardiac catheterization  12/09/03    Normal   Medications: Reviewed & Updated - see associated section                       Current outpatient prescriptions:  .  ibuprofen  (ADVIL,MOTRIN) 200 MG tablet, Take 600 mg by mouth every 6 (six) hours as needed for mild pain or moderate pain., Disp: , Rfl:  .  Multiple Vitamin (MULTIVITAMIN WITH MINERALS) TABS tablet, Take 1 tablet by mouth daily., Disp: , Rfl:  .  Saccharomyces boulardii (PROBIOTIC) 250 MG CAPS, Take 1 capsule by mouth daily., Disp: , Rfl:    Social History: Reviewed -  reports that she has never smoked. She has never used smokeless tobacco.  Objective Findings:  Vitals: Blood pressure 110/70, pulse 76, height 5\' 5"  (1.651 m), weight 219 lb (99.338 kg), last menstrual period 12/15/2014.  Physical Examination: General appearance - alert, well appearing, and in no distress and oriented to person, place, and time Mental status - alert, oriented to person, place, and time, normal mood, behavior, speech, dress, motor activity, and thought processes Pelvic - normal external genitalia, vulva, vagina, cervix, uterus and adnexa VULVA: normal appearing vulva with no masses, tenderness or lesions VAGINA: normal appearing vagina with normal color and discharge, no lesions CERVIX: normal appearing cervix without discharge or lesions UTERUS: uterus is normal size, shape, consistency and nontender; retroverted; 1st degree descensus  ADNEXA:  normal adnexa in size, nontender and no masses RECTAL: normal sphincter tone; small rectocele  Assessment & Plan:   A:  1. Menorrhagia 2. 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 Small rectocele 4. [place] from 11/30 pre-menopausal  P:  1. Vaginal Hysterectomy and posterior wall repair, potentially scheduled for 02/09/2015 2. Pt to follow up for endometrial biopsy in 1 week under local anesthesia     By signing my name below, I, [name], attest that this documentation has been prepared under the direction and in the presence of Jonnie Kind, MD. Electronically Signed: [name], ED Scribe. 01/13/2015. 9:51 AM.    I personally performed the  services described in this documentation, which was SCRIBED in my presence. The recorded information has been reviewed and considered accurate. It has been edited as necessary during review. Jonnie Kind, MD

## 2015-01-15 DIAGNOSIS — N941 Unspecified dyspareunia: Secondary | ICD-10-CM | POA: Insufficient documentation

## 2015-01-15 DIAGNOSIS — N816 Rectocele: Secondary | ICD-10-CM | POA: Insufficient documentation

## 2015-01-15 DIAGNOSIS — N920 Excessive and frequent menstruation with regular cycle: Secondary | ICD-10-CM | POA: Insufficient documentation

## 2015-01-20 ENCOUNTER — Other Ambulatory Visit: Payer: Self-pay | Admitting: Obstetrics and Gynecology

## 2015-01-20 ENCOUNTER — Ambulatory Visit (INDEPENDENT_AMBULATORY_CARE_PROVIDER_SITE_OTHER): Payer: 59 | Admitting: Obstetrics and Gynecology

## 2015-01-20 ENCOUNTER — Encounter: Payer: Self-pay | Admitting: Obstetrics and Gynecology

## 2015-01-20 VITALS — BP 130/90 | Ht 65.0 in | Wt 218.0 lb

## 2015-01-20 DIAGNOSIS — Z01818 Encounter for other preprocedural examination: Secondary | ICD-10-CM

## 2015-01-20 DIAGNOSIS — N941 Unspecified dyspareunia: Secondary | ICD-10-CM

## 2015-01-20 DIAGNOSIS — N854 Malposition of uterus: Secondary | ICD-10-CM

## 2015-01-20 DIAGNOSIS — N920 Excessive and frequent menstruation with regular cycle: Secondary | ICD-10-CM

## 2015-01-20 DIAGNOSIS — N840 Polyp of corpus uteri: Secondary | ICD-10-CM | POA: Diagnosis not present

## 2015-01-20 DIAGNOSIS — N816 Rectocele: Secondary | ICD-10-CM

## 2015-01-20 NOTE — Progress Notes (Signed)
Patient ID: [name], female   DOB: [birth year], 49 y.o.   MRN: [id number]   Preoperative History and Physical  [name] is a 49 y.o. [id number] here for endometrial biopsy and final decisions re: surgical management of dyspareunia, uterine retroversion, menorrhagia..   No significant preoperative concerns.  Proposed surgery: Laparoscopic Assisted Vaginal Hysterectomy, bilateral salpingoophorectomy, posterior repair.  Past Medical History  Diagnosis Date  . Other symptoms involving digestive system(787.99)   . Headache(784.0)   . Other specified cardiac dysrhythmias(427.89)   . Abdominal pain, other specified site   . Other constipation   . Anxiety state, unspecified    Past Surgical History  Procedure Laterality Date  . Vaginal delivery  90, 97, 99, 03    x 4   . Hernia repair    . Tubal ligation    . Cervical disc surgery      Ruptured disc repair / and approach c5/6, c6/7  . Cardiac catheterization  12/09/03    Normal   OB History  Gravida Para Term Preterm AB SAB TAB Ectopic Multiple Living  6 4 3 1 2  2   4     # Outcome Date GA Lbr Len/2nd Weight Sex Delivery Anes PTL Lv  6 TAB           5 TAB           4 Preterm           3 Term           2 Term           1 Term             Patient denies any other pertinent gynecologic issues.   Current Outpatient Prescriptions on File Prior to Visit  Medication Sig Dispense Refill  . ibuprofen (ADVIL,MOTRIN) 200 MG tablet Take 600 mg by mouth every 6 (six) hours as needed for mild pain or moderate pain.    . Multiple Vitamin (MULTIVITAMIN WITH MINERALS) TABS tablet Take 1 tablet by mouth daily.    . Saccharomyces boulardii (PROBIOTIC) 250 MG CAPS Take 1 capsule by mouth daily.     No current facility-administered medications on file prior to visit.   Allergies  Allergen Reactions  . Sulfonamide Derivatives Rash    Social History:   reports that she has never smoked. She has never used smokeless tobacco. She  reports that she does not drink alcohol or use illicit drugs.  Family History  Problem Relation Age of Onset  . Deep vein thrombosis Mother     crebral blood clot   . Heart failure Father   . Depression Maternal Uncle   . Diabetes Maternal Grandmother   . Stroke Maternal Grandmother   . Cancer Maternal Grandfather     Prostate   . Hypertension Neg Hx     Review of Systems: Noncontributory  PHYSICAL EXAM: Blood pressure 130/90, height 5\' 5"  (1.651 m), weight 218 lb (98.884 kg), last menstrual period 01/09/2015. General appearance - alert, well appearing, and in no distress Chest - clear to auscultation, no wheezes, rales or rhonchi, symmetric air entry Heart - normal rate and regular rhythm Abdomen - soft, nontender, nondistended, no masses or organomegaly Pelvic - efg normal introitus, small rectocele just above anal sphincter,  Vagina normal secretions., cervix, lite d/c, uterus retroverted,  Extremities - peripheral pulses normal, no pedal edema, no clubbing or cyanosis  Labs: No results found for this or any previous visit (from the past  336 hour(s)).  Imaging Studies: No results found.  Assessment: Patient Active Problem List   Diagnosis Date Noted  . Menorrhagia 01/15/2015  . Rectocele 01/15/2015  . Dyspareunia in female, uterine retroversion 01/15/2015  . Sinusitis, acute 04/01/2014  . Cholelithiasis 12/01/2013  . Pelvic pain in female 11/19/2013  . Tick bite 07/27/2013  . Rash and nonspecific skin eruption 07/03/2013  . Urinary frequency 06/28/2013  . Paresthesia 12/05/2012  . Irregular menses 11/28/2012  . Paresthesias 10/08/2012  . 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 Low back pain 09/20/2010  . Back pain 09/15/2010  . GERD (gastroesophageal reflux disease) 07/28/2010  . ADJUSTMENT REACTION WITH PHYSICAL SYMPTOMS 08/10/2009  . SKIN LESION, BENIGN 11/13/2008  . ECZEMA 01/21/2008  . CHRONIC RHINITIS 10/01/2007  . HEADACHE, CHRONIC 10/01/2007  .  HYPERCHOLESTEROLEMIA 03/27/2007  . 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 CONSTIPATION, CHRONIC 03/27/2007  . ANXIETY, MILD 07/16/2006    Plan: Patient will undergo surgical management with Laparoscopic Assisted Vaginal hysterectomy, bilateral salpingoophorectomy, posterior repair..    .mec 01/20/2015 7:32 PM     Pt here for endometrial biopsy under local anesthesia and discussion of potential laparoscopic assisted vaginal hysterectomy with posterior repair. Pt also considering bilateral salpingectomy and oophorectomy. She notes that she rocks and leans to aid with her BMs.   Discussed with pt risks and benefits of laparoscopic assisted vaginal hysterectomy with posterior repair and hormone therapy. At end of discussion, pt had opportunity to ask questions and has no further questions at this time. Greater than 50% was spent in counseling and coordination of care with the patient. Total time greater than: 25 minutes   Procedure note for endometrial biopsy with paracervical block:  Patient given informed consent, signed copy in the chart, time out was performed. Appropriate time out taken. The patient was placed in the lithotomy position and the cervix brought into view with sterile speculum.  Portio of cervix cleansed x 2 with betadine swabs.  A tenaculum was placed in the anterior lip of the cervix. 10 cc paracervical block applied. The uterus was retroverted and sounded for depth of 8 cm. A pipelle was introduced to into the uterus, suction created,  and an endometrial sample was obtained. All equipment was removed and accounted for.  The patient tolerated the procedure well.   Patient given post procedure instructions. The patient will return in 2 weeks for results.    By signing my name below, I, Hansel Feinstein, attest that this documentation has been prepared under the direction and in the presence of Jonnie Kind, MD. Electronically Signed: Hansel Feinstein, ED Scribe. 01/20/2015.  9:26 AM.  I personally performed the services described in this documentation, which was SCRIBED in my presence. The recorded information has been reviewed and considered accurate. It has been edited as necessary during review. Jonnie Kind, MD   I personally performed the services described in this documentation, which was SCRIBED in my presence. The recorded information has been reviewed and considered accurate. It has been edited as necessary during review. Jonnie Kind, MD

## 2015-02-03 ENCOUNTER — Telehealth: Payer: Self-pay | Admitting: Obstetrics and Gynecology

## 2015-02-04 NOTE — Patient Instructions (Signed)
[name]  02/04/2015     @PREFPERIOPPHARMACY @   Your procedure is scheduled on 02/09/2015.  Report to [facility] at 6:15 A.M.  Call this number if you have problems the morning of surgery:  [phone]   Remember:  Do not eat food or drink liquids after midnight.  Take these medicines the morning of surgery with A SIP OF WATER NA   Do not wear jewelry, make-up or nail polish.  Do not wear lotions, powders, or perfumes.  You may wear deodorant.  Do not shave 48 hours prior to surgery.  Men may shave face and neck.  Do not bring valuables to the hospital.  [facility] is not responsible for any belongings or valuables.  Contacts, dentures or bridgework may not be worn into surgery.  Leave your suitcase in the car.  After surgery it may be brought to your room.  For patients admitted to the hospital, discharge time will be determined by your treatment team.  Patients discharged the day of surgery will not be allowed to drive home.    Please read over the following fact sheets that you were given. Surgical Site Infection Prevention and Anesthesia Post-op Instructions                 PATIENT INSTRUCTIONS POST-ANESTHESIA  IMMEDIATELY FOLLOWING SURGERY:  Do not drive or operate machinery for the first twenty four hours after surgery.  Do not make any important decisions for twenty four hours after surgery or while taking narcotic pain medications or sedatives.  If you develop intractable nausea and vomiting or a severe headache please notify your doctor immediately.  FOLLOW-UP:  Please make an appointment with your surgeon as instructed. You do not need to follow up with anesthesia unless specifically instructed to do so.  WOUND CARE INSTRUCTIONS (if applicable):  Keep a dry clean dressing on the anesthesia/puncture wound site if there is drainage.  Once the wound has quit draining you may leave it open to air.  Generally you should leave the bandage intact for twenty four  hours unless there is drainage.  If the epidural site drains for more than 36-48 hours please call the anesthesia department.  QUESTIONS?:  Please feel free to call your physician or the hospital operator if you have any questions, and they will be happy to assist you.    About Rectocele    Overview  A rectocele is a type of hernia which causes different degrees of bulging of the rectal tissues into the vaginal wall.  You may even notice that it presses against the vaginal wall so much that some vaginal tissues droop outside of the opening of your vagina.  Causes of Rectocele  The most common cause is childbirth.  The muscles and ligaments in the pelvis that hold up and support the female organs and vagina become stretched and weakened during labor and delivery.  The more babies you have, the more the support tissues are stretched and weakened.  Not everyone who has a baby will develop a rectocele.  Some women have stronger supporting tissue in the pelvis and may not have as much of a problem as others.  Women who have a Cesarean section usually do not get rectocele's unless they pushed a long time prior to the cesarean delivery.  Other conditions that can cause a rectocele include chronic constipation, a chronic cough, a lot of heavy lifting, and obesity.  Older women may have this problem because the loss  of female hormones causes the vaginal tissue to become weaker.  Symptoms  There may not be any symptoms.  If you do have symptoms, they may include:  Pelvic pressure in the rectal area  Protrusion of the lower part of the vagina through the opening of the vagina  Constipation and trapping of the stool, making it difficult to have a bowel movement.  In severe cases, you may have to press on the lower part of your vagina to help push the stool out of you rectum.  This is called splinting to empty.  Diagnosing Rectocele  Your health care provider will ask about your symptoms and perform  a pelvic exam.  S/he will ask you to bear down, pushing like you are having a bowel movement so as to see how far the lower part of the vagina protrudes into the vagina and possible outside of the vagina.  Your provider will also ask you to contract the muscles of your pelvis (like you are stopping the stream in the middle of urinating) to determine the strength of your pelvic muscles.  Your provider may also do a rectal exam.  Treatment Options  If you do not have any symptoms, no treatment may be necessary.  Other treatment options include:  Pelvic floor exercises: Contracting the muscles in your genital area may help strengthen your muscles and support the organs.  Be sure to get proper exercise instruction from you physical therapist.  A pessary (removealbe pelvic support device) sometimes helps rectocele symptoms.  Surgery: Surgical repair may be necessary. In some cases the uterus may need to be taken out ( a hysterectomy) as well.  There are many types of surgery for pelvic support problems.  Look for physicians who specialize in repair procedures.  You can take care of yourself by:  Treating and preventing constipation  Avoiding heavy lifting, and lifting correctly (with your legs, not with you waist or back)  Treating a chronic cough or bronchitis  Not smoking  avoiding too much weight gain  Doing pelvic floor exercises   2007, [employer] Doc.33

## 2015-02-05 ENCOUNTER — Other Ambulatory Visit: Payer: Self-pay | Admitting: Obstetrics and Gynecology

## 2015-02-05 ENCOUNTER — Encounter (HOSPITAL_COMMUNITY): Payer: Self-pay

## 2015-02-05 ENCOUNTER — Encounter (HOSPITAL_COMMUNITY)
Admission: RE | Admit: 2015-02-05 | Discharge: 2015-02-05 | Disposition: A | Discharge status: Home / Self Care | Payer: 59 | Source: Ambulatory Visit | Attending: Obstetrics and Gynecology | Admitting: Obstetrics and Gynecology

## 2015-02-05 DIAGNOSIS — N920 Excessive and frequent menstruation with regular cycle: Secondary | ICD-10-CM | POA: Insufficient documentation

## 2015-02-05 DIAGNOSIS — Z01812 Encounter for preprocedural laboratory examination: Secondary | ICD-10-CM | POA: Diagnosis not present

## 2015-02-05 DIAGNOSIS — Z0183 Encounter for blood typing: Secondary | ICD-10-CM | POA: Insufficient documentation

## 2015-02-05 HISTORY — DX: Gastro-esophageal reflux disease without esophagitis: K21.9

## 2015-02-05 HISTORY — DX: Anemia, unspecified: D64.9

## 2015-02-05 LAB — COMPREHENSIVE METABOLIC PANEL
ALBUMIN: 3.7 g/dL (ref 3.5–5.0)
ALK PHOS: 70 U/L (ref 38–126)
ALT: 25 U/L (ref 14–54)
ANION GAP: 5 (ref 5–15)
AST: 23 U/L (ref 15–41)
BILIRUBIN TOTAL: 0.6 mg/dL (ref 0.3–1.2)
BUN: 10 mg/dL (ref 6–20)
CALCIUM: 9 mg/dL (ref 8.9–10.3)
CO2: 26 mmol/L (ref 22–32)
CREATININE: 0.76 mg/dL (ref 0.44–1.00)
Chloride: 108 mmol/L (ref 101–111)
GFR calc non Af Amer: >60 mL/min (ref >60)
GLUCOSE: 85 mg/dL (ref 65–99)
Potassium: 4.2 mmol/L (ref 3.5–5.1)
Sodium: 139 mmol/L (ref 135–145)
TOTAL PROTEIN: 6.7 g/dL (ref 6.5–8.1)

## 2015-02-05 LAB — HCG, SERUM, QUALITATIVE: PREG SERUM: NEGATIVE

## 2015-02-05 LAB — URINE MICROSCOPIC-ADD ON: WBC, UA: NONE SEEN WBC/hpf (ref 0–5)

## 2015-02-05 LAB — CBC
HEMATOCRIT: 34.7 % — AB (ref 36.0–46.0)
HEMOGLOBIN: 11.3 g/dL — AB (ref 12.0–15.0)
MCH: 28.8 pg (ref 26.0–34.0)
MCHC: 32.6 g/dL (ref 30.0–36.0)
MCV: 88.5 fL (ref 78.0–100.0)
Platelets: 437 10*3/uL — ABNORMAL HIGH (ref 150–400)
RBC: 3.92 MIL/uL (ref 3.87–5.11)
RDW: 14.1 % (ref 11.5–15.5)
WBC: 6.3 10*3/uL (ref 4.0–10.5)

## 2015-02-05 LAB — URINALYSIS, ROUTINE W REFLEX MICROSCOPIC
Bilirubin Urine: NEGATIVE
GLUCOSE, UA: NEGATIVE mg/dL
Ketones, ur: NEGATIVE mg/dL
Leukocytes, UA: NEGATIVE
Nitrite: NEGATIVE
PROTEIN: NEGATIVE mg/dL
Specific Gravity, Urine: 1.025 (ref 1.005–1.030)
pH: 5.5 (ref 5.0–8.0)

## 2015-02-07 LAB — TYPE AND SCREEN
ABO/RH(D): AB NEG
Antibody Screen: NEGATIVE

## 2015-02-07 NOTE — Telephone Encounter (Signed)
Unable to reach pt by phone, and mailbox is full. Plans are to proceed with surgery on Tuesday. I am glad she has begun the menses. I prefer to do surgery shortly after menses.

## 2015-02-08 NOTE — Telephone Encounter (Signed)
Left message x 1. JSY 

## 2015-02-08 NOTE — Telephone Encounter (Signed)
Spoke with pt letting her know plans are to proceed with surgery on Tuesday. Pt voiced understanding. [place]

## 2015-02-08 NOTE — H&P (Signed)
Patient ID: [name], female DOB: 25-Sep-1965, 50 y.o. MRN: [id number]   Preoperative History and Physical  [name] is a 50 y.o. [id number] here for endometrial biopsy and final decisions re: surgical management of dyspareunia, uterine retroversion, menorrhagia.. No significant preoperative concerns. [name] is a 50 y.o. female s/p tubal ligation presenting  for intermittent, moderate menorrhagia that started several months ago. Pt reports lower back and abdominal pain that occurs with menses as an associated symptom. She also notes irregularity in her menstrual cycle; she is currently menstruating and last had menses 15 days ago. Pt was seen in this office on 11/30 for the same. She had [place] measured at that time which indicated that she was pre-menopausal. Pt also reported back pain at that visit, which was being followed by neurology. She has since followed up with neurology who noted no abnormal findings.   Pt also complains of intermittent dyspareunia. Her pain occurs with deep thrust. Pt notes that BM intermittently "become stuck" while trying to defecate. Denies urinary complaints Proposed surgery: Laparoscopic Assisted Vaginal Hysterectomy, bilateral salpingoophorectomy, posterior repair.  Past Medical History  Diagnosis Date  . Other symptoms involving digestive system(787.99)   . Headache(784.0)   . Other specified cardiac dysrhythmias(427.89)   . Abdominal pain, other specified site   . Other constipation   . Anxiety state, unspecified    Past Surgical History  Procedure Laterality Date  . Vaginal delivery  90, 97, 99, 03    x 4   . Hernia repair    . Tubal ligation    . Cervical disc surgery      Ruptured disc repair / and approach c5/6, c6/7  . Cardiac catheterization  12/09/03    Normal   OB History  Gravida Para Term Preterm AB SAB TAB Ectopic Multiple Living  6 4 3 1 2  2    4     # Outcome Date GA Lbr Len/2nd Weight Sex Delivery Anes PTL Lv  6 TAB           5 TAB           4 Preterm           3 Term           2 Term           1 Term             Patient denies any other pertinent gynecologic issues.   Current Outpatient Prescriptions on File Prior to Visit  Medication Sig Dispense Refill  . ibuprofen (ADVIL,MOTRIN) 200 MG tablet Take 600 mg by mouth every 6 (six) hours as needed for mild pain or moderate pain.    . Multiple Vitamin (MULTIVITAMIN WITH MINERALS) TABS tablet Take 1 tablet by mouth daily.    . Saccharomyces boulardii (PROBIOTIC) 250 MG CAPS Take 1 capsule by mouth daily.     No current facility-administered medications on file prior to visit.   Allergies  Allergen Reactions  . Sulfonamide Derivatives Rash    Social History:  reports that she has never smoked. She has never used smokeless tobacco. She reports that she does not drink alcohol or use illicit drugs.  Family History  Problem Relation Age of Onset  . Deep vein thrombosis Mother     crebral blood clot   . Heart failure Father   . Depression Maternal Uncle   . Diabetes Maternal Grandmother   . Stroke Maternal Grandmother   . Cancer Maternal  Grandfather     Prostate   . Hypertension Neg Hx     Review of Systems: Noncontributory  PHYSICAL EXAM: Blood pressure 130/90, height 5\' 5"  (1.651 m), weight 218 lb (98.884 kg), last menstrual period 01/09/2015. General appearance - alert, well appearing, and in no distress Chest - clear to auscultation, no wheezes, rales or rhonchi, symmetric air entry Heart - normal rate and regular rhythm Abdomen - soft, nontender, nondistended, no masses or organomegaly Pelvic - efg normal introitus, small rectocele just above anal sphincter, Vagina normal secretions., cervix, lite d/c,  uterus retroverted,  Extremities - peripheral pulses normal, no pedal edema, no clubbing or cyanosis  Labs: No results found for this or any previous visit (from the past 336 hour(s)).  Imaging Studies:  Imaging Results    No results found.    Assessment: Patient Active Problem List   Diagnosis Date Noted  . Menorrhagia 01/15/2015  . Rectocele 01/15/2015  . Dyspareunia in female, uterine retroversion 01/15/2015  . Sinusitis, acute 04/01/2014  . Cholelithiasis 12/01/2013  . Pelvic pain in female 11/19/2013  . Tick bite 07/27/2013  . Rash and nonspecific skin eruption 07/03/2013  . Urinary frequency 06/28/2013  . Paresthesia 12/05/2012  . Irregular menses 11/28/2012  . Paresthesias 10/08/2012  . 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 Low back pain 09/20/2010  . Back pain 09/15/2010  . GERD (gastroesophageal reflux disease) 07/28/2010  . ADJUSTMENT REACTION WITH PHYSICAL SYMPTOMS 08/10/2009  . SKIN LESION, BENIGN 11/13/2008  . ECZEMA 01/21/2008  . CHRONIC RHINITIS 10/01/2007  . HEADACHE, CHRONIC 10/01/2007  . HYPERCHOLESTEROLEMIA 03/27/2007  . 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 CONSTIPATION, CHRONIC 03/27/2007  . ANXIETY, MILD 07/16/2006    Plan: Patient will undergo surgical management with Laparoscopic Assisted Vaginal hysterectomy, bilateral salpingoophorectomy, posterior repair..    .mec 01/20/2015 7:32 PM     Pt here for endometrial biopsy under local anesthesia and discussion of potential laparoscopic assisted vaginal hysterectomy with posterior repair. Pt also considering bilateral salpingectomy and oophorectomy. She notes that she rocks and leans to aid with her BMs.   Discussed with pt risks and benefits of laparoscopic assisted vaginal hysterectomy with posterior repair and hormone therapy. At end of discussion, pt had opportunity to ask questions and has no further questions at  this time. Greater than 50% was spent in counseling and coordination of care with the patient. Total time greater than: 25 minutes   Procedure note for endometrial biopsy with paracervical block:  Patient given informed consent, signed copy in the chart, time out was performed. Appropriate time out taken. The patient was placed in the lithotomy position and the cervix brought into view with sterile speculum. Portio of cervix cleansed x 2 with betadine swabs. A tenaculum was placed in the anterior lip of the cervix. 10 cc paracervical block applied. The uterus was retroverted and sounded for depth of 8 cm. A pipelle was introduced to into the uterus, suction created, and an endometrial sample was obtained. All equipment was removed and accounted for. The patient tolerated the procedure well.   Patient given post procedure instructions. The patient will return in 2 weeks for results.    By signing my name below, I, Hansel Feinstein, attest that this documentation has been prepared under the direction and in the presence of Jonnie Kind, MD. Electronically Signed: Hansel Feinstein, ED Scribe. 01/20/2015. 9:26 AM.  I personally performed the services described in this documentation, which was SCRIBED in my presence. The recorded information has been reviewed and considered  accurate. It has been edited as necessary during review. Jonnie Kind, MD  I personally performed the services described in this documentation, which was SCRIBED in my presence. The recorded information has been reviewed and considered accurate. It has been edited as necessary during review. Jonnie Kind, MD

## 2015-02-09 ENCOUNTER — Telehealth: Payer: Self-pay | Admitting: Obstetrics and Gynecology

## 2015-02-09 NOTE — Telephone Encounter (Signed)
Patient called to inform her of my URI, and fever, and that I must cancel her case today. Pt accepts the apologies. We will contact pt tomorrow with revised schedule.

## 2015-02-22 ENCOUNTER — Encounter: Payer: 59 | Admitting: Obstetrics and Gynecology

## 2015-03-02 ENCOUNTER — Telehealth: Payer: Self-pay | Admitting: Obstetrics and Gynecology

## 2015-03-02 NOTE — Telephone Encounter (Signed)
Pt states that she is supposed to start her period on the 22nd of this month but she has not started yet. Pt states that she was told he wanted to do her surgery after she finished her period.  I advised the pt that I would need to discuss this with Dr. Glo Herring and give her a call back. Pt verbalized understanding.

## 2015-03-04 NOTE — Patient Instructions (Signed)
[name]  03/04/2015     @PREFPERIOPPHARMACY @   Your procedure is scheduled on 03/09/15  Report to [facility] at [email or web address].  Call this number if you have problems the morning of surgery:  [phone]   Remember:  Do not eat food or drink liquids after midnight.  Take these medicines the morning of surgery with A SIP OF WATER: NONE   Do not wear jewelry, make-up or nail polish.  Do not wear lotions, powders, or perfumes.  You may wear deodorant.  Do not shave 48 hours prior to surgery.  Men may shave face and neck.  Do not bring valuables to the hospital.  [facility] is not responsible for any belongings or valuables.  Contacts, dentures or bridgework may not be worn into surgery.  Leave your suitcase in the car.  After surgery it may be brought to your room.  For patients admitted to the hospital, discharge time will be determined by your treatment team.  Patients discharged the day of surgery will not be allowed to drive home.   Name and phone number of your driver:  FAMILY Special instructions:    Please read over the following fact sheets that you were given. Anesthesia Post-op Instructions and Care and Recovery After Surgery  PATIENT INSTRUCTIONS POST-ANESTHESIA  IMMEDIATELY FOLLOWING SURGERY:  Do not drive or operate machinery for the first twenty four hours after surgery.  Do not make any important decisions for twenty four hours after surgery or while taking narcotic pain medications or sedatives.  If you develop intractable nausea and vomiting or a severe headache please notify your doctor immediately.  FOLLOW-UP:  Please make an appointment with your surgeon as instructed. You do not need to follow up with anesthesia unless specifically instructed to do so.  WOUND CARE INSTRUCTIONS (if applicable):  Keep a dry clean dressing on the anesthesia/puncture wound site if there is drainage.  Once the wound has quit draining you may leave it open to air.  Generally you  should leave the bandage intact for twenty four hours unless there is drainage.  If the epidural site drains for more than 36-48 hours please call the anesthesia department.  QUESTIONS?:  Please feel free to call your physician or the hospital operator if you have any questions, and they will be happy to assist you.      Bilateral Salpingo-Oophorectomy Bilateral salpingo-oophorectomy is the surgical removal of both fallopian tubes and both ovaries. The ovaries are small organs that produce eggs in women. The fallopian tubes transport the egg from the ovary to the womb (uterus). Usually, when this surgery is done, the uterus was previously removed. A bilateral salpingo-oophorectomy may be done to treat cancer or to reduce the risk of cancer in women who are at high risk. Removing both fallopian tubes and both ovaries will make you unable to become pregnant (sterile). It will also put you into menopause so that you will no longer have menstrual periods and may have menopausal symptoms such as hot flashes, night sweats, and mood changes. It will not affect your sex drive. LET [facility] CARE PROVIDER KNOW ABOUT:  Any allergies you have.  All medicines you are taking, including vitamins, herbs, eye drops, creams, and over-the-counter medicines.  Previous problems you or members of your family have had with the use of anesthetics.  Any blood disorders you have.  Previous surgeries you have had.  Medical conditions you have. RISKS AND COMPLICATIONS Generally, this is a  safe procedure. However, as with any procedure, complications can occur. Possible complications include:  Injury to surrounding organs.  Bleeding.  Infection.  Blood clots in the legs or lungs.  Problems related to anesthesia. BEFORE THE PROCEDURE  Ask your health care provider about changing or stopping your regular medicines. You may need to stop taking certain medicines, such as aspirin or blood thinners, at least 1  week before the surgery.  Do not eat or drink anything for at least 8 hours before the surgery.  If you smoke, do not smoke for at least 2 weeks before the surgery.  Make plans to have someone drive you home after the procedure or after your hospital stay. Also arrange for someone to help you with activities during recovery. PROCEDURE   You will be given medicine to help you relax before the procedure (sedative). You will then be given medicine to make you sleep through the procedure (general anesthetic). These medicines will be given through an IV access tube that is put into one of your veins.  Once you are asleep, your lower abdomen will be shaved and cleaned. A thin, flexible tube (catheter) will be placed in your bladder.  The surgeon may use a laparoscopic, robotic, or open technique for this surgery:  In the laparoscopic technique, the surgery is done through two small cuts (incisions) in the abdomen. A thin, lighted tube with a tiny camera on the end (laparoscope) is inserted into one of the incisions. The tools needed for the procedure are put through the other incision.  A robotic technique may be chosen to perform complex surgery in a small space. In the robotic technique, small incisions will be made. A camera and surgical instruments are passed through the incisions. Surgical instruments will be controlled with the help of a robotic arm.  In the open technique, the surgery is done through one large incision in the abdomen.  Using any of these techniques, the surgeon removes the fallopian tubes and ovaries. The blood vessels will be clamped and tied.  The surgeon then uses staples or stitches to close the incision or incisions. AFTER THE PROCEDURE  You will be taken to a recovery area where you will be monitored for 1 to 3 hours. Your blood pressure, pulse, and temperature will be checked often. You will remain in the recovery area until you are stable and waking up.  If the  laparoscopic technique was used, you may be allowed to go home after several hours. You may have some shoulder pain after the laparoscopic procedure. This is normal and usually goes away in a day or two.  If the open technique was used, you will be admitted to the hospital for a couple of days.  You will be given pain medicine as needed.  The IV access tube and catheter will be removed before you are discharged.   This information is not intended to replace advice given to you by your health care provider. Make sure you discuss any questions you have with your health care provider.   Document Released: 01/16/2005 Document Revised: 01/21/2013 Document Reviewed: 07/10/2012 Elsevier Interactive Patient Education 2016 [place]. Laparoscopically Assisted Vaginal Hysterectomy A laparoscopically assisted vaginal hysterectomy (LAVH) is a surgical procedure to remove the uterus and cervix, and sometimes the ovaries and fallopian tubes. During an LAVH, some of the surgical removal is done through the vagina, and the rest is done through a few small surgical cuts (incisions) in the abdomen.  This procedure is  usually considered in women when a vaginal hysterectomy is not an option. Your health care provider will discuss the risks and benefits of the different surgical techniques at your appointment. Generally, recovery time is faster and there are fewer complications after laparoscopic procedures than after open incisional procedures. LET [facility] CARE PROVIDER KNOW ABOUT:   Any allergies you have.  All medicines you are taking, including vitamins, herbs, eye drops, creams, and over-the-counter medicines.  Previous problems you or members of your family have had with the use of anesthetics.  Any blood disorders you have.  Previous surgeries you have had.  Medical conditions you have. RISKS AND COMPLICATIONS Generally, this is a safe procedure. However, as with any procedure, complications  can occur. Possible complications include:  Allergies to medicines.  Difficulty breathing.  Bleeding.  Infection.  Damage to other structures near your uterus and cervix. BEFORE THE PROCEDURE  Ask your health care provider about changing or stopping your regular medicines.  Take certain medicines, such as a colon-emptying preparation, as directed.  Do not eat or drink anything for at least 8 hours before your surgery.  Stop smoking if you smoke. Stopping will improve your health after surgery.  Arrange for a ride home after surgery and for help at home during recovery. PROCEDURE   An IV tube will be put into one of your veins in order to give you fluids and medicines.  You will receive medicines to relax you and medicines that make you sleep (general anesthetic).  You may have a flexible tube (catheter) put into your bladder to drain urine.  You may have a tube put through your nose or mouth that goes into your stomach (nasogastric tube). The nasogastric tube removes digestive fluids and prevents you from feeling nauseated and from vomiting.  Tight-fitting (compression) stockings will be placed on your legs to promote circulation.  Three to four small incisions will be made in your abdomen. An incision also will be made in your vagina. Probes and tools will be inserted into the small incisions. The uterus and cervix are removed (and possibly your ovaries and fallopian tubes) through your vagina as well as through the small incisions that were made in the abdomen.  Your vagina is then sewn back to normal. AFTER THE PROCEDURE  You may have a liquid diet temporarily. You will most likely return to, and tolerate, your usual diet the day after surgery.  You will be passing urine through a catheter. It will be removed the day after surgery.  Your temperature, breathing rate, heart rate, blood pressure, and oxygen level will be monitored regularly.  You will still wear  compression stockings on your legs until you are able to move around.  You will use a special device or do breathing exercises to keep your lungs clear.  You will be encouraged to walk as soon as possible.   This information is not intended to replace advice given to you by your health care provider. Make sure you discuss any questions you have with your health care provider.   Document Released: 01/05/2011 Document Revised: 02/06/2014 Document Reviewed: 08/01/2012 Elsevier Interactive Patient Education [employer].

## 2015-03-05 ENCOUNTER — Encounter: Payer: Self-pay | Admitting: Obstetrics and Gynecology

## 2015-03-05 ENCOUNTER — Other Ambulatory Visit: Payer: Self-pay | Admitting: Obstetrics and Gynecology

## 2015-03-05 ENCOUNTER — Encounter (HOSPITAL_COMMUNITY)
Admission: RE | Admit: 2015-03-05 | Discharge: 2015-03-05 | Disposition: A | Discharge status: Home / Self Care | Payer: 59 | Source: Ambulatory Visit | Attending: Obstetrics and Gynecology | Admitting: Obstetrics and Gynecology

## 2015-03-05 ENCOUNTER — Ambulatory Visit (INDEPENDENT_AMBULATORY_CARE_PROVIDER_SITE_OTHER): Payer: 59 | Admitting: Obstetrics and Gynecology

## 2015-03-05 ENCOUNTER — Encounter (HOSPITAL_COMMUNITY): Payer: Self-pay

## 2015-03-05 VITALS — BP 130/80 | Ht 64.5 in | Wt 219.0 lb

## 2015-03-05 DIAGNOSIS — N941 Unspecified dyspareunia: Secondary | ICD-10-CM

## 2015-03-05 DIAGNOSIS — N816 Rectocele: Secondary | ICD-10-CM

## 2015-03-05 DIAGNOSIS — Z029 Encounter for administrative examinations, unspecified: Secondary | ICD-10-CM

## 2015-03-05 DIAGNOSIS — Z01812 Encounter for preprocedural laboratory examination: Secondary | ICD-10-CM | POA: Insufficient documentation

## 2015-03-05 DIAGNOSIS — N920 Excessive and frequent menstruation with regular cycle: Secondary | ICD-10-CM | POA: Diagnosis not present

## 2015-03-05 LAB — CBC
HCT: 36.1 % (ref 36.0–46.0)
Hemoglobin: 11.7 g/dL — ABNORMAL LOW (ref 12.0–15.0)
MCH: 28.3 pg (ref 26.0–34.0)
MCHC: 32.4 g/dL (ref 30.0–36.0)
MCV: 87.4 fL (ref 78.0–100.0)
PLATELETS: 368 10*3/uL (ref 150–400)
RBC: 4.13 MIL/uL (ref 3.87–5.11)
RDW: 14.3 % (ref 11.5–15.5)
WBC: 7 10*3/uL (ref 4.0–10.5)

## 2015-03-05 LAB — URINALYSIS, ROUTINE W REFLEX MICROSCOPIC
BILIRUBIN URINE: NEGATIVE
Glucose, UA: NEGATIVE mg/dL
Ketones, ur: NEGATIVE mg/dL
Leukocytes, UA: NEGATIVE
Nitrite: NEGATIVE
PH: 5.5 (ref 5.0–8.0)
Protein, ur: NEGATIVE mg/dL

## 2015-03-05 LAB — TYPE AND SCREEN
ABO/RH(D): AB NEG
ANTIBODY SCREEN: NEGATIVE

## 2015-03-05 LAB — COMPREHENSIVE METABOLIC PANEL
ALBUMIN: 3.8 g/dL (ref 3.5–5.0)
ALT: 19 U/L (ref 14–54)
AST: 18 U/L (ref 15–41)
Alkaline Phosphatase: 73 U/L (ref 38–126)
Anion gap: 6 (ref 5–15)
BUN: 16 mg/dL (ref 6–20)
CHLORIDE: 108 mmol/L (ref 101–111)
CO2: 26 mmol/L (ref 22–32)
CREATININE: 0.93 mg/dL (ref 0.44–1.00)
Calcium: 9 mg/dL (ref 8.9–10.3)
GFR calc Af Amer: >60 mL/min (ref >60)
GFR calc non Af Amer: >60 mL/min (ref >60)
Glucose, Bld: 95 mg/dL (ref 65–99)
POTASSIUM: 3.8 mmol/L (ref 3.5–5.1)
SODIUM: 140 mmol/L (ref 135–145)
Total Bilirubin: 0.6 mg/dL (ref 0.3–1.2)
Total Protein: 7 g/dL (ref 6.5–8.1)

## 2015-03-05 LAB — URINE MICROSCOPIC-ADD ON: WBC, UA: NONE SEEN WBC/hpf (ref 0–5)

## 2015-03-05 LAB — HCG, SERUM, QUALITATIVE: PREG SERUM: NEGATIVE

## 2015-03-05 NOTE — Telephone Encounter (Signed)
Pt seen today 2/3/ 17 as preop. Pt has recovered from recent uri.   Will proceed with surgery next week at 7:30 tuesday

## 2015-03-05 NOTE — Progress Notes (Signed)
Patient ID: [name], female   DOB: 01-14-1966, 50 y.o.   MRN: [id number]  Preoperative History and Physical  [name] is a 50 y.o. [id number] here for surgical management of dyspareunia, uterine retroversion, menorrhagia. No significant preoperative concerns. She complains of vaginal dryness that is exacerbated with intercourse. She has not tried any medications or lubricants. Pt also notes she recently got over a cold. She denies fever, chills.    Proposed surgery: Laparoscopic Assisted Vaginal Hysterectomy, bilateral salpingoophorectomy, posterior repair.  Past Medical History  Diagnosis Date  . Other symptoms involving digestive system(787.99)   . Headache(784.0)   . Abdominal pain, other specified site   . Other constipation   . Anxiety state, unspecified   . GERD (gastroesophageal reflux disease)   . Other specified cardiac dysrhythmias(427.89)     RBBB  . Anemia    Past Surgical History  Procedure Laterality Date  . Vaginal delivery  90, 97, 99, 03    x 4   . Tubal ligation    . Cervical disc surgery      Ruptured disc repair / and approach c5/6, c6/7  . Cardiac catheterization  12/09/03    Normal  . Hernia repair      umbilical   OB History  Gravida Para Term Preterm AB SAB TAB Ectopic Multiple Living  6 4 3 1 2  2   4     # Outcome Date GA Lbr Len/2nd Weight Sex Delivery Anes PTL Lv  6 TAB           5 TAB           4 Preterm           3 Term           2 Term           1 Term             Patient denies any other pertinent gynecologic issues.   Current Outpatient Prescriptions on File Prior to Visit  Medication Sig Dispense Refill  . ibuprofen (ADVIL,MOTRIN) 200 MG tablet Take 600 mg by mouth every 6 (six) hours as needed for mild pain or moderate pain.    . Multiple Vitamin (MULTIVITAMIN WITH MINERALS) TABS tablet Take 1 tablet by mouth daily.    . Saccharomyces boulardii (PROBIOTIC) 250 MG CAPS Take 1 capsule by mouth daily.     No current  facility-administered medications on file prior to visit.   Allergies  Allergen Reactions  . Sulfonamide Derivatives Rash    Social History:   reports that she has never smoked. She has never used smokeless tobacco. She reports that she does not drink alcohol or use illicit drugs.  Family History  Problem Relation Age of Onset  . Deep vein thrombosis Mother     crebral blood clot   . Heart failure Father   . Depression Maternal Uncle   . Diabetes Maternal Grandmother   . Stroke Maternal Grandmother   . Cancer Maternal Grandfather     Prostate   . Hypertension Neg Hx     Review of Systems: Noncontributory  PHYSICAL EXAM: Blood pressure 130/80, height 5' 4.5" (1.638 m), weight 219 lb (99.338 kg), last menstrual period 01/04/2015. General appearance - alert, well appearing, and in no distress Chest - clear to auscultation, no wheezes, rales or rhonchi, symmetric air entry Heart - normal rate and regular rhythm Abdomen - soft, nontender, nondistended, no masses or organomegaly Pelvic: Normal external female  genitalia. Vagina is pink and rugated.  Normal discharge. Normal cervix contour. Uterus is normal in size. No adnexal mass or tenderness.  Rectal: good sphincter tone; bottom 3 cm of anus support is good; rectocele defect above this area Extremities - peripheral pulses normal, no pedal edema, no clubbing or cyanosis  Labs: No results found for this or any previous visit (from the past 336 hour(s)).  Imaging Studies: No results found.  Assessment: Patient Active Problem List   Diagnosis Date Noted  . Menorrhagia 01/15/2015  . Rectocele 01/15/2015  . Dyspareunia in female, uterine retroversion 01/15/2015  . Sinusitis, acute 04/01/2014  . Cholelithiasis 12/01/2013  . Pelvic pain in female 11/19/2013  . Tick bite 07/27/2013  . Rash and nonspecific skin eruption 07/03/2013  . Urinary frequency 06/28/2013  . Paresthesia 12/05/2012  . Irregular menses 11/28/2012  .  Paresthesias 10/08/2012  . 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 Low back pain 09/20/2010  . Back pain 09/15/2010  . GERD (gastroesophageal reflux disease) 07/28/2010  . ADJUSTMENT REACTION WITH PHYSICAL SYMPTOMS 08/10/2009  . SKIN LESION, BENIGN 11/13/2008  . ECZEMA 01/21/2008  . CHRONIC RHINITIS 10/01/2007  . HEADACHE, CHRONIC 10/01/2007  . HYPERCHOLESTEROLEMIA 03/27/2007  . 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 CONSTIPATION, CHRONIC 03/27/2007  . ANXIETY, MILD 07/16/2006    Plan: Patient will undergo surgical management with LAVH with BSO, posterior repair on 03/09/15 at 7:30 AM Follow up in 2 weeks for post op visit   Will provide lubricant information.     .mec 03/05/2015 10:30 AM    By signing my name below, I, Hansel Feinstein, attest that this documentation has been prepared under the direction and in the presence of Jonnie Kind, MD. Electronically Signed: Hansel Feinstein, ED Scribe. 03/05/2015. 10:30 AM.  I personally performed the services described in this documentation, which was SCRIBED in my presence. The recorded information has been reviewed and considered accurate. It has been edited as necessary during review. Jonnie Kind, MD

## 2015-03-05 NOTE — H&P (Signed)
Jonnie Kind, MD at 03/05/2015 10:30 AM     Status: Signed       Expand All Collapse All   Patient ID: [name], female DOB: 12/22/1965, 50 y.o. MRN: [id number]  Preoperative History and Physical  [name] is a 50 y.o. [id number] here for surgical management of dyspareunia, uterine retroversion, menorrhagia. No significant preoperative concerns. She complains of vaginal dryness that is exacerbated with intercourse. She has not tried any medications or lubricants. Pt also notes she recently got over a cold. She denies fever, chills.    Proposed surgery: Laparoscopic Assisted Vaginal Hysterectomy, bilateral salpingoophorectomy, posterior repair.  Past Medical History  Diagnosis Date  . Other symptoms involving digestive system(787.99)   . Headache(784.0)   . Abdominal pain, other specified site   . Other constipation   . Anxiety state, unspecified   . GERD (gastroesophageal reflux disease)   . Other specified cardiac dysrhythmias(427.89)     RBBB  . Anemia    Past Surgical History  Procedure Laterality Date  . Vaginal delivery  90, 97, 99, 03    x 4   . Tubal ligation    . Cervical disc surgery      Ruptured disc repair / and approach c5/6, c6/7  . Cardiac catheterization  12/09/03    Normal  . Hernia repair      umbilical   OB History  Gravida Para Term Preterm AB SAB TAB Ectopic Multiple Living  6 4 3 1 2  2   4     # Outcome Date GA Lbr Len/2nd Weight Sex Delivery Anes PTL Lv  6 TAB           5 TAB           4 Preterm           3 Term           2 Term           1 Term             Patient denies any other pertinent gynecologic issues.   Current Outpatient Prescriptions on File Prior to Visit  Medication Sig Dispense Refill  . ibuprofen (ADVIL,MOTRIN) 200 MG tablet Take 600  mg by mouth every 6 (six) hours as needed for mild pain or moderate pain.    . Multiple Vitamin (MULTIVITAMIN WITH MINERALS) TABS tablet Take 1 tablet by mouth daily.    . Saccharomyces boulardii (PROBIOTIC) 250 MG CAPS Take 1 capsule by mouth daily.     No current facility-administered medications on file prior to visit.   Allergies  Allergen Reactions  . Sulfonamide Derivatives Rash    Social History:  reports that she has never smoked. She has never used smokeless tobacco. She reports that she does not drink alcohol or use illicit drugs.  Family History  Problem Relation Age of Onset  . Deep vein thrombosis Mother     crebral blood clot   . Heart failure Father   . Depression Maternal Uncle   . Diabetes Maternal Grandmother   . Stroke Maternal Grandmother   . Cancer Maternal Grandfather     Prostate   . Hypertension Neg Hx     Review of Systems: Noncontributory  PHYSICAL EXAM: Blood pressure 130/80, height 5' 4.5" (1.638 m), weight 219 lb (99.338 kg), last menstrual period 01/04/2015. General appearance - alert, well appearing, and in no distress Chest - clear to auscultation, no wheezes, rales or rhonchi, symmetric air  entry Heart - normal rate and regular rhythm Abdomen - soft, nontender, nondistended, no masses or organomegaly Pelvic: Normal external female genitalia. Vagina is pink and rugated. Normal discharge. Normal cervix contour. Uterus is normal in size. No adnexal mass or tenderness.  Rectal: good sphincter tone; bottom 3 cm of anus support is good; rectocele defect above this area Extremities - peripheral pulses normal, no pedal edema, no clubbing or cyanosis  Labs: No results found for this or any previous visit (from the past 336 hour(s)).  Imaging Studies:  Imaging Results    No results found.    Assessment: Patient Active Problem List   Diagnosis Date Noted  . Menorrhagia  01/15/2015  . Rectocele 01/15/2015  . Dyspareunia in female, uterine retroversion 01/15/2015  . Sinusitis, acute 04/01/2014  . Cholelithiasis 12/01/2013  . Pelvic pain in female 11/19/2013  . Tick bite 07/27/2013  . Rash and nonspecific skin eruption 07/03/2013  . Urinary frequency 06/28/2013  . Paresthesia 12/05/2012  . Irregular menses 11/28/2012  . Paresthesias 10/08/2012  . 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 Low back pain 09/20/2010  . Back pain 09/15/2010  . GERD (gastroesophageal reflux disease) 07/28/2010  . ADJUSTMENT REACTION WITH PHYSICAL SYMPTOMS 08/10/2009  . SKIN LESION, BENIGN 11/13/2008  . ECZEMA 01/21/2008  . CHRONIC RHINITIS 10/01/2007  . HEADACHE, CHRONIC 10/01/2007  . HYPERCHOLESTEROLEMIA 03/27/2007  . 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 CONSTIPATION, CHRONIC 03/27/2007  . ANXIETY, MILD 07/16/2006    Plan: Patient will undergo surgical management with LAVH with BSO, posterior repair on 03/09/15 at 7:30 AM Follow up in 2 weeks for post op visit  Will provide lubricant information.    .mec 03/05/2015 10:30 AM    By signing my name below, I, Hansel Feinstein, attest that this documentation has been prepared under the direction and in the presence of Jonnie Kind, MD. Electronically Signed: Hansel Feinstein, ED Scribe. 03/05/2015. 10:30 AM.  I personally performed the services described in this documentation, which was SCRIBED in my presence. The recorded information has been reviewed and considered accurate. It has been edited as necessary during review. Jonnie Kind, MD

## 2015-03-09 ENCOUNTER — Encounter (HOSPITAL_COMMUNITY): Payer: Self-pay | Admitting: *Deleted

## 2015-03-09 ENCOUNTER — Ambulatory Visit (HOSPITAL_COMMUNITY): Payer: 59 | Admitting: Anesthesiology

## 2015-03-09 ENCOUNTER — Observation Stay (HOSPITAL_COMMUNITY)
Admission: RE | Admit: 2015-03-09 | Discharge: 2015-03-10 | Disposition: A | Discharge status: Home / Self Care | Payer: 59 | Source: Ambulatory Visit | Attending: Obstetrics and Gynecology | Admitting: Obstetrics and Gynecology

## 2015-03-09 ENCOUNTER — Encounter (HOSPITAL_COMMUNITY)
Admission: RE | Disposition: A | Discharge status: Home / Self Care | Payer: Self-pay | Source: Ambulatory Visit | Attending: Obstetrics and Gynecology

## 2015-03-09 DIAGNOSIS — N800 Endometriosis of uterus: Secondary | ICD-10-CM

## 2015-03-09 DIAGNOSIS — N854 Malposition of uterus: Secondary | ICD-10-CM | POA: Diagnosis not present

## 2015-03-09 DIAGNOSIS — F419 Anxiety disorder, unspecified: Secondary | ICD-10-CM | POA: Diagnosis not present

## 2015-03-09 DIAGNOSIS — N879 Dysplasia of cervix uteri, unspecified: Secondary | ICD-10-CM | POA: Diagnosis not present

## 2015-03-09 DIAGNOSIS — N816 Rectocele: Secondary | ICD-10-CM | POA: Insufficient documentation

## 2015-03-09 DIAGNOSIS — K219 Gastro-esophageal reflux disease without esophagitis: Secondary | ICD-10-CM | POA: Diagnosis not present

## 2015-03-09 DIAGNOSIS — Z79899 Other long term (current) drug therapy: Secondary | ICD-10-CM | POA: Insufficient documentation

## 2015-03-09 DIAGNOSIS — N941 Unspecified dyspareunia: Secondary | ICD-10-CM | POA: Diagnosis not present

## 2015-03-09 DIAGNOSIS — N72 Inflammatory disease of cervix uteri: Secondary | ICD-10-CM | POA: Diagnosis not present

## 2015-03-09 DIAGNOSIS — R102 Pelvic and perineal pain: Secondary | ICD-10-CM | POA: Diagnosis present

## 2015-03-09 DIAGNOSIS — N920 Excessive and frequent menstruation with regular cycle: Principal | ICD-10-CM | POA: Insufficient documentation

## 2015-03-09 DIAGNOSIS — Z90710 Acquired absence of both cervix and uterus: Secondary | ICD-10-CM | POA: Diagnosis present

## 2015-03-09 HISTORY — PX: LAPAROSCOPIC ASSISTED VAGINAL HYSTERECTOMY: SHX5398

## 2015-03-09 HISTORY — PX: RECTOCELE REPAIR: SHX761

## 2015-03-09 HISTORY — PX: SALPINGOOPHORECTOMY: SHX82

## 2015-03-09 SURGERY — COLPORRHAPHY, POSTERIOR, FOR RECTOCELE REPAIR
Anesthesia: General

## 2015-03-09 MED ORDER — PROPOFOL 10 MG/ML IV BOLUS
INTRAVENOUS | Status: DC | PRN
  Administered 2015-03-09: 50 mg via INTRAVENOUS
  Administered 2015-03-09: 150 mg via INTRAVENOUS

## 2015-03-09 MED ORDER — MIDAZOLAM HCL 2 MG/2ML IJ SOLN
INTRAMUSCULAR | Status: AC
  Filled 2015-03-09: qty 2

## 2015-03-09 MED ORDER — KETOROLAC TROMETHAMINE 30 MG/ML IJ SOLN
30.0000 mg | Freq: Four times a day (QID) | INTRAMUSCULAR | Status: DC
  Administered 2015-03-10: 30 mg via INTRAMUSCULAR
  Filled 2015-03-09: qty 1

## 2015-03-09 MED ORDER — KETOROLAC TROMETHAMINE 30 MG/ML IJ SOLN
INTRAMUSCULAR | Status: AC
  Filled 2015-03-09: qty 1

## 2015-03-09 MED ORDER — OXYCODONE-ACETAMINOPHEN 5-325 MG PO TABS: 1.0000 | ORAL_TABLET | ORAL | Status: DC | PRN

## 2015-03-09 MED ORDER — ONDANSETRON HCL 4 MG/2ML IJ SOLN
4.0000 mg | Freq: Once | INTRAMUSCULAR | Status: AC | PRN
  Administered 2015-03-09: 4 mg via INTRAVENOUS
  Filled 2015-03-09: qty 2

## 2015-03-09 MED ORDER — SODIUM CHLORIDE 0.9 % IR SOLN
Status: DC | PRN
  Administered 2015-03-09: 3000 mL

## 2015-03-09 MED ORDER — ONDANSETRON HCL 4 MG/2ML IJ SOLN
INTRAMUSCULAR | Status: AC
  Filled 2015-03-09: qty 2

## 2015-03-09 MED ORDER — BUPIVACAINE-EPINEPHRINE 0.5% -1:200000 IJ SOLN
INTRAMUSCULAR | Status: DC | PRN
  Administered 2015-03-09: 17 mL

## 2015-03-09 MED ORDER — ONDANSETRON HCL 4 MG/2ML IJ SOLN
4.0000 mg | Freq: Four times a day (QID) | INTRAMUSCULAR | Status: DC | PRN
  Administered 2015-03-09: 4 mg via INTRAVENOUS

## 2015-03-09 MED ORDER — ROCURONIUM BROMIDE 100 MG/10ML IV SOLN
INTRAVENOUS | Status: DC | PRN
  Administered 2015-03-09: 35 mg via INTRAVENOUS
  Administered 2015-03-09: 5 mg via INTRAVENOUS

## 2015-03-09 MED ORDER — DIPHENHYDRAMINE HCL 12.5 MG/5ML PO ELIX: 12.5000 mg | ORAL_SOLUTION | Freq: Four times a day (QID) | ORAL | Status: DC | PRN

## 2015-03-09 MED ORDER — MIDAZOLAM HCL 2 MG/2ML IJ SOLN
1.0000 mg | INTRAMUSCULAR | Status: DC | PRN
  Administered 2015-03-09: 2 mg via INTRAVENOUS

## 2015-03-09 MED ORDER — GLYCOPYRROLATE 0.2 MG/ML IJ SOLN
INTRAMUSCULAR | Status: DC | PRN
  Administered 2015-03-09: 0.4 mg via INTRAVENOUS

## 2015-03-09 MED ORDER — SODIUM CHLORIDE 0.9 % IJ SOLN
INTRAMUSCULAR | Status: AC
  Filled 2015-03-09: qty 10

## 2015-03-09 MED ORDER — SODIUM CHLORIDE 0.9 % IV SOLN
INTRAVENOUS | Status: DC
  Administered 2015-03-09 – 2015-03-10 (×4): via INTRAVENOUS

## 2015-03-09 MED ORDER — LIDOCAINE HCL (PF) 1 % IJ SOLN
INTRAMUSCULAR | Status: AC
  Filled 2015-03-09: qty 5

## 2015-03-09 MED ORDER — SODIUM CHLORIDE 0.9 % IR SOLN
Status: DC | PRN
Start: 2015-03-09 — End: 2015-03-09
  Administered 2015-03-09: 1000 mL

## 2015-03-09 MED ORDER — LACTATED RINGERS IV SOLN
INTRAVENOUS | Status: DC
  Administered 2015-03-09 (×3): via INTRAVENOUS

## 2015-03-09 MED ORDER — ONDANSETRON HCL 4 MG/2ML IJ SOLN
4.0000 mg | Freq: Four times a day (QID) | INTRAMUSCULAR | Status: DC | PRN
  Filled 2015-03-09: qty 2

## 2015-03-09 MED ORDER — GLYCOPYRROLATE 0.2 MG/ML IJ SOLN
INTRAMUSCULAR | Status: AC
  Filled 2015-03-09: qty 3

## 2015-03-09 MED ORDER — CEFAZOLIN SODIUM-DEXTROSE 2-3 GM-% IV SOLR
2.0000 g | INTRAVENOUS | Status: AC
  Administered 2015-03-09: 2 g via INTRAVENOUS
  Filled 2015-03-09: qty 50

## 2015-03-09 MED ORDER — DEXAMETHASONE SODIUM PHOSPHATE 4 MG/ML IJ SOLN
4.0000 mg | Freq: Once | INTRAMUSCULAR | Status: AC
  Administered 2015-03-09: 4 mg via INTRAVENOUS

## 2015-03-09 MED ORDER — ARTIFICIAL TEARS OP OINT
TOPICAL_OINTMENT | OPHTHALMIC | Status: AC
  Filled 2015-03-09: qty 3.5

## 2015-03-09 MED ORDER — ONDANSETRON HCL 4 MG/2ML IJ SOLN
4.0000 mg | Freq: Once | INTRAMUSCULAR | Status: AC
  Administered 2015-03-09: 4 mg via INTRAVENOUS

## 2015-03-09 MED ORDER — DIPHENHYDRAMINE HCL 50 MG/ML IJ SOLN: 12.5000 mg | Freq: Four times a day (QID) | INTRAMUSCULAR | Status: DC | PRN

## 2015-03-09 MED ORDER — FENTANYL CITRATE (PF) 100 MCG/2ML IJ SOLN
INTRAMUSCULAR | Status: DC | PRN
  Administered 2015-03-09 (×5): 50 ug via INTRAVENOUS

## 2015-03-09 MED ORDER — FENTANYL CITRATE (PF) 250 MCG/5ML IJ SOLN
INTRAMUSCULAR | Status: AC
  Filled 2015-03-09: qty 5

## 2015-03-09 MED ORDER — ONDANSETRON HCL 4 MG PO TABS: 4.0000 mg | ORAL_TABLET | Freq: Four times a day (QID) | ORAL | Status: DC | PRN

## 2015-03-09 MED ORDER — NEOSTIGMINE METHYLSULFATE 10 MG/10ML IV SOLN
INTRAVENOUS | Status: DC | PRN
  Administered 2015-03-09: 2 mg via INTRAVENOUS

## 2015-03-09 MED ORDER — SUFENTANIL CITRATE 50 MCG/ML IV SOLN
INTRAVENOUS | Status: AC
  Filled 2015-03-09: qty 1

## 2015-03-09 MED ORDER — ESTRADIOL 0.1 MG/24HR TD PTWK
0.1000 mg | MEDICATED_PATCH | TRANSDERMAL | Status: DC
  Administered 2015-03-09: 0.1 mg via TRANSDERMAL
  Filled 2015-03-09: qty 1

## 2015-03-09 MED ORDER — DEXAMETHASONE SODIUM PHOSPHATE 4 MG/ML IJ SOLN
INTRAMUSCULAR | Status: AC
  Filled 2015-03-09: qty 1

## 2015-03-09 MED ORDER — PROPOFOL 10 MG/ML IV BOLUS
INTRAVENOUS | Status: AC
  Filled 2015-03-09: qty 20

## 2015-03-09 MED ORDER — ROCURONIUM BROMIDE 50 MG/5ML IV SOLN
INTRAVENOUS | Status: AC
  Filled 2015-03-09: qty 1

## 2015-03-09 MED ORDER — LIDOCAINE HCL (CARDIAC) 10 MG/ML IV SOLN
INTRAVENOUS | Status: DC | PRN
  Administered 2015-03-09: 50 mg via INTRAVENOUS

## 2015-03-09 MED ORDER — IBUPROFEN 600 MG PO TABS
600.0000 mg | ORAL_TABLET | Freq: Four times a day (QID) | ORAL | Status: DC | PRN
  Administered 2015-03-10: 600 mg via ORAL
  Filled 2015-03-09: qty 1

## 2015-03-09 MED ORDER — SUFENTANIL CITRATE 50 MCG/ML IV SOLN
INTRAVENOUS | Status: DC | PRN
  Administered 2015-03-09 (×2): 10 ug via INTRAVENOUS
  Administered 2015-03-09 (×2): 5 ug via INTRAVENOUS

## 2015-03-09 MED ORDER — HYDROMORPHONE 1 MG/ML IV SOLN
INTRAVENOUS | Status: DC
  Administered 2015-03-09: 1.3 mg via INTRAVENOUS
  Administered 2015-03-09: 0.6 mg via INTRAVENOUS
  Administered 2015-03-10: 0.3 mg via INTRAVENOUS
  Administered 2015-03-10: 0.9 mg via INTRAVENOUS
  Filled 2015-03-09 (×2): qty 25

## 2015-03-09 MED ORDER — KETOROLAC TROMETHAMINE 30 MG/ML IJ SOLN
30.0000 mg | Freq: Four times a day (QID) | INTRAMUSCULAR | Status: DC
  Administered 2015-03-10 (×2): 30 mg via INTRAVENOUS
  Filled 2015-03-09 (×3): qty 1

## 2015-03-09 MED ORDER — BUPIVACAINE-EPINEPHRINE (PF) 0.5% -1:200000 IJ SOLN
INTRAMUSCULAR | Status: AC
  Filled 2015-03-09: qty 30

## 2015-03-09 MED ORDER — SODIUM CHLORIDE 0.9% FLUSH: 9.0000 mL | INTRAVENOUS | Status: DC | PRN

## 2015-03-09 MED ORDER — NEOSTIGMINE METHYLSULFATE 10 MG/10ML IV SOLN
INTRAVENOUS | Status: AC
  Filled 2015-03-09: qty 1

## 2015-03-09 MED ORDER — PANTOPRAZOLE SODIUM 40 MG PO TBEC
40.0000 mg | DELAYED_RELEASE_TABLET | Freq: Every day | ORAL | Status: DC
  Administered 2015-03-10: 40 mg via ORAL
  Filled 2015-03-09: qty 1

## 2015-03-09 MED ORDER — FENTANYL CITRATE (PF) 100 MCG/2ML IJ SOLN
25.0000 ug | INTRAMUSCULAR | Status: DC | PRN
  Administered 2015-03-09 (×2): 50 ug via INTRAVENOUS
  Filled 2015-03-09: qty 2

## 2015-03-09 MED ORDER — NALOXONE HCL 0.4 MG/ML IJ SOLN: 0.4000 mg | INTRAMUSCULAR | Status: DC | PRN

## 2015-03-09 MED ORDER — KETOROLAC TROMETHAMINE 30 MG/ML IJ SOLN
30.0000 mg | Freq: Once | INTRAMUSCULAR | Status: AC
  Administered 2015-03-09: 30 mg via INTRAVENOUS

## 2015-03-09 SURGICAL SUPPLY — 81 items
APPLIER CLIP 13 LRG OPEN (CLIP)
APPLIER CLIP UNV 5X34 EPIX (ENDOMECHANICALS) IMPLANT
BAG HAMPER (MISCELLANEOUS) ×3 IMPLANT
BANDAGE STRIP 1X3 FLEXIBLE (GAUZE/BANDAGES/DRESSINGS) ×12 IMPLANT
BLADE SURG SZ11 CARB STEEL (BLADE) ×3 IMPLANT
CELLS DAT CNTRL 66122 CELL SVR (MISCELLANEOUS) IMPLANT
CLIP APPLIE 13 LRG OPEN (CLIP) IMPLANT
CLOSURE STERI-STRIP 1/4X4 (GAUZE/BANDAGES/DRESSINGS) ×3 IMPLANT
CLOTH BEACON ORANGE TIMEOUT ST (SAFETY) ×3 IMPLANT
COVER LIGHT HANDLE STERIS (MISCELLANEOUS) ×6 IMPLANT
DECANTER SPIKE VIAL GLASS SM (MISCELLANEOUS) ×3 IMPLANT
DRAPE PROXIMA HALF (DRAPES) IMPLANT
DRAPE STERI URO 9X17 APER PCH (DRAPES) ×3 IMPLANT
DRAPE WARM FLUID 44X44 (DRAPE) IMPLANT
DRESSING TELFA 8X3 (GAUZE/BANDAGES/DRESSINGS) ×3 IMPLANT
ELECT REM PT RETURN 9FT ADLT (ELECTROSURGICAL)
ELECTRODE REM PT RTRN 9FT ADLT (ELECTROSURGICAL) IMPLANT
FILTER SMOKE EVAC LAPAROSHD (FILTER) ×3 IMPLANT
FORMALIN 10 PREFIL 480ML (MISCELLANEOUS) IMPLANT
GAUZE PACKING 2X5 YD STRL (GAUZE/BANDAGES/DRESSINGS) ×3 IMPLANT
GAUZE SPONGE 4X4 16PLY XRAY LF (GAUZE/BANDAGES/DRESSINGS) IMPLANT
GLOVE BIOGEL PI IND STRL 7.0 (GLOVE) ×10 IMPLANT
GLOVE BIOGEL PI IND STRL 9 (GLOVE) ×4 IMPLANT
GLOVE BIOGEL PI INDICATOR 7.0 (GLOVE) ×5
GLOVE BIOGEL PI INDICATOR 9 (GLOVE) ×2
GLOVE ECLIPSE 6.5 STRL STRAW (GLOVE) ×9 IMPLANT
GLOVE ECLIPSE 9.0 STRL (GLOVE) ×9 IMPLANT
GOWN SPEC L3 XXLG W/TWL (GOWN DISPOSABLE) ×6 IMPLANT
GOWN STRL REUS W/TWL LRG LVL3 (GOWN DISPOSABLE) ×9 IMPLANT
INST SET LAPROSCOPIC GYN AP (KITS) ×3 IMPLANT
INST SET MAJOR GENERAL (KITS) IMPLANT
IV NS IRRIG 3000ML ARTHROMATIC (IV SOLUTION) ×3 IMPLANT
KIT BLADEGUARD II DBL (SET/KITS/TRAYS/PACK) ×3 IMPLANT
KIT ROOM TURNOVER AP CYSTO (KITS) ×3 IMPLANT
KIT ROOM TURNOVER APOR (KITS) IMPLANT
LUBRICANT JELLY 4.5OZ STERILE (MISCELLANEOUS) IMPLANT
MANIFOLD NEPTUNE II (INSTRUMENTS) ×3 IMPLANT
NEEDLE HYPO 25X1 1.5 SAFETY (NEEDLE) ×3 IMPLANT
NEEDLE INSUFFLATION 120MM (ENDOMECHANICALS) ×3 IMPLANT
NS IRRIG 1000ML POUR BTL (IV SOLUTION) ×3 IMPLANT
PACK ABDOMINAL MAJOR (CUSTOM PROCEDURE TRAY) IMPLANT
PACK BASIC III (CUSTOM PROCEDURE TRAY) ×1
PACK PERI GYN (CUSTOM PROCEDURE TRAY) ×3 IMPLANT
PACK SRG BSC III STRL LF ECLPS (CUSTOM PROCEDURE TRAY) ×2 IMPLANT
PAD ARMBOARD 7.5X6 YLW CONV (MISCELLANEOUS) ×3 IMPLANT
RETRACTOR WND ALEXIS 25 LRG (MISCELLANEOUS) IMPLANT
RTRCTR WOUND ALEXIS 18CM MED (MISCELLANEOUS)
RTRCTR WOUND ALEXIS 25CM LRG (MISCELLANEOUS)
SET BASIN LINEN APH (SET/KITS/TRAYS/PACK) ×3 IMPLANT
SHEARS HARMONIC ACE PLUS 36CM (ENDOMECHANICALS) ×3 IMPLANT
SOL PREP PROV IODINE SCRUB 4OZ (MISCELLANEOUS) ×3 IMPLANT
SOLUTION ANTI FOG 6CC (MISCELLANEOUS) ×3 IMPLANT
STAPLER VISISTAT 35W (STAPLE) ×3 IMPLANT
STRIP CLOSURE SKIN 1/4X3 (GAUZE/BANDAGES/DRESSINGS) ×3 IMPLANT
SUT CHROMIC 0 CT 1 (SUTURE) ×30 IMPLANT
SUT CHROMIC 2 0 CT 1 (SUTURE) ×6 IMPLANT
SUT MON AB 3-0 SH 27 (SUTURE) IMPLANT
SUT PROLENE 0 CT 1 30 (SUTURE) IMPLANT
SUT PROLENE 2 0 FS (SUTURE) ×3 IMPLANT
SUT VIC AB 0 CT1 27 (SUTURE) ×1
SUT VIC AB 0 CT1 27XBRD ANTBC (SUTURE) ×2 IMPLANT
SUT VIC AB 0 CT2 8-18 (SUTURE) ×3 IMPLANT
SUT VIC AB 0 CTX 36 (SUTURE) ×1
SUT VIC AB 0 CTX36XBRD ANTBCTR (SUTURE) ×2 IMPLANT
SUT VIC AB 2-0 CT1 27 (SUTURE)
SUT VIC AB 2-0 CT1 TAPERPNT 27 (SUTURE) IMPLANT
SUT VIC AB 4-0 PS2 27 (SUTURE) ×3 IMPLANT
SUT VICRYL 0 UR6 27IN ABS (SUTURE) ×3 IMPLANT
SUT VICRYL 3 0 (SUTURE) IMPLANT
SYR BULB IRRIGATION 50ML (SYRINGE) ×3 IMPLANT
SYR CONTROL 10ML LL (SYRINGE) ×6 IMPLANT
SYRINGE 10CC LL (SYRINGE) IMPLANT
TOWEL OR 17X26 4PK STRL BLUE (TOWEL DISPOSABLE) ×3 IMPLANT
TRAY FOLEY CATH SILVER 16FR (SET/KITS/TRAYS/PACK) ×3 IMPLANT
TROCAR ENDO BLADELESS 11MM (ENDOMECHANICALS) ×3 IMPLANT
TROCAR XCEL NON-BLD 5MMX100MML (ENDOMECHANICALS) ×3 IMPLANT
TROCAR XCEL UNIV SLVE 11M 100M (ENDOMECHANICALS) ×6 IMPLANT
TUBING INSUFFLATION (TUBING) ×3 IMPLANT
VERSALIGHT (MISCELLANEOUS) ×3 IMPLANT
WARMER LAPAROSCOPE (MISCELLANEOUS) ×3 IMPLANT
WATER STERILE IRR 1000ML POUR (IV SOLUTION) ×3 IMPLANT

## 2015-03-09 NOTE — Anesthesia Postprocedure Evaluation (Signed)
Anesthesia Post Note  Patient: [name]  Procedure(s) Performed: Procedure(s) (LRB): POSTERIOR REPAIR (RECTOCELE) (N/A) LAPAROSCOPIC ASSISTED VAGINAL HYSTERECTOMY (N/A) SALPINGO OOPHORECTOMY (Bilateral)  Patient location during evaluation: PACU Anesthesia Type: General Level of consciousness: awake and patient uncooperative Pain management: satisfactory to patient Vital Signs Assessment: post-procedure vital signs reviewed and stable Respiratory status: spontaneous breathing and non-rebreather facemask Cardiovascular status: stable Anesthetic complications: no    Last Vitals:  Filed Vitals:   03/09/15 1030 03/09/15 1045  BP: 142/75 138/79  Pulse:  69  Temp:    Resp:  12    Last Pain:  Filed Vitals:   03/09/15 1057  PainSc: 5                  Aleea Hendry

## 2015-03-09 NOTE — Anesthesia Preprocedure Evaluation (Signed)
Anesthesia Evaluation  Patient identified by MRN, date of birth, ID band Patient awake    Reviewed: Allergy & Precautions, NPO status , Patient's Chart, lab work & pertinent test results  Airway Mallampati: II  TM Distance: >3 FB     Dental  (+) Teeth Intact, Dental Advisory Given   Pulmonary neg pulmonary ROS,    breath sounds clear to auscultation       Cardiovascular + dysrhythmias Supra Ventricular Tachycardia  Rhythm:Regular Rate:Normal     Neuro/Psych  Headaches, PSYCHIATRIC DISORDERS Anxiety    GI/Hepatic GERD  Controlled,  Endo/Other    Renal/GU      Musculoskeletal   Abdominal   Peds  Hematology   Anesthesia Other Findings   Reproductive/Obstetrics                             Anesthesia Physical Anesthesia Plan  ASA: II  Anesthesia Plan: General   Post-op Pain Management:    Induction: Intravenous, Rapid sequence and Cricoid pressure planned  Airway Management Planned: Oral ETT  Additional Equipment:   Intra-op Plan:   Post-operative Plan: Extubation in OR  Informed Consent: I have reviewed the patients History and Physical, chart, labs and discussed the procedure including the risks, benefits and alternatives for the proposed anesthesia with the patient or authorized representative who has indicated his/her understanding and acceptance.     Plan Discussed with:   Anesthesia Plan Comments:         Anesthesia Quick Evaluation

## 2015-03-09 NOTE — Interval H&P Note (Signed)
History and Physical Interval Note:  03/09/2015 7:17 AM  [name]  has presented today for surgery, with the diagnosis of menorrhagia dyspareunia uterine retroversion rectocele  The various methods of treatment have been discussed with the patient and family. After consideration of risks, benefits and other options for treatment, the patient has consented to  Procedure(s): POSTERIOR REPAIR (RECTOCELE) (N/A) LAPAROSCOPIC ASSISTED VAGINAL HYSTERECTOMY (N/A) SALPINGO OOPHORECTOMY (Bilateral) as a surgical intervention .  The patient's history has been reviewed, patient examined, no change in status, stable for surgery.  I have reviewed the patient's chart and labs.  Questions were answered to the patient's satisfaction.     Jonnie Kind

## 2015-03-09 NOTE — Transfer of Care (Signed)
Immediate Anesthesia Transfer of Care Note  Patient: [name]  Procedure(s) Performed: Procedure(s): POSTERIOR REPAIR (RECTOCELE) (N/A) LAPAROSCOPIC ASSISTED VAGINAL HYSTERECTOMY (N/A) SALPINGO OOPHORECTOMY (Bilateral)  Patient Location: PACU  Anesthesia Type:General  Level of Consciousness: sedated and patient cooperative  Airway & Oxygen Therapy: Patient Spontanous Breathing and non-rebreather face mask  Post-op Assessment: Report given to RN, Post -op Vital signs reviewed and stable and Patient moving all extremities  Post vital signs: Reviewed and stable    Complications: No apparent anesthesia complications

## 2015-03-09 NOTE — Brief Op Note (Signed)
03/09/2015  10:33 AM  PATIENT:  [name]  50 y.o. female  PRE-OPERATIVE DIAGNOSIS:  menorrhagia dyspareunia uterine retroversion rectocele  POST-OPERATIVE DIAGNOSIS:  menorrhagia dyspareunia, uterine retroversion, rectocele  PROCEDURE:  Procedure(s): POSTERIOR REPAIR (RECTOCELE) (N/A) LAPAROSCOPIC ASSISTED VAGINAL HYSTERECTOMY (N/A) SALPINGO OOPHORECTOMY (Bilateral)  SURGEON:  Surgeon(s) and Role:    * Jonnie Kind, MD - Primary  PHYSICIAN ASSISTANT:   ASSISTANTS: Tammy Dallas RN FA   ANESTHESIA:   local and general  EBL:  Total I/O In: 2300 [I.V.:2300] Out: 1100 [Urine:1000; Blood:100]  BLOOD ADMINISTERED:none  DRAINS: Urinary Catheter (Foley) and Vaginal pack   LOCAL MEDICATIONS USED:  MARCAINE    and Amount: 20 ml  SPECIMEN:  Source of Specimen:  Cervix uterus tubes and ovaries, posterior vaginal epithelium  DISPOSITION OF SPECIMEN:  PATHOLOGY  COUNTS:  YES  TOURNIQUET:  * No tourniquets in log *  DICTATION: .Dragon Dictation  PLAN OF CARE: Admit for overnight observation  PATIENT DISPOSITION:  PACU - hemodynamically stable.   Delay start of Pharmacological VTE agent (>24hrs) due to surgical blood loss or risk of bleeding: not applicable

## 2015-03-09 NOTE — Anesthesia Procedure Notes (Signed)
Procedure Name: Intubation Date/Time: 03/09/2015 7:48 AM Performed by: Vista Deck Pre-anesthesia Checklist: Patient identified, Patient being monitored, Timeout performed, Emergency Drugs available and Suction available Patient Re-evaluated:Patient Re-evaluated prior to inductionOxygen Delivery Method: Circle System Utilized Preoxygenation: Pre-oxygenation with 100% oxygen Intubation Type: IV induction, Rapid sequence and Cricoid Pressure applied Ventilation: Mask ventilation without difficulty Laryngoscope Size: Miller and 2 Grade View: Grade I Tube type: Oral Tube size: 7.0 mm Number of attempts: 1 Airway Equipment and Method: Stylet and Oral airway Placement Confirmation: ETT inserted through vocal cords under direct vision,  positive ETCO2 and breath sounds checked- equal and bilateral Secured at: 22 cm Tube secured with: Tape Dental Injury: Teeth and Oropharynx as per pre-operative assessment

## 2015-03-09 NOTE — Interval H&P Note (Signed)
History and Physical Interval Note:  03/09/2015 7:16 AM  [name]  has presented today for surgery, with the diagnosis of menorrhagia dyspareunia uterine retroversion rectocele  The various methods of treatment have been discussed with the patient and family. After consideration of risks, benefits and other options for treatment, the patient has consented to  Procedure(s): POSTERIOR REPAIR (RECTOCELE) (N/A) LAPAROSCOPIC ASSISTED VAGINAL HYSTERECTOMY (N/A) SALPINGO OOPHORECTOMY (Bilateral) as a surgical intervention .  The patient's history has been reviewed, patient examined, no change in status, stable for surgery.  I have reviewed the patient's chart and labs.  Questions were answered to the patient's satisfaction.     Jonnie Kind

## 2015-03-09 NOTE — Op Note (Signed)
03/09/2015  10:33 AM  PATIENT:  [name]  50 y.o. female  PRE-OPERATIVE DIAGNOSIS:  menorrhagia dyspareunia uterine retroversion rectocele  POST-OPERATIVE DIAGNOSIS:  menorrhagia dyspareunia, uterine retroversion, rectocele  PROCEDURE:  Procedure(s): POSTERIOR REPAIR (RECTOCELE) (N/A) LAPAROSCOPIC ASSISTED VAGINAL HYSTERECTOMY (N/A) SALPINGO OOPHORECTOMY (Bilateral)  SURGEON:  Surgeon(s) and Role:    * Jonnie Kind, MD - Primary  PHYSICIAN ASSISTANT:   ASSISTANTS: Tammy Dallas RN FA   ANESTHESIA:   local and general  EBL:  Total I/O In: 2300 [I.V.:2300] Out: 1100 [Urine:1000; Blood:100]  BLOOD ADMINISTERED:none  DRAINS: Urinary Catheter (Foley) and Vaginal pack   LOCAL MEDICATIONS USED:  MARCAINE    and Amount: 20 ml  SPECIMEN:  Source of Specimen:  Cervix uterus tubes and ovaries, posterior vaginal epithelium  DISPOSITION OF SPECIMEN:  PATHOLOGY  COUNTS:  YES  TOURNIQUET:  * No tourniquets in log *  DICTATION: .Dragon Dictation  PLAN OF CARE: Admit for overnight observation  PATIENT DISPOSITION:  PACU - hemodynamically stable.   Delay start of Pharmacological VTE agent (>24hrs) due to surgical blood loss or risk of bleeding: not applicable  Indications-year-old female with heavy undesirable heavy periods, dyspareunia due to uterine contact, uterine retroversion, and symptomatic rectocele.  Details of procedure: Patient was taken operating room prepped and draped for combined abdominal and vaginal procedure, with legs in low lithotomy support, with Foley catheter and inserted and timeout conducted after abdomen prepping and draping. Speculum was inserted, uterine manipulator placed in the uterus. It was then directed to the abdomen where an infraumbilical vertical 1 cm skin incision was made as well as a transverse suprapubic and bilateral lower quadrant incisions 1 cm in length lateral to the umbilicus on each side, with Veress needle introduced into  the umbilicus pneumoperitoneum achieved under 6 mmHg intra-abdominal pressure with water droplet technique confirmed intraperitoneal location. Pneumoperitoneum was followed under direct insertion of the camera laparoscopic 11 mm trocar at the umbilicus under direct visualization with inspection the abdominal cavity with no evidence of bleeding or trauma associated with entry the other 35 mm trochars were then placed under direct visualization and then attention directed the pelvis. Attention was first directed to the right side where the right round ligament could be identified elevated and transected with harmonic scalpel. The tube and ovary could be elevated and the ureter visualized in the retroperitoneum well away from the IP ligament on this side. With a Monarch scalpel, the right IP ligament was taken down using the harmonic Ace 7 coagulation settings the tube and ovary were left attached to the uterus, and the broad ligament taken down to just lateral and above the uterine vessels bladder flap was then taken down anteriorly halfway across attention was then directed to the left side where a similar process was performed with ureter clearly visualized well away from the surgical action. The bladder flap was completed in its mobilization, and sharply dissected with the Harmonic scalpel inferiorly to the bladder bladder dome off the cervix and vaginal apex the laparoscopic procedure was portion of the case was considered satisfactorily completed. The abdomen was deflated laparoscopic instruments removed the trochars extracted to barely inside the abdomen., With suprapubic trocar completely removed. Attention to the vaginal portion the case was then begun. The legs were placed in standard low weighted lithotomy position to allow vaginal access. The surgeon repositioned himself and was seated at the vagina, with weighted speculum in place. The cervix could be placed under traction, and the cervix infiltrated  around  its periphery with Marcaine with epinephrine followed by sharp dissection with the Bovie unipolar cautery to free the vaginal epithelium from around the cervix. A anterior sharp dissection allowed the bladder to be completely mobilized anteriorly and the peritoneal cavity entered easily. A colpotomy incision was performed cul-de-sac identified. The right uterosacral ligament could be clamped cut and and suture ligated and tagged with curved hemostat applied to its tag and the opposite uterosacral ligament treated similarly. Lower cardinal ligament was visualized, clamped cut and suture ligated on the left side, and the upper cardinal ligament treated similarly. Attention was then placed on the alternate side, the right side which was treated similarly. Remaining small remnant of tissue on each side was then identified, with laparotomy tape in position to hold the bowel out of the way, and the last pedicle remnants of the broad ligament clamped cut and suture ligated on each side. Uterus could be extracted. Pedicles were inspected and were confirmed as hemostatic. There was some small oozing from the posterior vaginal cuff. The cul-de-sac was considered wider than ideal Thomas reduce this diameter, a 2-0 Prolene suture was used seeing the medial aspect of the sacral ligament on each side just inside the sling ligated pedicle and suturing approximately 1 cm of the lateral aspects of the pouch of Douglas to the ipsilateral echo ligament this was tied down resulting in his transverse diameter of the cul-de-sac. The remaining portion of the could be then used to close the peritoneum pulling the bladder flap anteriorly down and attaching in the continuous running fashion across peritoneal edge at the colpotomy site. The vaginal cuff was then closed areas of interrupted 0 chromic sutures, incorporating the uterosacral ligament pedicles into the closure to allow for improved cuff support. Hemostasis was good.  Ottoman counts were correct at this time. Posterior repair. The repair then followed, with Marcaine injection into the use of the area perineal body the transverse and opened at the level of the hymen remnants and sharply and bluntly combined dissection used to separate the vaginal epithelium for distance proximally half the length of the vagina, with Allis clamps you then used in the rectovaginal septum to identify good supportive tissues that could be pulled together in midline. It appeared that the connective tissue defect was on the left side of the midline as generous amount of tissue could be identified on the right side that could be elevated cephalad and to the left and resulted in significantly improved perineal body. This was confirmed with a double gloved index finger in the rectum. Once this was confirmed close were changed, and the suturing across the rectovaginal septum performed with interrupted 0 Vicryl 4 sutures. The perineal body improved support was noted. Introitus still allowed 2 fingerbreadths vaginal entrance vaginal epithelium was then sutured in place him in a continuous running 2-0 chromic fashion. Vaginal packing with Betadine soaked gauze and and Vaseline were used to pressure and support to the cuff postoperatively.  We then changed gloves and gowns and went back up and inspected the pelvis from above. The ureters could be visualized as well away from the suturing and the peritoneum was closed with minimal blood in the pelvis. Patient was allowed to waken good recovery in good condition. Intraoperative urine output 600 cc with sponge and needle counts correct

## 2015-03-10 ENCOUNTER — Encounter (HOSPITAL_COMMUNITY): Payer: Self-pay | Admitting: Obstetrics and Gynecology

## 2015-03-10 DIAGNOSIS — F419 Anxiety disorder, unspecified: Secondary | ICD-10-CM | POA: Diagnosis not present

## 2015-03-10 DIAGNOSIS — N816 Rectocele: Secondary | ICD-10-CM | POA: Diagnosis not present

## 2015-03-10 DIAGNOSIS — Z79899 Other long term (current) drug therapy: Secondary | ICD-10-CM | POA: Diagnosis not present

## 2015-03-10 DIAGNOSIS — N920 Excessive and frequent menstruation with regular cycle: Secondary | ICD-10-CM | POA: Diagnosis not present

## 2015-03-10 DIAGNOSIS — N854 Malposition of uterus: Secondary | ICD-10-CM | POA: Diagnosis not present

## 2015-03-10 DIAGNOSIS — K219 Gastro-esophageal reflux disease without esophagitis: Secondary | ICD-10-CM | POA: Diagnosis not present

## 2015-03-10 DIAGNOSIS — N941 Unspecified dyspareunia: Secondary | ICD-10-CM | POA: Diagnosis not present

## 2015-03-10 LAB — BASIC METABOLIC PANEL
Anion gap: 5 (ref 5–15)
BUN: 10 mg/dL (ref 6–20)
CALCIUM: 7.8 mg/dL — AB (ref 8.9–10.3)
CHLORIDE: 111 mmol/L (ref 101–111)
CO2: 24 mmol/L (ref 22–32)
CREATININE: 0.69 mg/dL (ref 0.44–1.00)
GFR calc non Af Amer: >60 mL/min (ref >60)
Glucose, Bld: 101 mg/dL — ABNORMAL HIGH (ref 65–99)
Potassium: 3.8 mmol/L (ref 3.5–5.1)
SODIUM: 140 mmol/L (ref 135–145)

## 2015-03-10 LAB — CBC
HCT: 29.7 % — ABNORMAL LOW (ref 36.0–46.0)
Hemoglobin: 9.7 g/dL — ABNORMAL LOW (ref 12.0–15.0)
MCH: 28.5 pg (ref 26.0–34.0)
MCHC: 32.7 g/dL (ref 30.0–36.0)
MCV: 87.4 fL (ref 78.0–100.0)
PLATELETS: 325 10*3/uL (ref 150–400)
RBC: 3.4 MIL/uL — ABNORMAL LOW (ref 3.87–5.11)
RDW: 14.5 % (ref 11.5–15.5)
WBC: 8.5 10*3/uL (ref 4.0–10.5)

## 2015-03-10 MED ORDER — KETOROLAC TROMETHAMINE 10 MG PO TABS: 10.0000 mg | ORAL_TABLET | Freq: Four times a day (QID) | ORAL | Status: DC | PRN

## 2015-03-10 MED ORDER — ESTRADIOL 0.1 MG/24HR TD PTWK: 0.1000 mg | MEDICATED_PATCH | TRANSDERMAL | Status: DC

## 2015-03-10 MED ORDER — POLYETHYLENE GLYCOL 3350 17 GM/SCOOP PO POWD: 17.0000 g | Freq: Every day | ORAL | Status: DC

## 2015-03-10 MED ORDER — HYDROCODONE-ACETAMINOPHEN 5-325 MG PO TABS: 1.0000 | ORAL_TABLET | Freq: Four times a day (QID) | ORAL | Status: DC | PRN

## 2015-03-10 NOTE — Progress Notes (Signed)
Left via wheelchair accompanied by staff for discharge home in care of family.  Stable at discharge.

## 2015-03-10 NOTE — Discharge Summary (Signed)
Physician Discharge Summary  Patient ID: [name] MRN: [id number] DOB/AGE: Jun 08, 1965 50 y.o.  Admit date: 03/09/2015 Discharge date: 03/10/2015  Admission Diagnoses:Active Problems:   Pelvic pain in female   Menorrhagia   Rectocele   Dyspareunia in female, uterine retroversion    Discharge Diagnoses:  Active Problems:   Pelvic pain in female   Menorrhagia   Rectocele   Dyspareunia in female, uterine retroversion   S/P laparoscopic assisted vaginal hysterectomy (LAVH)   Discharged Condition: good  Hospital Course: pt was admitted postop s/p LAVH BSO, posterior repair, with 150 cc EBL, foley catheter in place and vaginal packing in place. Pt pain controlled with dilaudid PCA. Good urine output overnight. Pain levels 2-4 overnite. Foley and vag pack out at 7 am, will d/c once voiding spontaneously  Consults: None  Significant Diagnostic Studies: labs:  CBC Latest Ref Rng 03/10/2015 03/05/2015 02/05/2015  WBC 4.0 - 10.5 K/uL 8.5 7.0 6.3  Hemoglobin 12.0 - 15.0 g/dL 9.7(L) 11.7(L) 11.3(L)  Hematocrit 36.0 - 46.0 % 29.7(L) 36.1 34.7(L)  Platelets 150 - 400 K/uL 325 368 437(H)    BMP Latest Ref Rng 03/10/2015 03/05/2015 02/05/2015  Glucose 65 - 99 mg/dL 101(H) 95 85  BUN 6 - 20 mg/dL 10 16 10   Creatinine 0.44 - 1.00 mg/dL 0.69 0.93 0.76  Sodium 135 - 145 mmol/L 140 140 139  Potassium 3.5 - 5.1 mmol/L 3.8 3.8 4.2  Chloride 101 - 111 mmol/L 111 108 108  CO2 22 - 32 mmol/L 24 26 26   Calcium 8.9 - 10.3 mg/dL 7.8(L) 9.0 9.0      Treatments: surgery: LAVH, BSO, posterior repairl  Discharge Exam: Blood pressure 105/52, pulse 92, temperature 97.8 F (36.6 C), temperature source Oral, resp. rate 17, height 5\' 4"  (1.626 m), weight 218 lb 14.7 oz (99.3 kg), SpO2 100 %. General appearance: alert, cooperative, appears stated age and no distress Head: Normocephalic, without obvious abnormality, atraumatic Eyes: conjunctivae/corneas clear. PERRL, EOM's intact. Fundi benign. Resp: clear  to auscultation bilaterally GI: normal findings: bowel sounds normal Extremities: extremities normal, atraumatic, no cyanosis or edema Incision/Wound:clean dry laparoscopy sites.  Disposition: 01-Home or Self Care  Discharge Instructions    Call MD for:  persistant nausea and vomiting    Complete by:  As directed      Call MD for:  temperature >100.4    Complete by:  As directed      Diet - low sodium heart healthy    Complete by:  As directed      Discharge instructions    Complete by:  As directed   Stool softener daily x 2 wk( miralax),  Check temp daily Begin estrogen patch     Increase activity slowly    Complete by:  As directed             Medication List    STOP taking these medications        ibuprofen 200 MG tablet  Commonly known as:  ADVIL,MOTRIN      TAKE these medications        estradiol 0.1 mg/24hr patch  Commonly known as:  CLIMARA - Dosed in mg/24 hr  Place 1 patch (0.1 mg total) onto the skin once a week.     HYDROcodone-acetaminophen 5-325 MG tablet  Commonly known as:  NORCO/VICODIN  Take 1 tablet by mouth every 6 (six) hours as needed.     ketorolac 10 MG tablet  Commonly known as:  TORADOL  Take 1  tablet (10 mg total) by mouth every 6 (six) hours as needed (five day limit postop).     multivitamin with minerals Tabs tablet  Take 1 tablet by mouth daily.     polyethylene glycol powder powder  Commonly known as:  MIRALAX  Take 17 g by mouth daily. To prevent constipation     Probiotic 250 MG Caps  Take 1 capsule by mouth daily.           Follow-up Information    Follow up with Jonnie Kind, MD In 2 weeks.   Specialties:  Obstetrics and Gynecology, Radiology   Why:  Postoperative visit   Contact information:   [place] [address] [phone]       Signed: Jonnie Kind 03/10/2015, 7:51 AM

## 2015-03-10 NOTE — Progress Notes (Signed)
IV access removed per orders, discharge instructions reviewed with patient, questions answered, understanding verbalized.  Waiting transportation.

## 2015-03-10 NOTE — Progress Notes (Addendum)
Foley catheter removed without problems, pt instructed to call for assistance when needing to void, instructed not to get out of bed alone, 1000 ml in  Foley bag.

## 2015-03-10 NOTE — Discharge Instructions (Signed)
Hysterectomy Information  A hysterectomy is a surgery to remove your uterus. After surgery, you will no longer have periods. Also, you will not be able to get pregnant.   REASONS FOR THIS SURGERY   You have bleeding that is not normal and keeps coming back.   You have lasting (chronic) lower belly (pelvic) pain.   You have a lasting infection.   The lining of your uterus grows outside your uterus.   The lining of your uterus grows in the muscle of your uterus.   Your uterus falls down into your vagina.   You have a growth in your uterus that causes problems.   You have cells that could turn into cancer (precancerous cells).   You have cancer of the uterus or cervix.  TYPES   There are 3 types of hysterectomies. Depending on the type, the surgery will:   Remove the top part of the uterus only.   Remove the uterus and the cervix.   Remove the uterus, cervix, and tissue that holds the uterus in place in the lower belly.  WAYS A HYSTERECTOMY CAN BE PERFORMED  There are 5 ways this surgery can be performed.    A cut (incision) is made in the belly (abdomen). The uterus is taken out through the cut.   A cut is made in the vagina. The uterus is taken out through the cut.   Three or four cuts are made in the belly. A surgical device with a camera is put through one of the cuts. The uterus is cut into small pieces. The uterus is taken out through the cuts or the vagina.   Three or four cuts are made in the belly. A surgical device with a camera is put through one of the cuts. The uterus is taken out through the vagina.   Three or four cuts are made in the belly. A surgical device that is controlled by a computer makes a visual image. The device helps the surgeon control the surgical tools. The uterus is cut into small pieces. The pieces are taken out through the cuts or through the vagina.  WHAT TO EXPECT AFTER THE SURGERY   You will be given pain medicine.   You will need help at home for 3-5 days after  surgery.   You will need to see your doctor in 2-4 weeks after surgery.   You may get hot flashes, have night sweats, and have trouble sleeping.   You may need to have Pap tests in the future if your surgery was related to cancer. Talk to your doctor. It is still good to have regular exams.     This information is not intended to replace advice given to you by your health care provider. Make sure you discuss any questions you have with your health care provider.     Document Released: 04/10/2011 Document Revised: 11/06/2012 Document Reviewed: 09/23/2012  Elsevier Interactive Patient Education 2016 [employer]

## 2015-03-11 ENCOUNTER — Other Ambulatory Visit (HOSPITAL_COMMUNITY): Payer: 59

## 2015-03-12 ENCOUNTER — Other Ambulatory Visit (HOSPITAL_COMMUNITY): Payer: 59

## 2015-03-12 ENCOUNTER — Encounter: Payer: 59 | Admitting: Obstetrics and Gynecology

## 2015-03-12 ENCOUNTER — Telehealth: Payer: Self-pay | Admitting: Obstetrics and Gynecology

## 2015-03-12 NOTE — Telephone Encounter (Signed)
Pt states has surgery with Dr. Glo Herring on 03/09/2015 stitches in abdominal area and right at vagina area. Per Derrek Monaco, NP cover areas that have stitches, and dry well. Pt informed not to soak in bath tub. Pt verbalized understanding.

## 2015-03-15 ENCOUNTER — Encounter: Payer: 59 | Admitting: Obstetrics and Gynecology

## 2015-03-23 ENCOUNTER — Ambulatory Visit (INDEPENDENT_AMBULATORY_CARE_PROVIDER_SITE_OTHER): Payer: 59 | Admitting: Obstetrics and Gynecology

## 2015-03-23 ENCOUNTER — Encounter: Payer: Self-pay | Admitting: Obstetrics and Gynecology

## 2015-03-23 VITALS — BP 120/80 | Ht 64.5 in | Wt 213.0 lb

## 2015-03-23 DIAGNOSIS — Z9889 Other specified postprocedural states: Secondary | ICD-10-CM

## 2015-03-23 DIAGNOSIS — Z90710 Acquired absence of both cervix and uterus: Secondary | ICD-10-CM

## 2015-03-23 NOTE — Progress Notes (Signed)
Patient ID: [name], female   DOB: [birth year], 50 y.o.   MRN: [id number] Pt here today for post op visit. Pt states that she wants to discuss her patch with Dr. Glo Herring.

## 2015-03-23 NOTE — Progress Notes (Signed)
Patient ID: [name], female   DOB: Jan 04, 1966, 50 y.o.   MRN: [id number]    Subjective:  [name] is a 50 y.o. female now 2 weeks status post laparoscopic assisted vaginal hysterectomy.    pt doing well noting loose bm, and some vaginal d/c Review of Systems Negative except lite d/c   Diet:   reg   Bowel movements : normal.  Pain is controlled with current analgesics. Medications being used: ibuprofen (OTC).  Objective:  BP 120/80 mmHg  Ht 5' 4.5" (1.638 m)  Wt 213 lb (96.616 kg)  BMI 36.01 kg/m2  LMP 01/31/2015 General:Well developed, well nourished.  No acute distress. Abdomen: Bowel sounds normal, soft, non-tender. Pelvic Exam:    External Genitalia:  Normal.wwith good healing posteriorly    Vagina: Normal    Cervix: absent cuff smooth    Uterus: absent ""     "    "    Adnexa/Bimanual: Normal  Incision(s):   Healing well, no drainage, no erythema, no hernia, no swelling, no dehiscence,     Assessment:  Post-Op 2 weeks s/p laparoscopic assisted vaginal hysterectomy and bilateral oophorectomy   Stable   Doing well postop4eratively.   Plan:  1.Wound care discussed  May tub bath, drive visit  2. . current medications.estradiol 3. Activity restrictions: no lifting more than 15 pounds 4. return to work: at 6 wk after one more visit. 5. Follow up in  weeks.

## 2015-03-26 ENCOUNTER — Encounter: Payer: 59 | Admitting: Obstetrics and Gynecology

## 2015-04-20 ENCOUNTER — Ambulatory Visit (INDEPENDENT_AMBULATORY_CARE_PROVIDER_SITE_OTHER): Payer: 59 | Admitting: Obstetrics and Gynecology

## 2015-04-20 ENCOUNTER — Encounter: Payer: Self-pay | Admitting: Obstetrics and Gynecology

## 2015-04-20 VITALS — BP 120/84 | Ht 64.5 in | Wt 214.0 lb

## 2015-04-20 DIAGNOSIS — Z90710 Acquired absence of both cervix and uterus: Secondary | ICD-10-CM

## 2015-04-20 DIAGNOSIS — Z9889 Other specified postprocedural states: Secondary | ICD-10-CM

## 2015-04-20 NOTE — Progress Notes (Signed)
Patient ID: [name], female   DOB: 1965-09-18, 50 y.o.   MRN: [id number] Pt here today for post op visit. Pt denies any problems or concerns at this time.

## 2015-04-21 NOTE — Progress Notes (Signed)
Patient ID: [name], female   DOB: [birth year], 50 y.o.   MRN: [id number]    Subjective:  [name] is a 50 y.o. female now 6 weeks status post laparoscopic assisted vaginal hysterectomy and posterior colporrhaphy.    She feels she has done very well, with minimal pain Review of Systems Negative except interested in resuming sex   Diet:   reg   Bowel movements : improved by Post repair.  The patient is not having any pain.  Objective:  BP 120/84 mmHg  Ht 5' 4.5" (1.638 m)  Wt 214 lb (97.07 kg)  BMI 36.18 kg/m2  LMP 01/31/2015 General:Well developed, well nourished.  No acute distress. Abdomen: Bowel sounds normal, soft, non-tender. Pelvic Exam:    External Genitalia:  Normal.    Vagina: Normal    Cervix:absent    Uterus: absent    Adnexa/Bimanual: short vag length, well supported. 2 tiny 2 mm areas of granulation tissue rx AgNO3,   Incision(s):   Healing satisfactorily, no drainage, no erythema, no hernia, no swelling, no dehiscence,     Assessment:  Post-Op 6 weeks s/p laparoscopic assisted vaginal hysterectomy and posterior colporrhaphy   Short functional vag length, pt advised in detail to be cautious with any sex, as deep penetration might disrupt cuff.  Doing well postoperatively.   Plan:  1.Wound care discussed  , esp vag apex. Pt to do self exam to assess vag length 2. . current medications.none 3. Activity restrictions: none 4. return to work: now. 5. Follow up in prn .

## 2015-04-27 ENCOUNTER — Encounter (HOSPITAL_COMMUNITY): Payer: Self-pay | Admitting: Obstetrics and Gynecology

## 2015-04-29 ENCOUNTER — Encounter (HOSPITAL_COMMUNITY): Payer: Self-pay | Admitting: Obstetrics and Gynecology

## 2015-09-20 ENCOUNTER — Other Ambulatory Visit: Payer: Self-pay | Admitting: Family Medicine

## 2015-09-20 DIAGNOSIS — Z8639 Personal history of other endocrine, nutritional and metabolic disease: Secondary | ICD-10-CM

## 2015-09-23 ENCOUNTER — Other Ambulatory Visit: Payer: 59

## 2015-10-01 ENCOUNTER — Ambulatory Visit (INDEPENDENT_AMBULATORY_CARE_PROVIDER_SITE_OTHER): Payer: 59 | Admitting: Family Medicine

## 2015-10-01 ENCOUNTER — Encounter: Payer: Self-pay | Admitting: Family Medicine

## 2015-10-01 VITALS — BP 122/82 | HR 79 | Temp 98.5°F | Ht 65.0 in | Wt 212.0 lb

## 2015-10-01 DIAGNOSIS — Z1211 Encounter for screening for malignant neoplasm of colon: Secondary | ICD-10-CM | POA: Diagnosis not present

## 2015-10-01 DIAGNOSIS — Z0000 Encounter for general adult medical examination without abnormal findings: Secondary | ICD-10-CM | POA: Diagnosis not present

## 2015-10-01 DIAGNOSIS — Z8639 Personal history of other endocrine, nutritional and metabolic disease: Secondary | ICD-10-CM | POA: Diagnosis not present

## 2015-10-01 DIAGNOSIS — R202 Paresthesia of skin: Secondary | ICD-10-CM

## 2015-10-01 LAB — LIPID PANEL
CHOL/HDL RATIO: 4
Cholesterol: 201 mg/dL — ABNORMAL HIGH (ref 0–200)
HDL: 44.8 mg/dL (ref >39.00)
LDL CALC: 138 mg/dL — AB (ref 0–99)
NONHDL: 156.52
Triglycerides: 92 mg/dL (ref 0.0–149.0)
VLDL: 18.4 mg/dL (ref 0.0–40.0)

## 2015-10-01 LAB — COMPREHENSIVE METABOLIC PANEL
ALT: 17 U/L (ref 0–35)
AST: 19 U/L (ref 0–37)
Albumin: 4 g/dL (ref 3.5–5.2)
Alkaline Phosphatase: 80 U/L (ref 39–117)
BILIRUBIN TOTAL: 0.4 mg/dL (ref 0.2–1.2)
BUN: 14 mg/dL (ref 6–23)
CO2: 29 meq/L (ref 19–32)
Calcium: 8.8 mg/dL (ref 8.4–10.5)
Chloride: 106 mEq/L (ref 96–112)
Creatinine, Ser: 0.78 mg/dL (ref 0.40–1.20)
GFR: 100.15 mL/min (ref >60.00)
GLUCOSE: 97 mg/dL (ref 70–99)
POTASSIUM: 4 meq/L (ref 3.5–5.1)
Sodium: 139 mEq/L (ref 135–145)
Total Protein: 7.3 g/dL (ref 6.0–8.3)

## 2015-10-01 NOTE — Progress Notes (Signed)
Pre visit review using our clinic review tool, if applicable. No additional management support is needed unless otherwise documented below in the visit note. 

## 2015-10-01 NOTE — Patient Instructions (Addendum)
Please call about a mammogram.   Let me know when you get a flu shot, I'll update your chart at that.   Go to the lab on the way out.  We'll contact you with your lab report. Let me check with Dr. Marchelle Folks clinic in the meantime.  Take care.  Glad to see you.

## 2015-10-01 NOTE — Progress Notes (Signed)
CPE- See plan.  Routine anticipatory guidance given to patient.  See health maintenance. Tetanus- 2013 Flu- to be done at work.  PNA and shingles not due.  No Pap due to hysterectomy.   Mammogram encouraged.   DXA not due yet.   HRT per gyn.   Living will d/w pt.  Husband designated if patient were incapacitated.   D/w patient [id number] for colon cancer screening, including IFOB vs. colonoscopy.  Risks and benefits of both were discussed and patient voiced understanding.  Pt elects [id number].   HIV likely prev done with prenatal labs ~2003.    Labs pending. D/w pt.  Diet and exercise d/w pt.  Encouraged work on both.   She is avoiding fatty foods and doing better in the meantime.  She had see gen surgery about her gall bladder prev.  No abd pain, no vaginal bleeding.    Still with L sided neuropathy sx, with intermittent numbness in the L arm and L leg w/o weakness.  She has had symptoms for years. Previous notes related to this diagnosis reviewed.  PMH and SH reviewed  Meds, vitals, and allergies reviewed.   ROS: Per HPI.  Unless specifically indicated otherwise in HPI, the patient denies:  General: fever. Eyes: acute vision changes ENT: sore throat Cardiovascular: chest pain Respiratory: SOB GI: vomiting GU: dysuria Musculoskeletal: acute back pain Derm: acute rash Neuro: acute motor dysfunction Psych: worsening mood Endocrine: polydipsia Heme: bleeding Allergy: hayfever  GEN: nad, alert and oriented HEENT: mucous membranes moist NECK: supple w/o LA CV: rrr. PULM: ctab, no inc wob ABD: soft, +bs EXT: no edema SKIN: no acute rash Normal S/S ext x4 at time of exam.

## 2015-10-04 ENCOUNTER — Telehealth: Payer: Self-pay | Admitting: Family Medicine

## 2015-10-04 DIAGNOSIS — Z0000 Encounter for general adult medical examination without abnormal findings: Secondary | ICD-10-CM | POA: Insufficient documentation

## 2015-10-04 NOTE — Telephone Encounter (Signed)
Please contact Dr. Marchelle Folks office. Patient had seen him previously, she still has episodic left arm and leg paresthesias without weakness. She has had previous C-spine and L-spine MRI done. I didn't see Dr. Marchelle Folks notes from his evaluation in our EMR. If Dr. Trenton Gammon believes that the patient has a spinal source for her episodic symptoms, then I would appreciate his input as she is still symptomatic. If he believes that she does not have a spinal source, then it would be reasonable to proceed with MRI of brain and neuro eval. This was discussed with patient her most recent office visit. I will await input from Dr. Trenton Gammon.  Please let me know.  Thanks.

## 2015-10-04 NOTE — Assessment & Plan Note (Signed)
She still reports left-sided symptoms. Normal strength and sensation on extremities at the time of the exam. We had offered and tried to set up neurology referral and MRI of the brain previously. She had canceled or declined. She has seen Dr. Annette Stable previously.  Will ask for his input about the left arm and leg symptoms. She had C-spine and L-spine MRIs done previously. If he thinks that there is not a spinal origin for symptoms, then we can offer to set up MRI of the brain. Discussed with patient.

## 2015-10-04 NOTE — Assessment & Plan Note (Signed)
Tetanus- 2013 Flu- to be done at work.  PNA and shingles not due.  No Pap due to hysterectomy.   Mammogram encouraged.   DXA not due yet.   HRT per gyn.   Living will d/w pt.  Husband designated if patient were incapacitated.   D/w patient [id number] for colon cancer screening, including IFOB vs. colonoscopy.  Risks and benefits of both were discussed and patient voiced understanding.  Pt elects [id number].   HIV likely prev done with prenatal labs ~2003.    Labs pending. D/w pt.  Diet and exercise d/w pt.  Encouraged work on both.   She is avoiding fatty foods and doing better in the meantime.  She had see gen surgery about her gall bladder prev.  No abd pain, no vaginal bleeding.

## 2015-10-05 ENCOUNTER — Encounter: Payer: Self-pay | Admitting: *Deleted

## 2015-10-05 NOTE — Telephone Encounter (Signed)
Placed a call to Dr. Marchelle Folks office asking him to return the call to Dr. Damita Dunnings.  I believe that Dr. Annette Stable returned the call 10/05/15 afternoon but not sure.  Sent staff message to Dr. Damita Dunnings asking.

## 2015-10-06 ENCOUNTER — Telehealth: Payer: Self-pay | Admitting: Family Medicine

## 2015-10-06 DIAGNOSIS — R202 Paresthesia of skin: Secondary | ICD-10-CM

## 2015-10-06 NOTE — Telephone Encounter (Signed)
Please call pt.  I talked to Dr. Annette Stable.  No finding on the prev neck and L spine MRI that would need surgical intervention and no findings to explain per prev sx.  Reasonable to proceed with neuro referral.  I had talked to patient about MRI brain vs referral, but it would likely be better to see neuro first to get their input on imaging first.  I put in the referral.  Thanks.

## 2015-10-06 NOTE — Telephone Encounter (Signed)
Patient notified as instructed by telephone and verbalized understanding. 

## 2015-10-10 IMAGING — US US ABDOMEN COMPLETE
1 series · 14 of 25 positions shown · non-contrast
Comparison: October 12, 2012

CLINICAL DATA: Right upper quadrant pain radiating to the back.

EXAM:
ULTRASOUND ABDOMEN COMPLETE

[Series 1: us abdomen complete · 0.18mm/px · 14 of 140 slices shown]
[im 1/140]
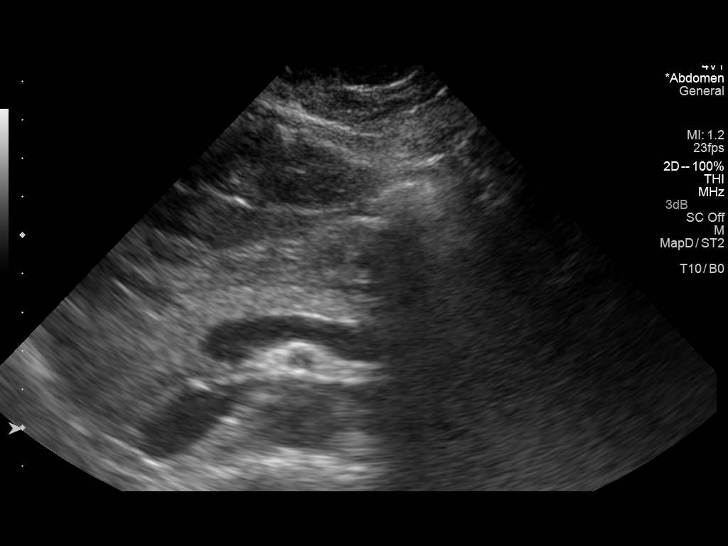
[im 12/140]
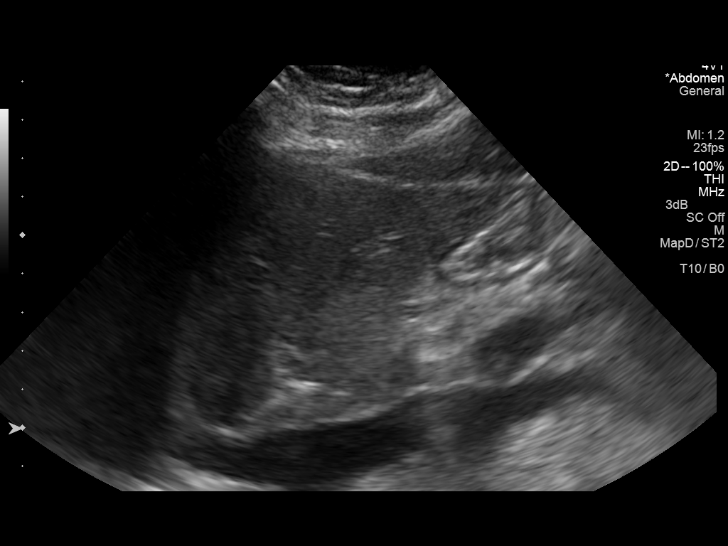
[im 24/140]
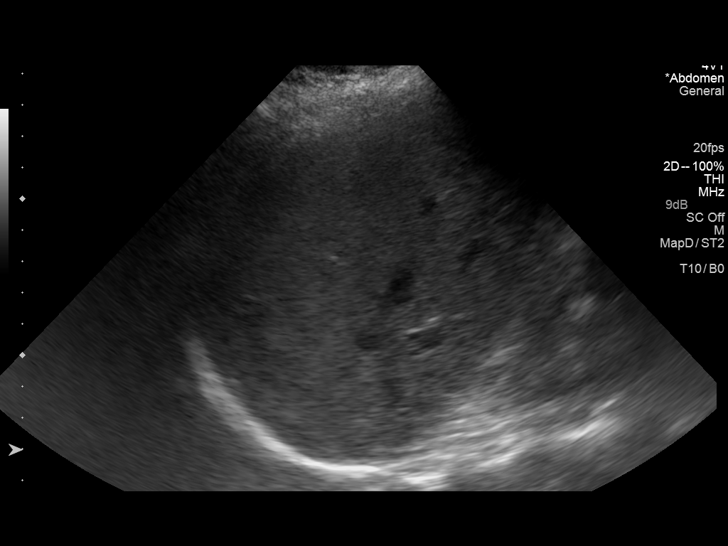
[im 35/140]
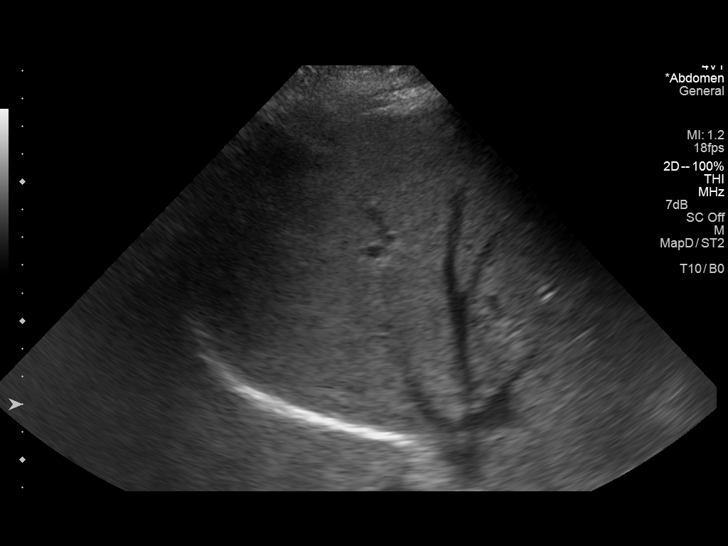
[im 47/140]
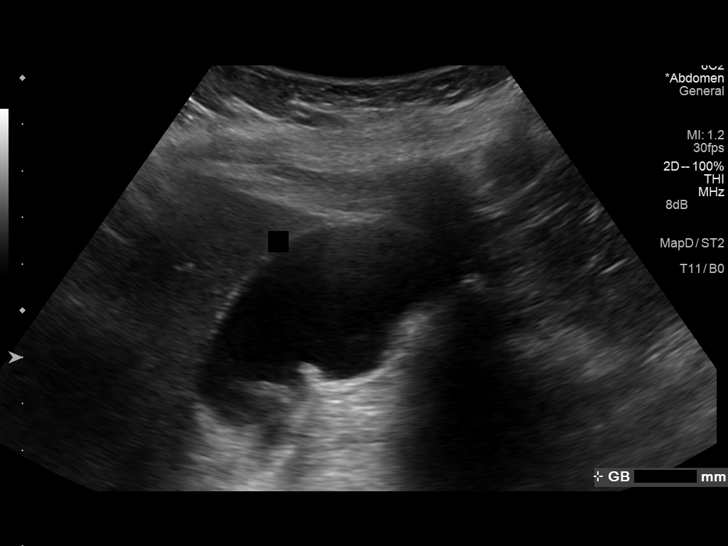
[im 53/140]
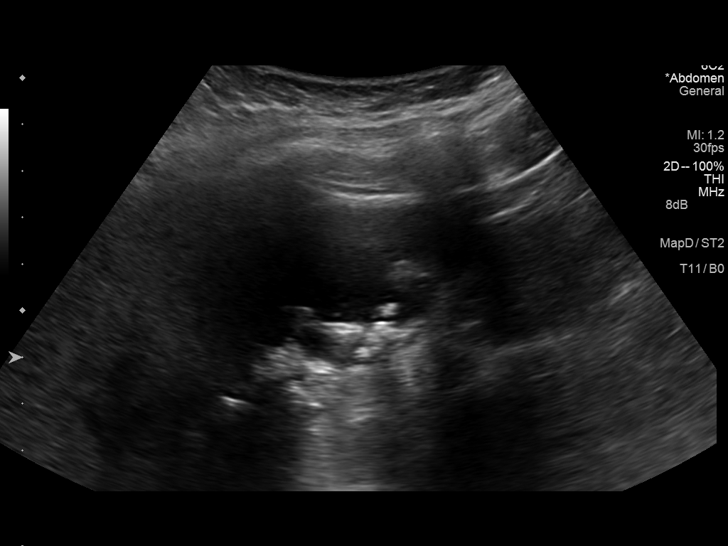
[im 64/140]
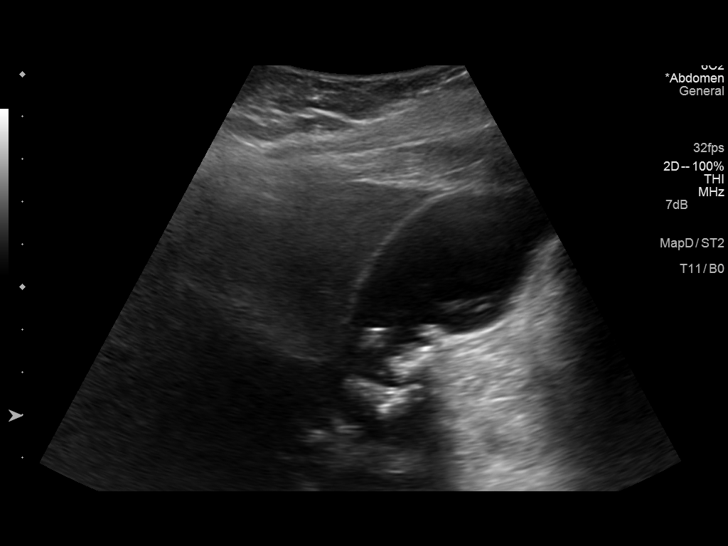
[im 76/140]
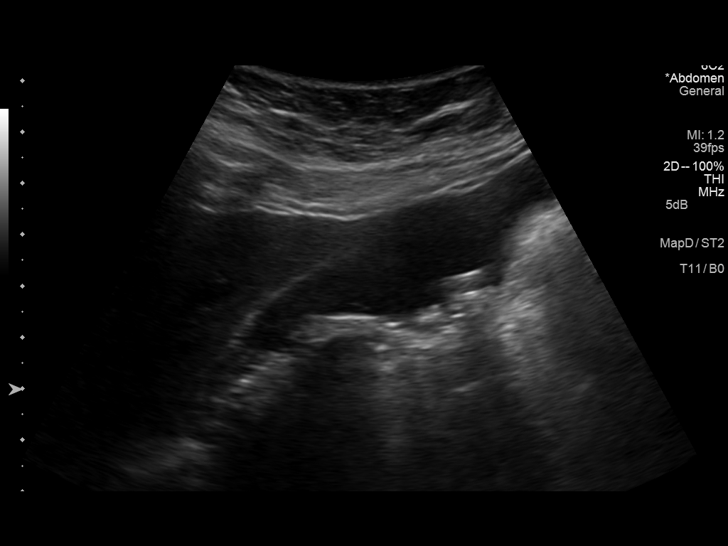
[im 87/140]
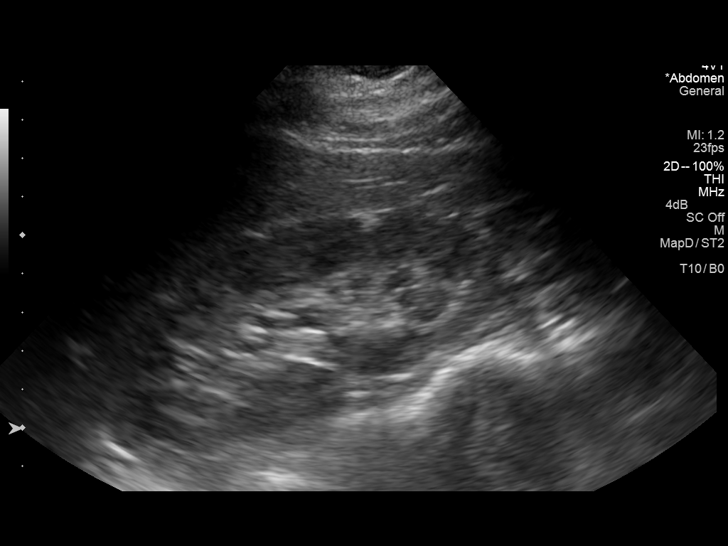
[im 93/140]
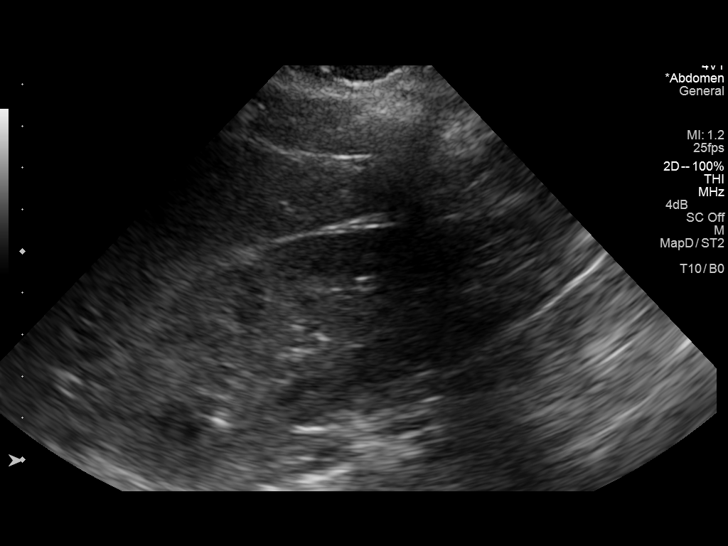
[im 105/140]
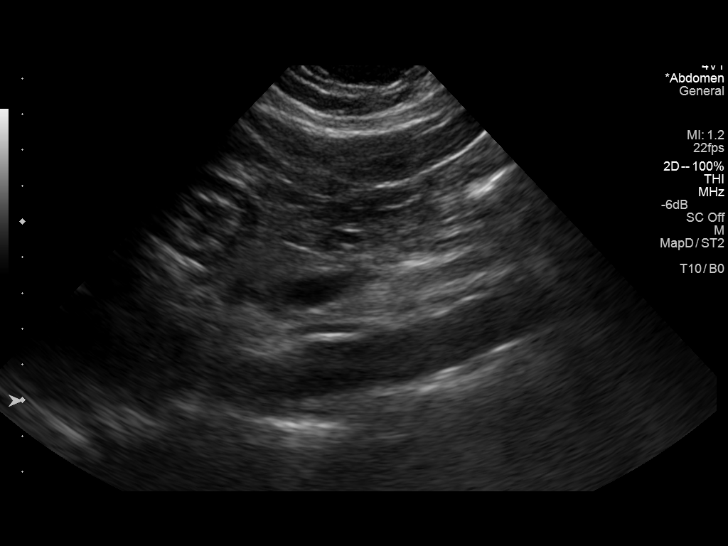
[im 116/140]
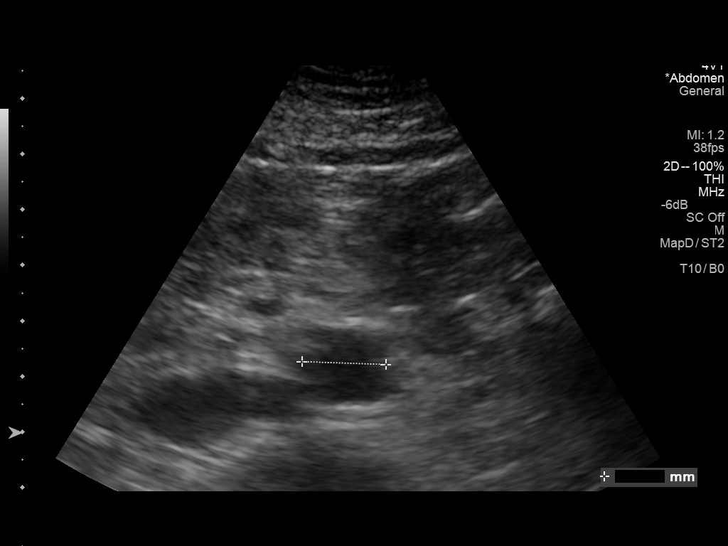
[im 128/140]
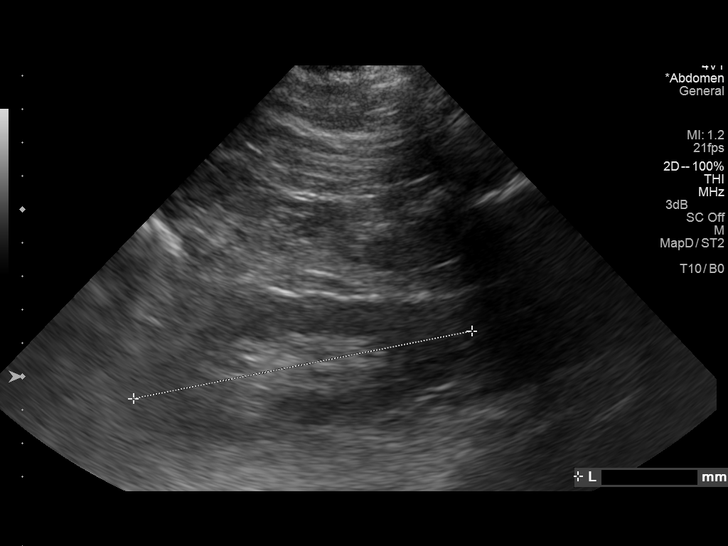
[im 140/140]
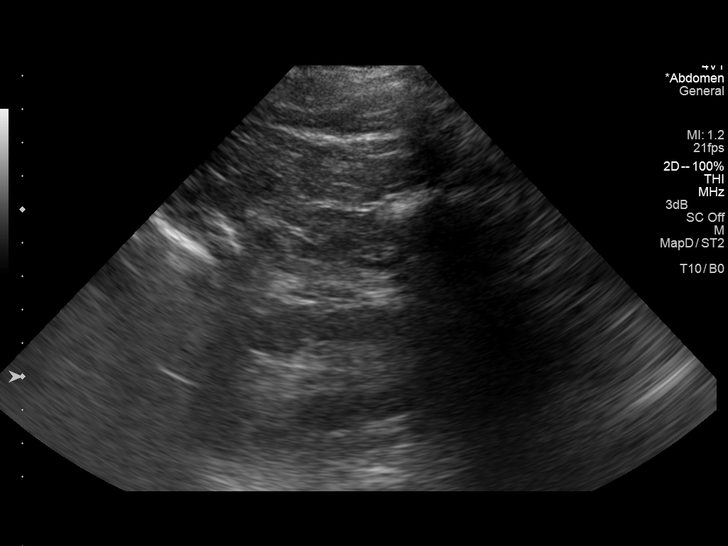

[14 of 25 positions shown; findings below may reference images not displayed]

FINDINGS: Gallbladder: There are gallstones. No wall thickening visualized. No
sonographic Murphy sign noted.

Common bile duct: Diameter: 4.4 mm

Liver: No focal lesion identified. There is mild diffuse increased
echotexture of the liver.

IVC: No abnormality visualized.

Pancreas: Visualized portion unremarkable.

Spleen: Size and appearance within normal limits.

Right Kidney: Length: 11.6 cm. Echogenicity within normal limits. No
mass or hydronephrosis visualized.

Left Kidney: Length: 10.2 cm. Echogenicity within normal limits. No
mass or hydronephrosis visualized.

Abdominal aorta: No aneurysm visualized.

Other findings: None.
IMPRESSION: Cholelithiasis without sonographic evidence of acute cholecystitis.
Mild diffuse increased echotexture of the liver, nonspecific but can
be seen in fatty infiltration of liver.

## 2015-11-15 ENCOUNTER — Encounter: Payer: Self-pay | Admitting: Family Medicine

## 2015-11-19 ENCOUNTER — Ambulatory Visit (INDEPENDENT_AMBULATORY_CARE_PROVIDER_SITE_OTHER): Payer: 59 | Admitting: Family Medicine

## 2015-11-19 ENCOUNTER — Telehealth: Payer: Self-pay | Admitting: Family Medicine

## 2015-11-19 ENCOUNTER — Encounter: Payer: Self-pay | Admitting: Family Medicine

## 2015-11-19 VITALS — BP 122/68 | HR 76 | Temp 98.6°F | Wt 214.5 lb

## 2015-11-19 DIAGNOSIS — R202 Paresthesia of skin: Secondary | ICD-10-CM | POA: Diagnosis not present

## 2015-11-19 DIAGNOSIS — J310 Chronic rhinitis: Secondary | ICD-10-CM

## 2015-11-19 DIAGNOSIS — Z23 Encounter for immunization: Secondary | ICD-10-CM

## 2015-11-19 MED ORDER — FLUTICASONE PROPIONATE 50 MCG/ACT NA SUSP: 2.0000 | Freq: Every day | NASAL | 2 refills | Status: DC

## 2015-11-19 NOTE — Progress Notes (Signed)
Pre visit review using our clinic review tool, if applicable. No additional management support is needed unless otherwise documented below in the visit note. 

## 2015-11-19 NOTE — Progress Notes (Signed)
D/w pt about neuro referral prev.  We called her to set up.  Letter was sent to patient. Per patient, she has called [facility] but per patient couldn't get though.  She talked to [place] today on the way into the appointment.  She still has L sided tingling at baseline.   All of this was discussed at [place] in detail.    Prev labs d/w pt, reviewed with patient.    She has daily feeling of pressure in the eyes when waking from sleep.  Will last for a few hours.  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  Started taking pseudophed and that helps; sx return when stopping med.  She has chronic rhinitis, ongoing for years.  Worse in the last year.  She'll have blurry vision when she has sx.  She doesn't have eye discharge.  No eyelid droop.  No double vision.  No vision loss.  Has reading glasses but no other correction.  Later in her workday her vision will improve.  No FCNAVD.  No ear pain.  Some ST, likely from post nasal gtt per patient report.  Due for eye exam.    She has GERD and occ trouble with swallowing.  No sticking of foods, but sometimes has the sensation that liquids don't go down well.  That happens about once a month.  No vomiting.    I encouraged her to see neuro.    Meds, vitals, and allergies reviewed.   ROS: Per HPI unless specifically indicated in ROS section   GEN: nad, alert and oriented HEENT: mucous membranes moist, tm w/o erythema, nasal exam w/o erythema, clear discharge noted,  OP without cobblestoning, sinuses ttp x 4 NECK: supple w/o LA CV: rrr.   PULM: ctab, no inc wob EXT: no edema SKIN: no acute rash

## 2015-11-19 NOTE — Telephone Encounter (Signed)
I cannot control the scheduling with another doc.  She can call to get on the cancellation list.

## 2015-11-19 NOTE — Patient Instructions (Addendum)
Talk to Rosaria Ferries or [place] on the way out.   Take care.  Glad to see you.  Add on flonase in the meantime.  Update me if not better in about 10 days or if worse in the meantime.

## 2015-11-19 NOTE — Telephone Encounter (Signed)
Pt is scheduled with dr patel 02/16/2016.  She wanted to know if you could message dr patel to get her seen sooner

## 2015-11-21 NOTE — Assessment & Plan Note (Signed)
She does not appear to be acutely ill. Discussed with patient about restarting Flonase and then update me as needed. We can refer to ENT if needed. Nontoxic. It may be that some of her postnasal drip could be contributing to GERD and changes with swallowing. She can update [place] if this is not improved with Flonase. Okay for outpatient f/u.

## 2015-11-21 NOTE — Assessment & Plan Note (Signed)
We have made multiple referrals on her behalf over the years. She needs neurology evaluation. She has never gone through with neurology referral. I told her that I hope she does not have an ominous process, however we will not know what is going on until she has neurology evaluation. I have not delayed process. She has canceled or delayed multiple referrals. We have tried to contact her multiple times, through multiple sources. She says that she has not gotten a copy of her labs previously, in spite of the fact we mailed a letter with her labs to her. I don't really have much to offer her other than the previous good advice to go see neurology. Then she asked me if I could prescribe her medication for restless leg symptoms. She says she was treated for it previously but could not tolerate the medication due to sedation. I told her to go see neurology, as she has been instructed to do many times in the past.  >25 minutes spent in face to face time with patient, >50% spent in counselling or coordination of care

## 2015-11-23 NOTE — Telephone Encounter (Signed)
Pt is on cancellation list at [facility]

## 2015-12-01 ENCOUNTER — Telehealth: Payer: Self-pay

## 2015-12-01 NOTE — Telephone Encounter (Signed)
Pt request copy of immunization record with pts [facility] ID # [id number] on record. Pt will pick up at front desk.

## 2016-01-27 ENCOUNTER — Telehealth: Payer: 59 | Admitting: Physician Assistant

## 2016-01-27 DIAGNOSIS — J329 Chronic sinusitis, unspecified: Secondary | ICD-10-CM

## 2016-01-27 DIAGNOSIS — B9789 Other viral agents as the cause of diseases classified elsewhere: Secondary | ICD-10-CM | POA: Diagnosis not present

## 2016-01-27 MED ORDER — AZELASTINE HCL 0.1 % NA SOLN: 2.0000 | Freq: Two times a day (BID) | NASAL | 12 refills | Status: DC

## 2016-01-27 MED ORDER — BENZONATATE 100 MG PO CAPS: 100.0000 mg | ORAL_CAPSULE | Freq: Two times a day (BID) | ORAL | 0 refills | Status: DC | PRN

## 2016-01-27 NOTE — Addendum Note (Signed)
Addended by: Raiford Noble on: 01/27/2016 04:06 PM   Modules accepted: Orders

## 2016-01-27 NOTE — Progress Notes (Signed)
We are sorry that you are not feeling well.  Here is how we plan to help!  Based on what you have shared with me it looks like you have sinusitis.  Sinusitis is inflammation and infection in the sinus cavities of the head.  Based on your presentation I believe you most likely have Acute Viral Sinusitis.This is an infection most likely caused by a virus. There is not specific treatment for viral sinusitis other than to help you with the symptoms until the infection runs its course.  You may use an oral decongestant such as Mucinex D or if you have glaucoma or high blood pressure use plain Mucinex. Saline nasal spray help and can safely be used as often as needed for congestion, I have prescribed: Fluticasone nasal spray two sprays in each nostril twice a day and Azelastine nasal spray 2 sprays in each nostril twice a day  Some authorities believe that zinc sprays or the use of Echinacea may shorten the course of your symptoms.  Sinus infections are not as easily transmitted as other respiratory infection, however we still recommend that you avoid close contact with loved ones, especially the very young and elderly.  Remember to wash your hands thoroughly throughout the day as this is the number one way to prevent the spread of infection!  Home Care:  Only take medications as instructed by your medical team.  Complete the entire course of an antibiotic.  Do not take these medications with alcohol.  A steam or ultrasonic humidifier can help congestion.  You can place a towel over your head and breathe in the steam from hot water coming from a faucet.  Avoid close contacts especially the very young and the elderly.  Cover your mouth when you cough or sneeze.  Always remember to wash your hands.  Get Help Right Away If:  You develop worsening fever or sinus pain.  You develop a severe head ache or visual changes.  Your symptoms persist after you have completed your treatment plan.  Make  sure you  Understand these instructions.  Will watch your condition.  Will get help right away if you are not doing well or get worse.  Your e-visit answers were reviewed by a board certified advanced clinical practitioner to complete your personal care plan.  Depending on the condition, your plan could have included both over the counter or prescription medications.  If there is a problem please reply  once you have received a response from your provider.  Your safety is important to [place].  If you have drug allergies check your prescription carefully.    You can use MyChart to ask questions about today's visit, request a non-urgent call back, or ask for a work or school excuse for 24 hours related to this e-Visit. If it has been greater than 24 hours you will need to follow up with your provider, or enter a new e-Visit to address those concerns.  You will get an e-mail in the next two days asking about your experience.  I hope that your e-visit has been valuable and will speed your recovery. Thank you for using e-visits.

## 2016-02-02 DIAGNOSIS — H16223 Keratoconjunctivitis sicca, not specified as Sjogren's, bilateral: Secondary | ICD-10-CM | POA: Diagnosis not present

## 2016-02-02 DIAGNOSIS — H5713 Ocular pain, bilateral: Secondary | ICD-10-CM | POA: Diagnosis not present

## 2016-02-11 ENCOUNTER — Telehealth: Payer: Self-pay | Admitting: Family Medicine
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
## 2016-02-14 NOTE — Telephone Encounter (Signed)
Will await ER input.

## 2016-02-16 ENCOUNTER — Ambulatory Visit: Payer: 59 | Admitting: Neurology

## 2016-02-18 ENCOUNTER — Ambulatory Visit (INDEPENDENT_AMBULATORY_CARE_PROVIDER_SITE_OTHER): Payer: 59 | Admitting: Primary Care

## 2016-02-18 ENCOUNTER — Encounter: Payer: Self-pay | Admitting: Primary Care

## 2016-02-18 VITALS — BP 114/70 | HR 84 | Temp 98.4°F | Ht 64.5 in | Wt 212.8 lb

## 2016-02-18 DIAGNOSIS — J029 Acute pharyngitis, unspecified: Secondary | ICD-10-CM

## 2016-02-18 LAB — POCT RAPID STREP A (OFFICE): RAPID STREP A SCREEN: NEGATIVE

## 2016-02-18 NOTE — Progress Notes (Signed)
Pre visit review using our clinic review tool, if applicable. No additional management support is needed unless otherwise documented below in the visit note. 

## 2016-02-18 NOTE — Patient Instructions (Signed)
We will notify you of your throat culture once received.  Continue ibuprofen or tylenol for pain/inflammation. Warm salt gargles are also recommended.  I do recommend an antihistamine for any consistent throat drainage as this could very well be contributing.  We will be in touch with you early next week.  It was a pleasure meeting you!

## 2016-02-18 NOTE — Addendum Note (Signed)
Addended by: Jacqualin Combes on: 02/18/2016 12:24 PM   Modules accepted: Orders

## 2016-02-18 NOTE — Progress Notes (Signed)
Subjective:    Patient ID: [name], female    DOB: [birth year], 51 y.o.   MRN: [id number]  HPI  [name] is a 51 year old female who presents today with a chief complaint of sore throat. She also reports chills, night sweats, nasal congestion, sinus presusre. Her symptoms began Tuesday night this week. Her nasal congestion is chronic. She denies cough and has not checked her temperature. She say her optometrist in early January and was prescribed an eye drop for bacterial infection. She's taken ibuprofen, used Flonase, and gargled warm salt water. She has noticed her symptoms of sore throat intermittently for the past 6 months.  Review of Systems  Constitutional: Positive for chills and fatigue. Negative for fever.  HENT: Positive for congestion, sinus pressure and sore throat. Negative for ear pain.   Respiratory: Negative for cough and shortness of breath.   Cardiovascular: Negative for chest pain.       Past Medical History:  Diagnosis Date  . Abdominal pain, other specified site   . Anemia   . Anxiety state, unspecified   . GERD (gastroesophageal reflux disease)   . Headache(784.0)   . Other constipation   . Other specified cardiac dysrhythmias(427.89)    RBBB  . Other symptoms involving digestive system(787.99)      Social History   Social History  . Marital status: Married    Spouse name: [name]  . Number of children: 4  . Years of education: 14   Occupational History  . [employer]    [place] History Main Topics  . Smoking status: Never Smoker  . Smokeless tobacco: Never Used  . Alcohol use No  . Drug use: No  . Sexual activity: Not Currently    Birth control/ protection: Surgical   Other Topics Concern  . Not on file   Social History Narrative   Patient lives with her husband [name] ) and her children.   Patient has four children.   Patient works full-time.   Patient has a  college education.   Patient is right-handed.   Patient drinks caffeine- once or twice a week.    Past Surgical History:  Procedure Laterality Date  . CARDIAC CATHETERIZATION  12/09/03   Normal  . CERVICAL DISC SURGERY     Ruptured disc repair / and approach c5/6, c6/7; plate  . HERNIA REPAIR     umbilical  . LAPAROSCOPIC ASSISTED VAGINAL HYSTERECTOMY N/A 03/09/2015   Procedure: LAPAROSCOPIC ASSISTED VAGINAL HYSTERECTOMY;  Surgeon: Jonnie Kind, MD;  Location: [facility];  Service: Gynecology;  Laterality: N/A;  . RECTOCELE REPAIR N/A 03/09/2015   Procedure: POSTERIOR REPAIR (RECTOCELE);  Surgeon: Jonnie Kind, MD;  Location: [facility];  Service: Gynecology;  Laterality: N/A;  . SALPINGOOPHORECTOMY Bilateral 03/09/2015   Procedure: SALPINGO OOPHORECTOMY;  Surgeon: Jonnie Kind, MD;  Location: [facility];  Service: Gynecology;  Laterality: Bilateral;  . TUBAL LIGATION    . VAGINAL DELIVERY  90, 97, 99, 03   x 4     Family History  Problem Relation Age of Onset  . Deep vein thrombosis Mother     cerebral blood clot   . Heart failure Father   . Diabetes Maternal Grandmother   . Stroke Maternal Grandmother   . Cancer Maternal Grandfather     Prostate   . Depression Maternal Uncle   . Hypertension Neg Hx   .  Colon cancer Neg Hx   . Breast cancer Neg Hx     Allergies  Allergen Reactions  . Sulfonamide Derivatives Rash    Current Outpatient Prescriptions on File Prior to Visit  Medication Sig Dispense Refill  . azelastine (ASTELIN) 0.1 % nasal spray Place 2 sprays into both nostrils 2 (two) times daily. Use in each nostril as directed 30 mL 12  . estradiol (CLIMARA - DOSED IN MG/24 HR) 0.1 mg/24hr patch Place 1 patch (0.1 mg total) onto the skin once a week. 4 patch 12   No current facility-administered medications on file prior to visit.     BP 114/70   Pulse 84   Temp 98.4 F (36.9 C) (Oral)   Ht 5' 4.5" (1.638 m)   Wt 212 lb 12.8 oz (96.5 kg)   LMP 01/31/2015   SpO2 98%    BMI 35.96 kg/m    Objective:   Physical Exam  Constitutional: She appears well-nourished.  HENT:  Right Ear: Tympanic membrane and ear canal normal.  Left Ear: Tympanic membrane and ear canal normal.  Nose: Right sinus exhibits no maxillary sinus tenderness and no frontal sinus tenderness. Left sinus exhibits no maxillary sinus tenderness and no frontal sinus tenderness.  Mouth/Throat: Oropharynx is clear and moist.  Eyes: Conjunctivae are normal.  Neck: Neck supple.  Cardiovascular: Normal rate and regular rhythm.   Pulmonary/Chest: Effort normal and breath sounds normal. She has no wheezes. She has no rales.  Lymphadenopathy:    She has no cervical adenopathy.  Skin: Skin is warm and dry.          Assessment & Plan:  Sore Throat:  Present with difficulty swallowing x 3 days. No cough, fevers, sick contacts. Exam today with mild erythema, not suspicious for strep. Does not appear acutely ill. Rapid Strep: Negative. Suspect viral involvement and will treat with supportive measures. Patient upset that she will not be receiving antibiotics today as her exam and presentation do not suggest this. Will send throat culture to be sure. Recommended ibuprofen, warm salt gargles, daily antihistamine for post nasal drip. Could be GERD, no mention of symptoms. If culture negative would recommend short term PPI.  Sheral Flow, NP

## 2016-02-20 LAB — CULTURE, GROUP A STREP: ORGANISM ID, BACTERIA: NORMAL

## 2016-02-29 ENCOUNTER — Telehealth: Payer: Self-pay | Admitting: Family Medicine

## 2016-02-29 ENCOUNTER — Encounter: Payer: Self-pay | Admitting: Primary Care

## 2016-02-29 NOTE — Telephone Encounter (Signed)
No issue needed to address. I just saw her for a sore throat.

## 2016-02-29 NOTE — Telephone Encounter (Signed)
See mychart message. I don't know the context with this.  Please let me know what is going on.  Thanks.

## 2016-02-29 NOTE — Telephone Encounter (Signed)
Thanks

## 2016-03-11 ENCOUNTER — Other Ambulatory Visit: Payer: Self-pay | Admitting: Obstetrics and Gynecology

## 2016-03-15 NOTE — Telephone Encounter (Signed)
refil estradiol patch x 2 months. Needs f/u appt

## 2016-04-14 DIAGNOSIS — M25562 Pain in left knee: Secondary | ICD-10-CM | POA: Diagnosis not present

## 2016-04-17 ENCOUNTER — Ambulatory Visit: Payer: 59 | Admitting: Neurology

## 2016-04-21 ENCOUNTER — Other Ambulatory Visit (INDEPENDENT_AMBULATORY_CARE_PROVIDER_SITE_OTHER): Payer: 59

## 2016-04-21 ENCOUNTER — Encounter: Payer: Self-pay | Admitting: Neurology

## 2016-04-21 ENCOUNTER — Ambulatory Visit (INDEPENDENT_AMBULATORY_CARE_PROVIDER_SITE_OTHER): Payer: 59 | Admitting: Neurology

## 2016-04-21 VITALS — BP 110/80 | HR 80 | Ht 64.5 in | Wt 215.5 lb

## 2016-04-21 DIAGNOSIS — R200 Anesthesia of skin: Secondary | ICD-10-CM

## 2016-04-21 LAB — VITAMIN B12: Vitamin B-12: 392 pg/mL (ref 211–911)

## 2016-04-21 LAB — SEDIMENTATION RATE: SED RATE: 15 mm/h (ref 0–30)

## 2016-04-21 LAB — VITAMIN D 25 HYDROXY (VIT D DEFICIENCY, FRACTURES): VITD: 15.41 ng/mL — ABNORMAL LOW (ref 30.00–100.00)

## 2016-04-21 LAB — TSH: TSH: 0.72 u[IU]/mL (ref 0.35–4.50)

## 2016-04-21 NOTE — Patient Instructions (Addendum)
1.  Check labs 2.  MRI brain wwo contrast  We will call you with the results of your testing

## 2016-04-21 NOTE — Progress Notes (Signed)
[facility] Clinic Note - Initial Visit   Date: 04/21/16  [name] MRN: [id number] DOB: [birth year]   Dear Dr. Damita Dunnings:  Thank you for your kind referral of [name] for consultation of left sided numbness. Although her history is well known to you, please allow [place] to reiterate it for the purpose of our medical record. The patient was accompanied to the clinic by self.    History of Present Illness: [name] is a 51 y.o. right-handed African American female with s/p cervical fusion C5-7 presenting for evaluation of left sided paresthesias.    Starting around 2013, she has numbness involving her face, arms, and legs.  Symptoms occur mostly when sleeping and are improved by repositioning.  She does not know duration of spells, but it occurs about 3 times per week.  She does have any identifiable triggers.  She feels that over the years, she has more weakness involving the arm and leg.  None of her symptoms are constant.  In 2014, she was evaluated by Dr. Krista Blue for these symptoms as well as headaches and arthralgias, and recommended to have MRI brain, but did not follow-up.    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  She sleeps about 5 hours/night and does not feel rested. She is the primary caregiver to her family, children, and 53 year old mother who live an hour away, so does endorse being stressed and overwhelmed.  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   Out-side paper records, electronic medical record, and images have been reviewed where available and summarized as:  MRI lumbar spine 07/15/2014: 1. Focally advanced facet arthropathy on the right at L4-5 with joint effusion and posterior synovial cyst. 2. Progressive L5-S1 degenerative disc disease since 2009. An associated left paracentral disc protrusion remains stable and small. 3. No nerve impingement.  MRI cervical  spine 07/15/2014: Status post C5-C7 fusion without residual impingement at those levels. Disc Pathology at C3-4 and C4-5 as well as C7-T1 could represent components of adjacent segment disease. There is however no significant spinal stenosis or cord compression.  Lab Results  Component Value Date   TSH 1.33 10/08/2012   Lab Results  Component Value Date   HGBA1C 5.5 05/14/2014     Past Medical History:  Diagnosis Date  . Abdominal pain, other specified site   . Anemia   . Anxiety state, unspecified   . GERD (gastroesophageal reflux disease)   . Headache(784.0)   . Other constipation   . Other specified cardiac dysrhythmias(427.89)    RBBB  . Other symptoms involving digestive system(787.99)     Past Surgical History:  Procedure Laterality Date  . CARDIAC CATHETERIZATION  12/09/03   Normal  . CERVICAL DISC SURGERY     Ruptured disc repair / and approach c5/6, c6/7; plate  . HERNIA REPAIR     umbilical  . LAPAROSCOPIC ASSISTED VAGINAL HYSTERECTOMY N/A 03/09/2015   Procedure: LAPAROSCOPIC ASSISTED VAGINAL HYSTERECTOMY;  Surgeon: Jonnie Kind, MD;  Location: [facility];  Service: Gynecology;  Laterality: N/A;  . RECTOCELE REPAIR N/A 03/09/2015   Procedure: POSTERIOR REPAIR (RECTOCELE);  Surgeon: Jonnie Kind, MD;  Location: [facility];  Service: Gynecology;  Laterality: N/A;  . SALPINGOOPHORECTOMY Bilateral 03/09/2015   Procedure: SALPINGO OOPHORECTOMY;  Surgeon: Jonnie Kind, MD;  Location: [facility];  Service: Gynecology;  Laterality: Bilateral;  . TUBAL LIGATION    . VAGINAL DELIVERY  90,  97, 99, 03   x 4      Medications:  Outpatient Encounter Prescriptions as of 04/21/2016  Medication Sig  . azelastine (ASTELIN) 0.1 % nasal spray Place 2 sprays into both nostrils 2 (two) times daily. Use in each nostril as directed  . estradiol (CLIMARA - DOSED IN MG/24 HR) 0.1 mg/24hr patch PLACE 1 PATCH ONTO THE SKIN ONCE A WEEK  . RESTASIS MULTIDOSE 0.05 % ophthalmic emulsion PLACE 1 DROP  INTO BOTH EYES TWICE A DAY   No facility-administered encounter medications on file as of 04/21/2016.      Allergies:  Allergies  Allergen Reactions  . Sulfonamide Derivatives Rash    Family History: Family History  Problem Relation Age of Onset  . Deep vein thrombosis Mother     cerebral blood clot   . Heart failure Father   . Diabetes Maternal Grandmother   . Stroke Maternal Grandmother   . Cancer Maternal Grandfather     Prostate   . Depression Maternal Uncle   . Hypertension Neg Hx   . Colon cancer Neg Hx   . Breast cancer Neg Hx     Social History: Social History  Substance Use Topics  . Smoking status: Never Smoker  . Smokeless tobacco: Never Used  . Alcohol use No   Social History   Social History Narrative   Patient lives with her husband [name] ) and her children.   Patient has four children.   Patient works full-time.   Patient has a college education.   Patient is right-handed.   Patient drinks caffeine- once or twice a week.    Review of Systems:  CONSTITUTIONAL: No fevers, chills, night sweats, or weight loss.   EYES: No visual changes or eye pain ENT: No hearing changes.  No history of nose bleeds.   RESPIRATORY: No cough, wheezing and shortness of breath.   CARDIOVASCULAR: Negative for chest pain, and palpitations.   GI: Negative for abdominal discomfort, blood in stools or black stools.  No recent change in bowel habits.   GU:  No history of incontinence.   MUSCLOSKELETAL: No history of joint pain or swelling.  No myalgias.   SKIN: Negative for lesions, rash, and itching.   HEMATOLOGY/ONCOLOGY: Negative for prolonged bleeding, bruising easily, and swollen nodes.  No history of cancer.   ENDOCRINE: Negative for cold or heat intolerance, polydipsia or goiter.   PSYCH:  No depression or anxiety symptoms.   NEURO: As Above.   Vital Signs:  BP 110/80   Pulse 80   Ht 5' 4.5" (1.638 m)   Wt 215 lb 8 oz (97.8 kg)   LMP 01/31/2015   SpO2  98%   BMI 36.42 kg/m    General Medical Exam:   General:  Well appearing, comfortable.   Eyes/ENT: see cranial nerve examination.   Neck: No masses appreciated.  Full range of motion without tenderness.  No carotid bruits. Respiratory:  Clear to auscultation, good air entry bilaterally.   Cardiac:  Regular rate and rhythm, no murmur.   Extremities:  No deformities, edema, or skin discoloration.  Skin:  No rashes or lesions.  Neurological Exam: MENTAL STATUS including orientation to time, place, person, recent and remote memory, attention span and concentration, language, and fund of knowledge is normal.  Speech is not dysarthric.  CRANIAL NERVES: II:  No visual field defects.  Unremarkable fundi.   III-IV-VI: Pupils equal round and reactive to light.  Normal conjugate, extra-ocular eye movements in all directions  of gaze.  No nystagmus.  No ptosis.   V:  Normal facial sensation.    VII:  Normal facial symmetry and movements.  No pathologic facial reflexes.  VIII:  Normal hearing and vestibular function.   IX-X:  Normal palatal movement.   XI:  Normal shoulder shrug and head rotation.   XII:  Normal tongue strength and range of motion, no deviation or fasciculation.  MOTOR: Motor strength is 5/5 throughout. No atrophy, fasciculations or abnormal movements.  No pronator drift.  Tone is normal.    MSRs:  Reflexes are brisk 2+/4 throughout.  Plantars are down going.  SENSORY:  Normal and symmetric perception of light touch, pinprick, vibration, and proprioception.  Romberg's sign absent.   COORDINATION/GAIT: Normal finger-to- nose-finger and heel-to-shin.  Intact rapid alternating movements bilaterally.  Able to rise from a chair without using arms.  Gait narrow based and stable. Tandem and stressed gait intact.    IMPRESSION: Ms. [name] is a delightful 51 year-old female referred for evaluation of transient spells of left hemisensory changes.  Her exam is entirely normal and  non-focal.  Given that symptoms are predictable and only occur when she is sleeping, this unlikely represents anything worrisome and may be a manifestation of stress. However, given her age and lateralizing symptoms, MRI brain wwo contrast will be ordered to evaluate for demyelinating disease.  Laboratory testing will include Check ESR, vitamin B12, vitamin D, copper, TSH.  Further recommendations will be based on the results of her testing.  The duration of this appointment visit was 45 minutes of face-to-face time with the patient.  Greater than 50% of this time was spent in counseling, explanation of diagnosis, planning of further management, and coordination of care.   Thank you for allowing me to participate in patient's care.  If I can answer any additional questions, I would be pleased to do so.    Sincerely,    Anuel Sitter K. Posey Pronto, DO

## 2016-04-23 LAB — COPPER, SERUM: COPPER: 99 ug/dL (ref 70–175)

## 2016-04-24 ENCOUNTER — Telehealth: Payer: Self-pay | Admitting: *Deleted

## 2016-04-24 NOTE — Telephone Encounter (Signed)
-----   Message from Alda Berthold, DO sent at 04/24/2016  9:30 AM EDT ----- Please notify patient lab are within normal limits, except vitamin D is low.  Start vitamin D 1000 units daily.  Thank you.

## 2016-04-24 NOTE — Telephone Encounter (Signed)
Patient given results and instructions.   

## 2016-05-18 ENCOUNTER — Ambulatory Visit
Admission: RE | Admit: 2016-05-18 | Discharge: 2016-05-18 | Disposition: A | Discharge status: Home / Self Care | Payer: 59 | Source: Ambulatory Visit | Attending: Neurology | Admitting: Neurology

## 2016-05-18 DIAGNOSIS — R200 Anesthesia of skin: Secondary | ICD-10-CM | POA: Diagnosis not present

## 2016-05-18 MED ORDER — GADOBENATE DIMEGLUMINE 529 MG/ML IV SOLN: 19.0000 mL | Freq: Once | INTRAVENOUS | Status: DC | PRN

## 2016-05-21 ENCOUNTER — Other Ambulatory Visit: Payer: Self-pay | Admitting: Obstetrics and Gynecology

## 2016-05-21 MED ORDER — ESTRADIOL 0.1 MG/24HR TD PTWK: MEDICATED_PATCH | TRANSDERMAL | 11 refills | Status: DC

## 2016-05-21 NOTE — Progress Notes (Signed)
Climara refilled x 1 yr

## 2016-05-22 ENCOUNTER — Encounter: Payer: Self-pay | Admitting: Neurology

## 2016-05-24 ENCOUNTER — Other Ambulatory Visit: Payer: Self-pay | Admitting: *Deleted

## 2016-05-24 ENCOUNTER — Encounter: Payer: Self-pay | Admitting: *Deleted

## 2016-05-24 DIAGNOSIS — F439 Reaction to severe stress, unspecified: Secondary | ICD-10-CM

## 2016-06-27 LAB — HM DIABETES EYE EXAM

## 2016-07-31 ENCOUNTER — Encounter: Payer: Self-pay | Admitting: Family Medicine

## 2016-11-06 ENCOUNTER — Ambulatory Visit: Payer: Self-pay | Admitting: Family Medicine

## 2016-12-06 ENCOUNTER — Other Ambulatory Visit: Payer: Self-pay | Admitting: Family Medicine

## 2016-12-06 DIAGNOSIS — B9789 Other viral agents as the cause of diseases classified elsewhere: Secondary | ICD-10-CM

## 2016-12-06 DIAGNOSIS — J329 Chronic sinusitis, unspecified: Secondary | ICD-10-CM

## 2016-12-06 DIAGNOSIS — E559 Vitamin D deficiency, unspecified: Secondary | ICD-10-CM

## 2016-12-06 DIAGNOSIS — E785 Hyperlipidemia, unspecified: Secondary | ICD-10-CM

## 2016-12-15 ENCOUNTER — Other Ambulatory Visit (INDEPENDENT_AMBULATORY_CARE_PROVIDER_SITE_OTHER): Payer: Self-pay

## 2016-12-15 DIAGNOSIS — E785 Hyperlipidemia, unspecified: Secondary | ICD-10-CM

## 2016-12-15 DIAGNOSIS — E559 Vitamin D deficiency, unspecified: Secondary | ICD-10-CM

## 2016-12-15 LAB — LIPID PANEL
Cholesterol: 227 mg/dL — ABNORMAL HIGH (ref 0–200)
HDL: 49.6 mg/dL (ref >39.00)
LDL Cholesterol: 161 mg/dL — ABNORMAL HIGH (ref 0–99)
NONHDL: 177
Total CHOL/HDL Ratio: 5
Triglycerides: 81 mg/dL (ref 0.0–149.0)
VLDL: 16.2 mg/dL (ref 0.0–40.0)

## 2016-12-15 LAB — COMPREHENSIVE METABOLIC PANEL
ALK PHOS: 78 U/L (ref 39–117)
ALT: 27 U/L (ref 0–35)
AST: 24 U/L (ref 0–37)
Albumin: 4 g/dL (ref 3.5–5.2)
BILIRUBIN TOTAL: 0.5 mg/dL (ref 0.2–1.2)
BUN: 13 mg/dL (ref 6–23)
CO2: 28 mEq/L (ref 19–32)
Calcium: 9.4 mg/dL (ref 8.4–10.5)
Chloride: 106 mEq/L (ref 96–112)
Creatinine, Ser: 0.77 mg/dL (ref 0.40–1.20)
GFR: 101.17 mL/min (ref >60.00)
GLUCOSE: 98 mg/dL (ref 70–99)
Potassium: 3.7 mEq/L (ref 3.5–5.1)
SODIUM: 140 meq/L (ref 135–145)
TOTAL PROTEIN: 7.2 g/dL (ref 6.0–8.3)

## 2016-12-15 LAB — VITAMIN D 25 HYDROXY (VIT D DEFICIENCY, FRACTURES): VITD: 25.17 ng/mL — AB (ref 30.00–100.00)

## 2016-12-29 ENCOUNTER — Encounter: Payer: Self-pay | Admitting: Family Medicine

## 2016-12-29 ENCOUNTER — Ambulatory Visit (INDEPENDENT_AMBULATORY_CARE_PROVIDER_SITE_OTHER): Payer: 59 | Admitting: Family Medicine

## 2016-12-29 VITALS — BP 110/70 | HR 68 | Temp 98.0°F | Ht 65.0 in | Wt 223.0 lb

## 2016-12-29 DIAGNOSIS — M7061 Trochanteric bursitis, right hip: Secondary | ICD-10-CM

## 2016-12-29 DIAGNOSIS — Z0000 Encounter for general adult medical examination without abnormal findings: Secondary | ICD-10-CM

## 2016-12-29 DIAGNOSIS — Z862 Personal history of diseases of the blood and blood-forming organs and certain disorders involving the immune mechanism: Secondary | ICD-10-CM

## 2016-12-29 LAB — HEMOGLOBIN: HEMOGLOBIN: 13.3 g/dL (ref 12.0–15.0)

## 2016-12-29 LAB — IRON: Iron: 87 ug/dL (ref 42–145)

## 2016-12-29 NOTE — Patient Instructions (Addendum)
Call [facility] at [phone] and ask about a mammogram.  Check with your insurance to see if they cover cologuard.  Let me know and we can get it ordered for you.  Go to the lab on the way out.  We'll contact you with your lab report. Likely trochanteric bursitis.  Try icing 5 minutes on/off.  Don't use heat.  Ask to see Dr. Lorelei Pont if not better.  Take care.  Glad to see you.

## 2016-12-29 NOTE — Progress Notes (Signed)
CPE- See plan.  Routine anticipatory guidance given to patient.  See health maintenance.  The possibility exists that previously documented standard health maintenance information may have been brought forward from a previous encounter into this note.  If needed, that same information has been updated to reflect the current situation based on today's encounter.    Tetanus- 2013 Flu- done at work.  PNA and shingles not due.  No Pap due to hysterectomy.   Mammogram encouraged.  see AVS.   DXA not due yet.   HRT per gyn.   Living will d/w pt.  Husband designated if patient were incapacitated.   D/w patient [id number] for colon cancer screening, including IFOB vs. Colonoscopy vs cologuard.  Risks and benefits of both were discussed and patient voiced understanding.   she opts for cologuard.  See AVS.  HIV likely prev done with prenatal labs ~2003.    Diet and exercise d/w pt.  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  Fatigue noted, reasonable to recheck iron and hgb given her h/o anemia.    R hip pain near the greater trochanter, pain laying on the R side at night.  Pain with doing up steps. No trauma.  No pain with internal rotation.    PMH and SH reviewed  Meds, vitals, and allergies reviewed.   ROS: Per HPI.  Unless specifically indicated otherwise in HPI, the patient denies:  General: fever. Eyes: acute vision changes ENT: sore throat Cardiovascular: chest pain Respiratory: SOB GI: vomiting GU: dysuria Musculoskeletal: acute back pain Derm: acute rash Neuro: acute motor dysfunction Psych: worsening mood Endocrine: polydipsia Heme: bleeding Allergy: hayfever  GEN: nad, alert and oriented HEENT: mucous membranes moist NECK: supple w/o LA CV: rrr. PULM: ctab, no inc wob ABD: soft, +bs EXT: no edema SKIN: no acute rash R greater trochanteric area ttp but normal R hip ROM o/w, no pain on internal rotation and able to bear weight.

## 2017-01-01 DIAGNOSIS — M7060 Trochanteric bursitis, unspecified hip: Secondary | ICD-10-CM | POA: Insufficient documentation

## 2017-01-01 NOTE — Assessment & Plan Note (Signed)
Likely trochanteric bursitis.  Try icing 5 minutes on/off.  Don't use heat.  She'll ask to see Dr. Lorelei Pont if not better.

## 2017-01-01 NOTE — Assessment & Plan Note (Signed)
Tetanus- 2013 Flu- done at work.  PNA and shingles not due.  No Pap due to hysterectomy.   Mammogram encouraged.  see AVS.   DXA not due yet.   HRT per gyn.   Living will d/w pt.  Husband designated if patient were incapacitated.   D/w patient [id number] for colon cancer screening, including IFOB vs. Colonoscopy vs cologuard.  Risks and benefits of both were discussed and patient voiced understanding.   she opts for cologuard.  HIV likely prev done with prenatal labs ~2003.    Diet and exercise d/w pt.  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   Fatigue noted, reasonable to recheck iron and hgb given her h/o anemia.

## 2017-02-27 ENCOUNTER — Other Ambulatory Visit: Payer: Self-pay | Admitting: Obstetrics and Gynecology

## 2017-02-27 DIAGNOSIS — Z1231 Encounter for screening mammogram for malignant neoplasm of breast: Secondary | ICD-10-CM

## 2017-03-02 ENCOUNTER — Ambulatory Visit (INDEPENDENT_AMBULATORY_CARE_PROVIDER_SITE_OTHER): Payer: No Typology Code available for payment source | Admitting: Family Medicine

## 2017-03-02 ENCOUNTER — Encounter: Payer: Self-pay | Admitting: Family Medicine

## 2017-03-02 DIAGNOSIS — R05 Cough: Secondary | ICD-10-CM

## 2017-03-02 DIAGNOSIS — R059 Cough, unspecified: Secondary | ICD-10-CM

## 2017-03-02 MED ORDER — AZITHROMYCIN 250 MG PO TABS: ORAL_TABLET | ORAL | 0 refills | Status: DC

## 2017-03-02 MED ORDER — BENZONATATE 200 MG PO CAPS: 200.0000 mg | ORAL_CAPSULE | Freq: Three times a day (TID) | ORAL | 1 refills | Status: DC | PRN

## 2017-03-02 NOTE — Patient Instructions (Addendum)
Drink plenty of fluids, take tylenol as needed, and gargle with warm salt water for your throat.  Use nasal saline.  Start zirthromax and use tessalon for the cough.  This should gradually improve.  Take care.  Let us know if you have other concerns.   If you need a work then let me know.

## 2017-03-02 NOTE — Progress Notes (Signed)
Sx started about 9-10 days ago.  Dry cough initially, then fatigue.  She is not getting better.  Now with sputum.  Taking otc cough/cold meds.  No fevers known.  Facial pain.  Tooth pain B.  No ear pain.  No vomiting, no diarrhea.  She has sick exposures at work.  Rash in the antecubital area and B forearms and anterior neck, predates the illness by about 1 week.  Itches some.  Used OTC moisturizer.    Meds, vitals, and allergies reviewed.   ROS: Per HPI unless specifically indicated in ROS section   GEN: nad, alert and oriented HEENT: mucous membranes moist, tm w/o erythema, nasal exam w/o erythema, clear discharge noted,  OP with cobblestoning NECK: supple w/o LA CV: rrr.   PULM: ctab, no inc wob EXT: no edema SKIN: very faint rash on the anterior lower neck B

## 2017-03-04 DIAGNOSIS — R05 Cough: Secondary | ICD-10-CM | POA: Insufficient documentation

## 2017-03-04 DIAGNOSIS — R059 Cough, unspecified: Secondary | ICD-10-CM | POA: Insufficient documentation

## 2017-03-04 NOTE — Assessment & Plan Note (Signed)
With sputum, would tx given the timeline.  Start zirthromax and use tessalon for the cough.  Still with lungs ctab exam.  See avs.  She agrees.

## 2017-03-19 ENCOUNTER — Ambulatory Visit (HOSPITAL_COMMUNITY): Payer: Self-pay

## 2017-04-03 ENCOUNTER — Encounter (HOSPITAL_COMMUNITY): Payer: Self-pay | Admitting: Emergency Medicine

## 2017-04-03 ENCOUNTER — Emergency Department (HOSPITAL_COMMUNITY): Payer: No Typology Code available for payment source

## 2017-04-03 ENCOUNTER — Other Ambulatory Visit: Payer: Self-pay

## 2017-04-03 ENCOUNTER — Emergency Department (HOSPITAL_COMMUNITY)
Admission: EM | Admit: 2017-04-03 | Discharge: 2017-04-04 | Disposition: A | Discharge status: Home / Self Care | Payer: No Typology Code available for payment source | Attending: Emergency Medicine | Admitting: Emergency Medicine

## 2017-04-03 DIAGNOSIS — R55 Syncope and collapse: Secondary | ICD-10-CM | POA: Diagnosis not present

## 2017-04-03 DIAGNOSIS — Z79899 Other long term (current) drug therapy: Secondary | ICD-10-CM | POA: Diagnosis not present

## 2017-04-03 DIAGNOSIS — R0789 Other chest pain: Secondary | ICD-10-CM | POA: Diagnosis not present

## 2017-04-03 LAB — I-STAT TROPONIN, ED: TROPONIN I, POC: 0 ng/mL (ref 0.00–0.08)

## 2017-04-03 LAB — CBC WITH DIFFERENTIAL/PLATELET
Basophils Absolute: 0 10*3/uL (ref 0.0–0.1)
Basophils Relative: 0 %
EOS PCT: 1 %
Eosinophils Absolute: 0.1 10*3/uL (ref 0.0–0.7)
HEMATOCRIT: 41.2 % (ref 36.0–46.0)
HEMOGLOBIN: 13.5 g/dL (ref 12.0–15.0)
Lymphocytes Relative: 57 %
Lymphs Abs: 6.2 10*3/uL — ABNORMAL HIGH (ref 0.7–4.0)
MCH: 29.5 pg (ref 26.0–34.0)
MCHC: 32.8 g/dL (ref 30.0–36.0)
MCV: 90 fL (ref 78.0–100.0)
MONO ABS: 0.6 10*3/uL (ref 0.1–1.0)
Monocytes Relative: 6 %
Neutro Abs: 3.9 10*3/uL (ref 1.7–7.7)
Neutrophils Relative %: 36 %
Platelets: 370 10*3/uL (ref 150–400)
RBC: 4.58 MIL/uL (ref 3.87–5.11)
RDW: 14 % (ref 11.5–15.5)
WBC: 10.8 10*3/uL — AB (ref 4.0–10.5)

## 2017-04-03 LAB — COMPREHENSIVE METABOLIC PANEL
ALK PHOS: 93 U/L (ref 38–126)
ALT: 36 U/L (ref 14–54)
ANION GAP: 14 (ref 5–15)
AST: 31 U/L (ref 15–41)
Albumin: 4.2 g/dL (ref 3.5–5.0)
BILIRUBIN TOTAL: 0.8 mg/dL (ref 0.3–1.2)
BUN: 16 mg/dL (ref 6–20)
CO2: 20 mmol/L — ABNORMAL LOW (ref 22–32)
CREATININE: 0.85 mg/dL (ref 0.44–1.00)
Calcium: 9.2 mg/dL (ref 8.9–10.3)
Chloride: 103 mmol/L (ref 101–111)
GFR calc non Af Amer: >60 mL/min (ref >60)
Glucose, Bld: 108 mg/dL — ABNORMAL HIGH (ref 65–99)
Potassium: 2.9 mmol/L — ABNORMAL LOW (ref 3.5–5.1)
Sodium: 137 mmol/L (ref 135–145)
TOTAL PROTEIN: 8.1 g/dL (ref 6.5–8.1)

## 2017-04-03 MED ORDER — KETOROLAC TROMETHAMINE 30 MG/ML IJ SOLN
30.0000 mg | Freq: Once | INTRAMUSCULAR | Status: AC
  Administered 2017-04-03: 30 mg via INTRAVENOUS
  Filled 2017-04-03: qty 1

## 2017-04-03 MED ORDER — MORPHINE SULFATE (PF) 4 MG/ML IV SOLN
4.0000 mg | Freq: Once | INTRAVENOUS | Status: AC
  Administered 2017-04-03: 4 mg via INTRAVENOUS
  Filled 2017-04-03: qty 1

## 2017-04-03 MED ORDER — ONDANSETRON HCL 4 MG/2ML IJ SOLN
4.0000 mg | Freq: Once | INTRAMUSCULAR | Status: AC
  Administered 2017-04-03: 4 mg via INTRAVENOUS
  Filled 2017-04-03: qty 2

## 2017-04-03 MED ORDER — POTASSIUM CHLORIDE CRYS ER 20 MEQ PO TBCR
40.0000 meq | EXTENDED_RELEASE_TABLET | Freq: Once | ORAL | Status: AC
  Administered 2017-04-03: 40 meq via ORAL
  Filled 2017-04-03: qty 2

## 2017-04-03 NOTE — ED Notes (Signed)
Portable Xray at bedside.

## 2017-04-03 NOTE — ED Provider Notes (Signed)
[facility] EMERGENCY DEPARTMENT Provider Note   CSN: [id number] Arrival date & time: 04/03/17  2125     History   Chief Complaint Chief Complaint  Patient presents with  . Chest Pain    HPI [name] is a 52 y.o. female.  Patient complains of chest discomfort today while she was at work.  No shortness of breath.  She also the pain going up her neck   The history is provided by the patient.  Chest Pain   This is a new problem. The current episode started less than 1 hour ago. The problem occurs rarely. The problem has been resolved. The pain is associated with rest. The pain is present in the lateral region. The pain is at a severity of 4/10. The pain is moderate. The quality of the pain is described as dull. Pertinent negatives include no abdominal pain, no back pain, no cough and no headaches.  Pertinent negatives for past medical history include no seizures.    Past Medical History:  Diagnosis Date  . Abdominal pain, other specified site   . Anemia   . Anxiety state, unspecified   . GERD (gastroesophageal reflux disease)   . Headache(784.0)   . Other constipation   . Other specified cardiac dysrhythmias(427.89)    RBBB  . Other symptoms involving digestive system(787.99)     Patient Active Problem List   Diagnosis Date Noted  . Cough 03/04/2017  . Trochanteric bursitis 01/01/2017  . Routine general medical examination at a health care facility 10/04/2015  . Rectocele 01/15/2015  . Pelvic pain in female 11/19/2013  . Rash and nonspecific skin eruption 07/03/2013  . Paresthesia 12/05/2012  . Low back pain 09/20/2010  . Back pain 09/15/2010  . GERD (gastroesophageal reflux disease) 07/28/2010  . ADJUSTMENT REACTION WITH PHYSICAL SYMPTOMS 08/10/2009  . SKIN LESION, BENIGN 11/13/2008  . ECZEMA 01/21/2008  . CHRONIC RHINITIS 10/01/2007  . HEADACHE, CHRONIC 10/01/2007  . HYPERCHOLESTEROLEMIA 03/27/2007  . 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  CONSTIPATION, CHRONIC 03/27/2007  . ANXIETY, MILD 07/16/2006    Past Surgical History:  Procedure Laterality Date  . CARDIAC CATHETERIZATION  12/09/03   Normal  . CERVICAL DISC SURGERY     Ruptured disc repair / and approach c5/6, c6/7; plate  . HERNIA REPAIR     umbilical  . LAPAROSCOPIC ASSISTED VAGINAL HYSTERECTOMY N/A 03/09/2015   Procedure: LAPAROSCOPIC ASSISTED VAGINAL HYSTERECTOMY;  Surgeon: Jonnie Kind, MD;  Location: [facility];  Service: Gynecology;  Laterality: N/A;  . RECTOCELE REPAIR N/A 03/09/2015   Procedure: POSTERIOR REPAIR (RECTOCELE);  Surgeon: Jonnie Kind, MD;  Location: [facility];  Service: Gynecology;  Laterality: N/A;  . SALPINGOOPHORECTOMY Bilateral 03/09/2015   Procedure: SALPINGO OOPHORECTOMY;  Surgeon: Jonnie Kind, MD;  Location: [facility];  Service: Gynecology;  Laterality: Bilateral;  . TUBAL LIGATION    . VAGINAL DELIVERY  90, 97, 99, 03   x 4     OB History    Gravida Para Term Preterm AB Living   6 4 3 1 2 4    SAB TAB Ectopic Multiple Live Births     2             Home Medications    Prior to Admission medications   Medication Sig Start Date End Date Taking? Authorizing Provider  estradiol (CLIMARA - DOSED IN MG/24 HR) 0.1 mg/24hr patch PLACE 1 PATCH ONTO THE SKIN ONCE A WEEK 05/21/16  Yes Jonnie Kind, MD  ibuprofen (  ADVIL,MOTRIN) 200 MG tablet Take 400 mg by mouth every 6 (six) hours as needed for moderate pain.   Yes [provider]  Multiple Vitamins-Minerals (MULTI-VITAMIN GUMMIES) CHEW Chew 1 tablet by mouth daily.  05/30/16  Yes [provider]    Family History Family History  Problem Relation Age of Onset  . Deep vein thrombosis Mother        cerebral blood clot   . Heart failure Father   . Diabetes Maternal Grandmother   . Stroke Maternal Grandmother   . Cancer Maternal Grandfather        Prostate   . Depression Maternal Uncle   . Hypertension Neg Hx   . Colon cancer Neg Hx   . Breast cancer Neg Hx      Social History Social History   Tobacco Use  . Smoking status: Never Smoker  . Smokeless tobacco: Never Used  Substance Use Topics  . Alcohol use: No    Alcohol/week: 0.0 oz  . Drug use: No     Allergies   Sulfonamide derivatives   Review of Systems Review of Systems  Constitutional: Negative for appetite change and fatigue.  HENT: Negative for congestion, ear discharge and sinus pressure.   Eyes: Negative for discharge.  Respiratory: Negative for cough.   Cardiovascular: Positive for chest pain.  Gastrointestinal: Negative for abdominal pain and diarrhea.  Genitourinary: Negative for frequency and hematuria.  Musculoskeletal: Negative for back pain.  Skin: Negative for rash.  Neurological: Negative for seizures and headaches.  Psychiatric/Behavioral: Negative for hallucinations.     Physical Exam Updated Vital Signs BP 123/60   Pulse 71   Temp 98.1 F (36.7 C) (Oral)   Resp 17   LMP 01/31/2015   SpO2 98%   Physical Exam  Constitutional: She is oriented to person, place, and time. She appears well-developed.  HENT:  Head: Normocephalic.  Eyes: Conjunctivae and EOM are normal. No scleral icterus.  Neck: Neck supple. No thyromegaly present.  Cardiovascular: Normal rate and regular rhythm. Exam reveals no gallop and no friction rub.  No murmur heard. Pulmonary/Chest: No stridor. She has no wheezes. She has no rales. She exhibits no tenderness.  Abdominal: She exhibits no distension. There is no tenderness. There is no rebound.  Musculoskeletal: Normal range of motion. She exhibits no edema.  Lymphadenopathy:    She has no cervical adenopathy.  Neurological: She is oriented to person, place, and time. She exhibits normal muscle tone. Coordination normal.  Skin: No rash noted. No erythema.  Psychiatric: She has a normal mood and affect. Her behavior is normal.     ED Treatments / Results  Labs (all labs ordered are listed, but only abnormal results are  displayed) Labs Reviewed  CBC WITH DIFFERENTIAL/PLATELET - Abnormal; Notable for the following components:      Result Value   WBC 10.8 (*)    Lymphs Abs 6.2 (*)    All other components within normal limits  COMPREHENSIVE METABOLIC PANEL - Abnormal; Notable for the following components:   Potassium 2.9 (*)    CO2 20 (*)    Glucose, Bld 108 (*)    All other components within normal limits  TROPONIN I  I-STAT TROPONIN, ED    EKG  EKG Interpretation  Date/Time:  Tuesday April 03 2017 21:32:33 EST Ventricular Rate:  82 PR Interval:    QRS Duration: 167 QT Interval:  431 QTC Calculation: 504 R Axis:   -49 Text Interpretation:  Sinus rhythm RBBB and  LAFB Confirmed by Milton Ferguson [phone]) on 04/03/2017 9:36:10 PM       Radiology Dg Chest Portable 1 View  Result Date: 04/03/2017 CLINICAL DATA:  Chest pain with nausea EXAM: PORTABLE CHEST 1 VIEW COMPARISON:  07/03/2011 FINDINGS: Borderline cardiomegaly with mild central congestion. No focal airspace disease or pleural effusion. No pneumothorax. Surgical changes in the cervical spine. IMPRESSION: Borderline cardiomegaly with mild central congestion. Electronically Signed   By: Donavan Foil M.D.   On: 04/03/2017 22:09    Procedures Procedures (including critical care time)  Medications Ordered in ED Medications  ondansetron [facility]) injection 4 mg (4 mg Intravenous Given 04/03/17 2154)  morphine 4 MG/ML injection 4 mg (4 mg Intravenous Given 04/03/17 2154)  potassium chloride SA (K-DUR,KLOR-CON) CR tablet 40 mEq (40 mEq Oral Given 04/03/17 2240)  ketorolac (TORADOL) 30 MG/ML injection 30 mg (30 mg Intravenous Given 04/03/17 2250)     Initial Impression / Assessment and Plan / ED Course  I have reviewed the triage vital signs and the nursing notes.  Pertinent labs & imaging results that were available during my care of the patient were reviewed by me and considered in my medical decision making (see chart for details). Patient with  chest pain atypical.  Labs unremarkable with 2 troponins normal.  Patient does have some low potassium and she is given some potassium p.o. we will follow up with her doctor      Final Clinical Impressions(s) / ED Diagnoses   Final diagnoses:  None    ED Discharge Orders    None       Milton Ferguson, MD 04/06/17 [phone]

## 2017-04-03 NOTE — ED Triage Notes (Signed)
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 Pt states she feels as if she is going to pass out.

## 2017-04-03 NOTE — ED Notes (Signed)
ED Provider at bedside. 

## 2017-04-04 ENCOUNTER — Telehealth: Payer: Self-pay | Admitting: Family Medicine

## 2017-04-04 LAB — I-STAT TROPONIN, ED: Troponin i, poc: 0 ng/mL (ref 0.00–0.08)

## 2017-04-04 LAB — LIPASE, BLOOD: Lipase: 24 U/L (ref 11–51)

## 2017-04-04 LAB — TROPONIN I

## 2017-04-04 MED ORDER — SODIUM CHLORIDE 0.9 % IV BOLUS (SEPSIS)
1000.0000 mL | Freq: Once | INTRAVENOUS | Status: AC
  Administered 2017-04-04: 1000 mL via INTRAVENOUS

## 2017-04-04 MED ORDER — ONDANSETRON HCL 4 MG/2ML IJ SOLN
4.0000 mg | Freq: Once | INTRAMUSCULAR | Status: AC
Start: 2017-04-04 — End: 2017-04-04
  Administered 2017-04-04: 4 mg via INTRAVENOUS
  Filled 2017-04-04: qty 2

## 2017-04-04 MED ORDER — POTASSIUM CHLORIDE CRYS ER 20 MEQ PO TBCR
40.0000 meq | EXTENDED_RELEASE_TABLET | Freq: Once | ORAL | Status: AC
  Administered 2017-04-04: 40 meq via ORAL
  Filled 2017-04-04: qty 2

## 2017-04-04 NOTE — ED Provider Notes (Signed)
Care assumed from Dr. Roderic Palau.  Patient with episode of intermittent left-sided chest pain that came on at rest associated with nausea, dizziness and near syncope.  Pain lasted intermittently for a matter of minutes but was several seconds at a time. Patient reports negative catheterization 10 years ago.  No known CAD.  Heart score 2. Repeat troponin and EKG are unchanged.  Lipase is normal.  Patient's orthostatics are positive by heart rate.  She also did not eat dinner.  Will hydrate and feed patient.  She is comfortable going home to schedule outpatient stress test.  She does not want to be admitted to the hospital.  Instructed to call her doctor and the cardiologist to arrange stress test.  Return precautions discussed including exertional chest pain, diaphoresis, shortness of breath or any other concerns.   Ezequiel Essex, MD 04/04/17 [phone]

## 2017-04-04 NOTE — Discharge Instructions (Signed)
There is no evidence of heart attack.  Keep yourself hydrated.  Follow-up with your doctor for a stress test.  Return to the ED if your chest pain returns, becomes exertional or associated with nausea, vomiting, sweating or any other concerns.

## 2017-04-04 NOTE — Telephone Encounter (Signed)
Left message on voicemail for patient to call back. 

## 2017-04-04 NOTE — Telephone Encounter (Signed)
Spoke to patient and was advised that she does want a referral to a cardiologist. Patient states that she thinks that she saw Dr. Stanford Breed some time ago, but not sure.  Advised patient that one of the referral coordinators will be in touch to get this set up.

## 2017-04-04 NOTE — Telephone Encounter (Signed)
Copied from [place] [id number]. Topic: Quick Communication - See Telephone Encounter >> Apr 04, 2017 12:23 PM Laws, [place]ers, LPN wrote: CRM for notification. See Telephone encounter for:   04/04/17.   Okay for Pec/triage nurse to see if patient wants referral per Dr. Josefine Class note. >> Apr 04, 2017  2:09 PM Neva Seat wrote: Pt returned missed call.  NT was busy.  Pt asked if someone can call her back.

## 2017-04-04 NOTE — Telephone Encounter (Signed)
Call pt.  Recently seen in ER.  Let me know if she needs referral to cardiology.  Thanks.   Per notes:She is comfortable going home to schedule outpatient stress test.  She does not want to be admitted to the hospital.  Instructed to call her doctor and the cardiologist to arrange stress test.  Return precautions discussed including exertional chest pain, diaphoresis, shortness of breath or any other concerns.

## 2017-04-30 ENCOUNTER — Ambulatory Visit (INDEPENDENT_AMBULATORY_CARE_PROVIDER_SITE_OTHER): Payer: No Typology Code available for payment source | Admitting: Family Medicine

## 2017-04-30 ENCOUNTER — Encounter: Payer: Self-pay | Admitting: Family Medicine

## 2017-04-30 VITALS — BP 126/82 | HR 94 | Temp 98.5°F | Wt 216.5 lb

## 2017-04-30 DIAGNOSIS — R55 Syncope and collapse: Secondary | ICD-10-CM

## 2017-04-30 DIAGNOSIS — R42 Dizziness and giddiness: Secondary | ICD-10-CM

## 2017-04-30 DIAGNOSIS — R252 Cramp and spasm: Secondary | ICD-10-CM | POA: Diagnosis not present

## 2017-04-30 DIAGNOSIS — J069 Acute upper respiratory infection, unspecified: Secondary | ICD-10-CM

## 2017-04-30 NOTE — Progress Notes (Signed)
She had ER eval 04/03/17.  She didn't have sx after that until yesterday.  She was laying down, then about to get up to go to work.  She got dizzy, lightheaded, the room would spin but she also felt like she was going to pass out.  She didn't pass out.  She rested the rest of the night.  This AM she had photophobia but not now.  She had some conjunctival irritation.  No fevers.  No vomiting.  No diarrhea.  No cough.  No ST.   She has sinus tenderness that started yesterday.  She feels like she has some chest congestion.  When rolling on the L side last night she had chest soreness but that resolved in the meantime.  She didn't have CP when she was lightheaded.  No CP now.  K prev low.  Reasonable to recheck.  D/w pt. She has noted some occ muscle cramping.   D/w pt about prev ER eval and possible cards eval.  This needs to be set up, referral done today, d/w pt.    She B groin pain and some L anterior leg pain.  Those started about 1 year ago, episodically.    PMH and SH reviewed  ROS: Per HPI unless specifically indicated in ROS section   Meds, vitals, and allergies reviewed.   GEN: nad, alert and oriented HEENT: mucous membranes moist, tm w/o erythema, nasal exam w/o erythema, clear discharge noted,  OP with cobblestoning, Sinuses ttp x4 NECK: supple w/o LA CV: rrr.   PULM: ctab, no inc wob, L chest wall ttp in midaxillary line.   EXT: no edema SKIN: no acute rash She doesn't have weakness in the BLE but she has possible quad origin tenderness B.  She is locally tender there and it is worse when she is standing with extension at the waist.  She has less discomfort with flexion at the waist.  Recheck EKG d/w pt.  No sig change from prev.

## 2017-04-30 NOTE — Patient Instructions (Signed)
We will call about your referral to cardiology.  Make sure to drink plenty of fluid in the meantime.  Go to the lab on the way out.  We'll contact you with your lab report. If your potassium isn't low (to explain the leg pain/cramping), then we may need to get you set up with ortho.  Take care.  Glad to see you.

## 2017-05-01 LAB — BASIC METABOLIC PANEL
BUN: 12 mg/dL (ref 6–23)
CO2: 26 meq/L (ref 19–32)
CREATININE: 0.67 mg/dL (ref 0.40–1.20)
Calcium: 9 mg/dL (ref 8.4–10.5)
Chloride: 106 mEq/L (ref 96–112)
GFR: 118.61 mL/min (ref >60.00)
GLUCOSE: 85 mg/dL (ref 70–99)
Potassium: 4 mEq/L (ref 3.5–5.1)
Sodium: 138 mEq/L (ref 135–145)

## 2017-05-02 DIAGNOSIS — J069 Acute upper respiratory infection, unspecified: Secondary | ICD-10-CM | POA: Insufficient documentation

## 2017-05-02 DIAGNOSIS — R252 Cramp and spasm: Secondary | ICD-10-CM | POA: Insufficient documentation

## 2017-05-02 DIAGNOSIS — R55 Syncope and collapse: Principal | ICD-10-CM

## 2017-05-02 DIAGNOSIS — R42 Dizziness and giddiness: Secondary | ICD-10-CM | POA: Insufficient documentation

## 2017-05-02 NOTE — Assessment & Plan Note (Signed)
She looks to have mild benign URI symptoms that are likely viral.  Supportive care.  Update me as needed.  I think this is a separate issue from her other symptoms.

## 2017-05-02 NOTE — Assessment & Plan Note (Signed)
Recheck potassium.  Assuming this is normal, then it may be reasonable for her to see orthopedics.  She is having recurrent cramping that I cannot quite explain.  He does not like she has tenderness at the origin of the quads but without any significant injury.

## 2017-05-02 NOTE — Assessment & Plan Note (Addendum)
Refer to cardiology.  Recheck labs today.  Previous had low potassium.  Reasonable to recheck.  At this point still okay for outpatient follow-up.  EKG without acute change compared to previous.  Discussed with patient about previous ER evaluation and testing at that point.  She does have chest wall pain but it is reproducible and this appears to be a benign issue. >25 minutes spent in face to face time with patient, >50% spent in counselling or coordination of care.

## 2017-05-04 ENCOUNTER — Telehealth: Payer: Self-pay

## 2017-05-04 NOTE — Telephone Encounter (Signed)
Left message for patient to call Anastasiya back in regards to a referral-Anastasiya V Hopkins, RMA   

## 2017-05-22 ENCOUNTER — Other Ambulatory Visit: Payer: Self-pay | Admitting: Obstetrics and Gynecology

## 2017-05-24 NOTE — Progress Notes (Signed)
Raynald Blend MD Reason for referral-Dizziness and Chest pain  HPI: 52 yo female for evaluation of dizziness and chest pain at request of Elsie Stain MD. Echo 12/08 showed normal LV function. Seen in ER 04/03/17 with CP and dizziness. Chest xray showed borderline CE and mild congestion. Troponins, LFTs, lipase and Hgb normal.  Patient states on the day of her evaluation she stood and became dizzy.  She walked a distance and her symptoms worsened.  She felt weak.  She then went to the emergency room and developed chest pain that was substernal radiating to her left upper extremity.  Her pain increased with palpation of her left arm.  Her chest pain was not pleuritic, positional or exertional.  Lasted 15 minutes and resolved.  She was not short of breath and there was no nausea or diaphoresis.  She does have some dyspnea on exertion but there is no orthopnea, PND or pedal edema.  No exertional chest pain.  Cardiology asked to evaluate.  Current Outpatient Medications  Medication Sig Dispense Refill  . estradiol (CLIMARA - DOSED IN MG/24 HR) 0.1 mg/24hr patch PLACE 1 PATCH ONTO THE SKIN ONCE A WEEK 4 patch 5  . ibuprofen (ADVIL,MOTRIN) 200 MG tablet Take 400 mg by mouth every 6 (six) hours as needed for moderate pain.    . Multiple Vitamins-Minerals (MULTI-VITAMIN GUMMIES) CHEW Chew 1 tablet by mouth daily.      No current facility-administered medications for this visit.     Allergies  Allergen Reactions  . Sulfonamide Derivatives Rash    Past Medical History:  Diagnosis Date  . Abdominal pain, other specified site   . Anemia   . Anxiety state, unspecified   . GERD (gastroesophageal reflux disease)   . Headache(784.0)   . Other constipation   . Other specified cardiac dysrhythmias(427.89)    RBBB  . Other symptoms involving digestive system(787.99)     Past Surgical History:  Procedure Laterality Date  . CARDIAC CATHETERIZATION  12/09/03   Normal  . CERVICAL DISC  SURGERY     Ruptured disc repair / and approach c5/6, c6/7; plate  . HERNIA REPAIR     umbilical  . LAPAROSCOPIC ASSISTED VAGINAL HYSTERECTOMY N/A 03/09/2015   Procedure: LAPAROSCOPIC ASSISTED VAGINAL HYSTERECTOMY;  Surgeon: Jonnie Kind, MD;  Location: [facility];  Service: Gynecology;  Laterality: N/A;  . RECTOCELE REPAIR N/A 03/09/2015   Procedure: POSTERIOR REPAIR (RECTOCELE);  Surgeon: Jonnie Kind, MD;  Location: [facility];  Service: Gynecology;  Laterality: N/A;  . SALPINGOOPHORECTOMY Bilateral 03/09/2015   Procedure: SALPINGO OOPHORECTOMY;  Surgeon: Jonnie Kind, MD;  Location: [facility];  Service: Gynecology;  Laterality: Bilateral;  . TUBAL LIGATION    . VAGINAL DELIVERY  90, 97, 99, 03   x 4     Social History   Socioeconomic History  . Marital status: Married    Spouse name: [name]  . Number of children: 4  . Years of education: 76  . Highest education level: Not on file  Occupational History  . Occupation: [employer]     Employer: [employer]     Employer: [employer]  Social Needs  . Financial resource strain: Not on file  . Food insecurity:    Worry: Not on file    Inability: Not on file  . Transportation needs:    Medical: Not on file    Non-medical: Not on file  Tobacco Use  .  Smoking status: Never Smoker  . Smokeless tobacco: Never Used  Substance and Sexual Activity  . Alcohol use: No    Alcohol/week: 0.0 oz  . Drug use: No  . Sexual activity: Not Currently    Birth control/protection: Surgical  Lifestyle  . Physical activity:    Days per week: Not on file    Minutes per session: Not on file  . Stress: Not on file  Relationships  . Social connections:    Talks on phone: Not on file    Gets together: Not on file    Attends religious service: Not on file    Active member of club or organization: Not on file    Attends meetings of clubs or organizations: Not on file    Relationship status: Not on file  .  Intimate partner violence:    Fear of current or ex partner: Not on file    Emotionally abused: Not on file    Physically abused: Not on file    Forced sexual activity: Not on file  Other Topics Concern  . Not on file  Social History Narrative   Patient lives with her husband [name] ) and her children.   Patient has four children.   Patient works full-time.   Patient has a college education.   Patient is right-handed.   Patient drinks caffeine- once or twice a week.    Family History  Problem Relation Age of Onset  . Deep vein thrombosis Mother        cerebral blood clot   . Heart failure Father   . Diabetes Maternal Grandmother   . Stroke Maternal Grandmother   . Cancer Maternal Grandfather        Prostate   . Depression Maternal Uncle   . Hypertension Neg Hx   . Colon cancer Neg Hx   . Breast cancer Neg Hx     ROS: Bilateral leg pain worse with lying down and improved with walking.  No fevers or chills, productive cough, hemoptysis, dysphasia, odynophagia, melena, hematochezia, dysuria, hematuria, rash, seizure activity, orthopnea, PND, pedal edema, claudication. Remaining systems are negative.  Physical Exam:   Last menstrual period 01/31/2015.  General:  Well developed/well nourished in NAD Skin warm/dry Patient not depressed No peripheral clubbing Back-normal HEENT-normal/normal eyelids Neck supple/normal carotid upstroke bilaterally; no bruits; no JVD; no thyromegaly chest - CTA/ normal expansion CV - RRR/normal S1 and S2; no murmurs, rubs or gallops;  PMI nondisplaced Abdomen -NT/ND, no HSM, no mass, + bowel sounds, no bruit 2+ femoral pulses, no bruits Ext-no edema, chords, 2+ DP Neuro-grossly nonfocal  ECG - 04/03/17; NSR, RBBB. personally reviewed  A/P  1 chest pain-symptoms are atypical.  Patient states she has had 2 catheterizations in the past and there is documentation of one in 2005 showing normal coronary arteries.  She typically does not have  exertional chest pain.  Her enzymes were negative in the emergency room.  I have elected not to pursue further ischemia evaluation unless she has recurrent episodes in the future.  2 dizziness-question orthostatic versus vagal component.  No recurrent episodes.  Follow and work-up further as needed.  We discussed hydration.  3 leg pain-symptoms are not consistent with claudication.  2+ dorsalis pedis pulses bilaterally.  She is planning orthopedic evaluation.  Kirk Ruths, MD

## 2017-05-30 ENCOUNTER — Ambulatory Visit (INDEPENDENT_AMBULATORY_CARE_PROVIDER_SITE_OTHER): Payer: No Typology Code available for payment source | Admitting: Cardiology

## 2017-05-30 ENCOUNTER — Telehealth: Payer: Self-pay | Admitting: Cardiology

## 2017-05-30 ENCOUNTER — Encounter: Payer: Self-pay | Admitting: Cardiology

## 2017-05-30 VITALS — BP 107/74 | HR 89 | Ht 64.5 in | Wt 215.8 lb

## 2017-05-30 DIAGNOSIS — R55 Syncope and collapse: Secondary | ICD-10-CM

## 2017-05-30 DIAGNOSIS — R072 Precordial pain: Secondary | ICD-10-CM

## 2017-05-30 DIAGNOSIS — R42 Dizziness and giddiness: Secondary | ICD-10-CM

## 2017-05-30 DIAGNOSIS — M79605 Pain in left leg: Secondary | ICD-10-CM

## 2017-05-30 NOTE — Patient Instructions (Signed)
Your physician wants you to follow-up in: 6 MONTHS WITH DR CRENSHAW You will receive a reminder letter in the mail two months in advance. If you don't receive a letter, please call our office to schedule the follow-up appointment.   If you need a refill on your cardiac medications before your next appointment, please call your pharmacy.  

## 2017-08-03 ENCOUNTER — Ambulatory Visit: Payer: No Typology Code available for payment source | Admitting: Cardiology

## 2017-09-07 ENCOUNTER — Other Ambulatory Visit: Payer: Self-pay | Admitting: Obstetrics and Gynecology

## 2017-11-08 ENCOUNTER — Encounter: Payer: Self-pay | Admitting: Family Medicine

## 2017-11-08 ENCOUNTER — Ambulatory Visit (INDEPENDENT_AMBULATORY_CARE_PROVIDER_SITE_OTHER): Payer: No Typology Code available for payment source | Admitting: Family Medicine

## 2017-11-08 ENCOUNTER — Encounter: Payer: Self-pay | Admitting: *Deleted

## 2017-11-08 VITALS — BP 116/70 | HR 68 | Temp 98.5°F | Ht 65.0 in | Wt 217.5 lb

## 2017-11-08 DIAGNOSIS — R5383 Other fatigue: Secondary | ICD-10-CM | POA: Diagnosis not present

## 2017-11-08 DIAGNOSIS — G479 Sleep disorder, unspecified: Secondary | ICD-10-CM

## 2017-11-08 DIAGNOSIS — Z23 Encounter for immunization: Secondary | ICD-10-CM | POA: Diagnosis not present

## 2017-11-08 LAB — CBC WITH DIFFERENTIAL/PLATELET
BASOS PCT: 0.2 % (ref 0.0–3.0)
Basophils Absolute: 0 10*3/uL (ref 0.0–0.1)
Eosinophils Absolute: 0.1 10*3/uL (ref 0.0–0.7)
Eosinophils Relative: 1.1 % (ref 0.0–5.0)
HEMATOCRIT: 41.8 % (ref 36.0–46.0)
Hemoglobin: 13.7 g/dL (ref 12.0–15.0)
LYMPHS ABS: 3.2 10*3/uL (ref 0.7–4.0)
LYMPHS PCT: 47.8 % — AB (ref 12.0–46.0)
MCHC: 32.7 g/dL (ref 30.0–36.0)
MCV: 91.6 fl (ref 78.0–100.0)
Monocytes Absolute: 0.4 10*3/uL (ref 0.1–1.0)
Monocytes Relative: 6.3 % (ref 3.0–12.0)
NEUTROS ABS: 3 10*3/uL (ref 1.4–7.7)
NEUTROS PCT: 44.6 % (ref 43.0–77.0)
PLATELETS: 347 10*3/uL (ref 150.0–400.0)
RBC: 4.56 Mil/uL (ref 3.87–5.11)
RDW: 14.8 % (ref 11.5–15.5)
WBC: 6.8 10*3/uL (ref 4.0–10.5)

## 2017-11-08 LAB — COMPREHENSIVE METABOLIC PANEL
ALT: 16 U/L (ref 0–35)
AST: 15 U/L (ref 0–37)
Albumin: 4.1 g/dL (ref 3.5–5.2)
Alkaline Phosphatase: 76 U/L (ref 39–117)
BILIRUBIN TOTAL: 0.6 mg/dL (ref 0.2–1.2)
BUN: 13 mg/dL (ref 6–23)
CHLORIDE: 106 meq/L (ref 96–112)
CO2: 30 meq/L (ref 19–32)
Calcium: 9.4 mg/dL (ref 8.4–10.5)
Creatinine, Ser: 0.86 mg/dL (ref 0.40–1.20)
GFR: 88.74 mL/min (ref >60.00)
GLUCOSE: 104 mg/dL — AB (ref 70–99)
Potassium: 4 mEq/L (ref 3.5–5.1)
Sodium: 141 mEq/L (ref 135–145)
Total Protein: 7.6 g/dL (ref 6.0–8.3)

## 2017-11-08 LAB — IBC PANEL
Iron: 85 ug/dL (ref 42–145)
Saturation Ratios: 22.7 % (ref 20.0–50.0)
Transferrin: 268 mg/dL (ref 212.0–360.0)

## 2017-11-08 LAB — TSH: TSH: 1.07 u[IU]/mL (ref 0.35–4.50)

## 2017-11-08 NOTE — Progress Notes (Signed)
Fatigue. She can sleep 10-12 hours, then still need a nap.  Noted in the last 6-10 months, most of this year.  She is caring for her mother who had MI x2 in April.  She is going back and forth to [facility] to care for her.  Pt's kids are in college.  She is working 3rd shift at [employer].    No FCNAVD.  She doesn't feel sick but feels tired.  She has some sinus congestion.    Snoring, waking from snoring but not gasping for air.  She had prev sleep study with periodic limb movement disorder but not OSA.  Not on CPAP.  She was drowsy on requip and had to stop that.  Her legs are jumpy at night.    No Si/Hi.  She has a lot of external stressors currently.    Meds, vitals, and allergies reviewed.   ROS: Per HPI unless specifically indicated in ROS section   GEN: nad, alert and oriented HEENT: mucous membranes moist NECK: supple w/o LA CV: rrr PULM: ctab, no inc wob ABD: soft, +bs EXT: no edema SKIN: no acute rash CN 2-12 wnl B, S/S/DTR wnl B

## 2017-11-08 NOTE — Patient Instructions (Signed)
Go to the lab on the way out.  We'll contact you with your lab report. We will call about your referral.  [name] or [name] will call you if you don't see one of them on the way out.  Take care.  Glad to see you.

## 2017-11-08 NOTE — Assessment & Plan Note (Signed)
She has mult social stressors.  Labs to exclude other common issues today.  Refer to neuro for OSA testing.  D/w pt about mood.  No SI/HI.  ddx for fatigue d/w pt.  She agrees with plan.  Okay for outpatient f/u.

## 2017-11-21 ENCOUNTER — Ambulatory Visit: Payer: Self-pay | Admitting: *Deleted

## 2017-11-21 NOTE — Telephone Encounter (Signed)
Pt called in c/o of having "Athlete's Foot in her private area, front of her genital area and on her inner thighs. It started out as Athlete's Foot with her husband.   It then spread to his genital area causing jock itch.   She then got it from him after sexual relations. She was requesting something she could try at home before seeing the doctor.  She and her husband were using Lotrim spray.  I went over the home care advice with her with the names of the antifungal creams to try.    She mentioned if the OTC medication does not work she will call her gynecologist.   I let her know that was a good idea however if she or her husband needed [place] to call us back for an appt.   She was agreeable to this plan.   Reason for Disposition . Mild athlete's foot  Answer Assessment - Initial Assessment Questions 1. APPEARANCE of RASH: "What does the rash look like?"      My husband had jock itch and athelete foot. 2. LOCATION: "Which part of the foot is involved?" "Are both feet involved?"      It's in my privete parts 3. SIZE: "How large is the infected area?" (Inches or centimeters)      Mainly on the front part and my legs. 4. ONSET: "When did the rash start?"     Last week. 5. OTHER SYMPTOMS: "Do you have any other symptoms?" (e.g., fever)     Itching 6. PREGNANCY: "Is there any chance you are pregnant?" "When was your last menstrual period?"     Not asked.  Protocols used: ATHLETE'S FOOT-A-AH

## 2017-11-23 ENCOUNTER — Telehealth: Payer: Self-pay | Admitting: Family Medicine

## 2017-11-23 NOTE — Telephone Encounter (Signed)
Immunization record faxed to the number provided.

## 2017-11-23 NOTE — Telephone Encounter (Signed)
Copied from [place] [id number]. Topic: Quick Communication - See Telephone Encounter >> Nov 23, 2017 10:40 AM Conception Chancy, NT wrote: CRM for notification. See Telephone encounter for: 11/23/17.  Patient is calling and states she was notified by [facility] that she has not received her flu shot this year. Patient received it in office on 11/08/17. She would like the conformation faxed to her supervisor. Fax# [phone] attention [name]

## 2017-11-26 NOTE — Telephone Encounter (Signed)
Patient says [name] did not receive the immunization fax. Please re-fax. Fax# [phone] attention [name]. Would like a call back to confirm when it's faxed. Supposed to go into work later today but can't if this is not received.

## 2017-11-26 NOTE — Telephone Encounter (Addendum)
Immunization record refaxed to [place] at [phone].  Left message for Verlee that the information she requested was re-faxed to [facility] and I did receive conformation that it did go through.

## 2018-02-18 ENCOUNTER — Other Ambulatory Visit (HOSPITAL_COMMUNITY): Payer: Self-pay | Admitting: Family Medicine

## 2018-02-18 DIAGNOSIS — Z1231 Encounter for screening mammogram for malignant neoplasm of breast: Secondary | ICD-10-CM

## 2018-02-27 ENCOUNTER — Ambulatory Visit (HOSPITAL_COMMUNITY)
Admission: EM | Admit: 2018-02-27 | Discharge: 2018-02-27 | Disposition: A | Discharge status: Home / Self Care | Payer: No Typology Code available for payment source | Attending: Family Medicine | Admitting: Family Medicine

## 2018-02-27 ENCOUNTER — Encounter (HOSPITAL_COMMUNITY): Payer: Self-pay

## 2018-02-27 ENCOUNTER — Ambulatory Visit (INDEPENDENT_AMBULATORY_CARE_PROVIDER_SITE_OTHER): Payer: No Typology Code available for payment source

## 2018-02-27 DIAGNOSIS — S52531A Colles' fracture of right radius, initial encounter for closed fracture: Secondary | ICD-10-CM | POA: Diagnosis not present

## 2018-02-27 MED ORDER — HYDROCODONE-ACETAMINOPHEN 5-325 MG PO TABS
ORAL_TABLET | ORAL | Status: AC
  Filled 2018-02-27: qty 1

## 2018-02-27 MED ORDER — HYDROCODONE-ACETAMINOPHEN 5-325 MG PO TABS
1.0000 | ORAL_TABLET | Freq: Once | ORAL | Status: AC
  Administered 2018-02-27: 1 via ORAL

## 2018-02-27 MED ORDER — HYDROCODONE-ACETAMINOPHEN 5-325 MG PO TABS: 1.0000 | ORAL_TABLET | Freq: Four times a day (QID) | ORAL | 0 refills | Status: DC | PRN

## 2018-02-27 NOTE — Discharge Instructions (Addendum)
Be aware, pain medications may cause drowsiness. Please do not drive, operate heavy machinery or make important decisions while on this medication, it can cloud your judgement.  

## 2018-02-27 NOTE — ED Notes (Signed)
Ortho has been paged/notified & are on the way.

## 2018-02-27 NOTE — ED Triage Notes (Signed)
Pt states fell landing on rt wrist, deformity noted

## 2018-02-28 NOTE — H&P (Signed)
[name] is an 53 y.o. female.   Chief Complaint: RIGHT WRIST INJURY  HPI: The patient is a 53 year old right-hand dominant female who fell and injured the right wrist on 02/27/18. She had initial pain, weakness, numbness, tingling, swelling, and stiffness.  She was evaluated at the urgent care where she was put into a sling, sugar tong splint, and given pain medicine. The patient followed up at our office where we discussed the reason and rationale for surgery and the use of a plate and screws for internal fixation.  She will remain in her splint until the time of surgery. She is here today for surgery. She denies chest pain, shortness of breath, nausea, vomiting, diarrhea, fever, or chills.   Past Medical History:  Diagnosis Date  . Anemia   . Anxiety state, unspecified   . GERD (gastroesophageal reflux disease)   . Headache(784.0)   . Other constipation   . SVT (supraventricular tachycardia) (HCC)     Past Surgical History:  Procedure Laterality Date  . CARDIAC CATHETERIZATION  12/09/03   Normal  . CERVICAL DISC SURGERY     Ruptured disc repair / and approach c5/6, c6/7; plate  . HERNIA REPAIR     umbilical  . LAPAROSCOPIC ASSISTED VAGINAL HYSTERECTOMY N/A 03/09/2015   Procedure: LAPAROSCOPIC ASSISTED VAGINAL HYSTERECTOMY;  Surgeon: Jonnie Kind, MD;  Location: [facility];  Service: Gynecology;  Laterality: N/A;  . RECTOCELE REPAIR N/A 03/09/2015   Procedure: POSTERIOR REPAIR (RECTOCELE);  Surgeon: Jonnie Kind, MD;  Location: [facility];  Service: Gynecology;  Laterality: N/A;  . SALPINGOOPHORECTOMY Bilateral 03/09/2015   Procedure: SALPINGO OOPHORECTOMY;  Surgeon: Jonnie Kind, MD;  Location: [facility];  Service: Gynecology;  Laterality: Bilateral;  . TUBAL LIGATION    . VAGINAL DELIVERY  90, 97, 99, 03   x 4     Family History  Problem Relation Age of Onset  . Deep vein thrombosis Mother        cerebral blood clot   . Heart attack Mother   . Heart failure Father   .  Diabetes Maternal Grandmother   . Stroke Maternal Grandmother   . Cancer Maternal Grandfather        Prostate   . Depression Maternal Uncle   . Hypertension Neg Hx   . Colon cancer Neg Hx   . Breast cancer Neg Hx    Social History:  reports that she has never smoked. She has never used smokeless tobacco. She reports that she does not drink alcohol or use drugs.  Allergies:  Allergies  Allergen Reactions  . Sulfonamide Derivatives Rash    No medications prior to admission.    No results found for this or any previous visit (from the past 48 hour(s)). Dg Wrist Complete Right  Result Date: 02/27/2018 CLINICAL DATA:  Wrist deformity EXAM: RIGHT WRIST - COMPLETE 3+ VIEW COMPARISON:  None. FINDINGS: Acute comminuted and impacted intra-articular distal radius fracture with close to 1/2 bone with dorsal displacement of distal fracture fragment. No subluxation. IMPRESSION: Acute comminuted and dorsally displaced intra-articular distal radius fracture. Electronically Signed   By: Donavan Foil M.D.   On: 02/27/2018 19:41    ROS NO RECENT ILLNESSES OR HOSPITALIZATIONS  Last menstrual period 01/31/2015. Physical Exam  General Appearance:  Alert, cooperative, no distress, appears stated age  Head:  Normocephalic, without obvious abnormality, atraumatic  Eyes:  Pupils equal, conjunctiva/corneas clear,         Throat: Lips, mucosa, and tongue normal;  teeth and gums normal  Neck: No visible masses     Lungs:   respirations unlabored  Chest Wall:  No tenderness or deformity  Heart:  Regular rate and rhythm,  Abdomen:   Soft, non-tender,         Extremities: RUE: SKIN INTACT, FINGERS WARM WELL PERFUSED GOOD DIGITAL MOTION. SPLINT IN PLACE  Pulses: 2+ and symmetric  Skin: Skin color, texture, turgor normal, no rashes or lesions     Neurologic: Normal    Assessment Right distal radius displaced comminuted fracture   Plan Right distal radius open reduction and internal fixation  with repair as indicated   WE ARE PLANNING SURGERY FOR YOUR UPPER EXTREMITY. THE RISKS AND BENEFITS OF SURGERY INCLUDE BUT NOT LIMITED TO BLEEDING INFECTION, DAMAGE TO NEARBY NERVES ARTERIES TENDONS, FAILURE OF SURGERY TO ACCOMPLISH ITS INTENDED GOALS, PERSISTENT SYMPTOMS AND NEED FOR FURTHER SURGICAL INTERVENTION. WITH THIS IN MIND WE WILL PROCEED. I HAVE DISCUSSED WITH THE PATIENT THE PRE AND POSTOPERATIVE REGIMEN AND THE DOS AND DON'TS. PT VOICED UNDERSTANDING AND INFORMED CONSENT SIGNED.  R/B/A DISCUSSED WITH PT IN OFFICE.  PT VOICED UNDERSTANDING OF PLAN CONSENT SIGNED DAY OF SURGERY PT SEEN AND EXAMINED PRIOR TO OPERATIVE PROCEDURE/DAY OF SURGERY SITE MARKED. QUESTIONS ANSWERED WILL [facility] FOLLOWING SURGERY   [name] 02/28/2018, 3:53 PM

## 2018-02-28 NOTE — Progress Notes (Signed)
Unable to reach patient. Left message on temporary phone number and gave instructions on voicemail. Notified patient to arrive at 0830 tomorrow.

## 2018-03-01 ENCOUNTER — Ambulatory Visit (HOSPITAL_COMMUNITY): Payer: No Typology Code available for payment source | Admitting: Certified Registered Nurse Anesthetist

## 2018-03-01 ENCOUNTER — Encounter (HOSPITAL_COMMUNITY)
Admission: RE | Disposition: A | Discharge status: Home / Self Care | Payer: Self-pay | Source: Home / Self Care | Attending: Orthopedic Surgery

## 2018-03-01 ENCOUNTER — Encounter (HOSPITAL_COMMUNITY): Payer: Self-pay

## 2018-03-01 ENCOUNTER — Other Ambulatory Visit: Payer: Self-pay

## 2018-03-01 ENCOUNTER — Ambulatory Visit (HOSPITAL_COMMUNITY)
Admission: RE | Admit: 2018-03-01 | Discharge: 2018-03-01 | Disposition: A | Discharge status: Home / Self Care | Payer: No Typology Code available for payment source | Attending: Orthopedic Surgery | Admitting: Orthopedic Surgery

## 2018-03-01 DIAGNOSIS — S52501A Unspecified fracture of the lower end of right radius, initial encounter for closed fracture: Secondary | ICD-10-CM

## 2018-03-01 DIAGNOSIS — W19XXXA Unspecified fall, initial encounter: Secondary | ICD-10-CM | POA: Diagnosis not present

## 2018-03-01 DIAGNOSIS — S52571A Other intraarticular fracture of lower end of right radius, initial encounter for closed fracture: Secondary | ICD-10-CM | POA: Insufficient documentation

## 2018-03-01 HISTORY — PX: OPEN REDUCTION INTERNAL FIXATION (ORIF) DISTAL RADIAL FRACTURE: SHX5989

## 2018-03-01 LAB — CBC
HCT: 39 % (ref 36.0–46.0)
Hemoglobin: 13 g/dL (ref 12.0–15.0)
MCH: 30.5 pg (ref 26.0–34.0)
MCHC: 33.3 g/dL (ref 30.0–36.0)
MCV: 91.5 fL (ref 80.0–100.0)
Platelets: 310 10*3/uL (ref 150–400)
RBC: 4.26 MIL/uL (ref 3.87–5.11)
RDW: 14.1 % (ref 11.5–15.5)
WBC: 7.6 10*3/uL (ref 4.0–10.5)
nRBC: 0 % (ref 0.0–0.2)

## 2018-03-01 LAB — BASIC METABOLIC PANEL
Anion gap: 9 (ref 5–15)
BUN: 11 mg/dL (ref 6–20)
CO2: 21 mmol/L — ABNORMAL LOW (ref 22–32)
Calcium: 8.4 mg/dL — ABNORMAL LOW (ref 8.9–10.3)
Chloride: 109 mmol/L (ref 98–111)
Creatinine, Ser: 0.8 mg/dL (ref 0.44–1.00)
GFR calc Af Amer: >60 mL/min (ref >60)
GFR calc non Af Amer: >60 mL/min (ref >60)
Glucose, Bld: 90 mg/dL (ref 70–99)
Potassium: 4 mmol/L (ref 3.5–5.1)
SODIUM: 139 mmol/L (ref 135–145)

## 2018-03-01 SURGERY — OPEN REDUCTION INTERNAL FIXATION (ORIF) DISTAL RADIUS FRACTURE
Anesthesia: General | Laterality: Right

## 2018-03-01 MED ORDER — PHENYLEPHRINE 40 MCG/ML (10ML) SYRINGE FOR IV PUSH (FOR BLOOD PRESSURE SUPPORT)
PREFILLED_SYRINGE | INTRAVENOUS | Status: DC | PRN
  Administered 2018-03-01: 80 ug via INTRAVENOUS
  Administered 2018-03-01: 120 ug via INTRAVENOUS
  Administered 2018-03-01 (×2): 80 ug via INTRAVENOUS

## 2018-03-01 MED ORDER — ROPIVACAINE HCL 7.5 MG/ML IJ SOLN
INTRAMUSCULAR | Status: DC | PRN
  Administered 2018-03-01: 25 mL via PERINEURAL

## 2018-03-01 MED ORDER — FENTANYL CITRATE (PF) 100 MCG/2ML IJ SOLN
INTRAMUSCULAR | Status: DC | PRN
  Administered 2018-03-01 (×3): 50 ug via INTRAVENOUS

## 2018-03-01 MED ORDER — PROPOFOL 10 MG/ML IV BOLUS
INTRAVENOUS | Status: AC
  Filled 2018-03-01: qty 20

## 2018-03-01 MED ORDER — MIDAZOLAM HCL 2 MG/2ML IJ SOLN
INTRAMUSCULAR | Status: AC
  Administered 2018-03-01: 2 mg
  Filled 2018-03-01: qty 2

## 2018-03-01 MED ORDER — ONDANSETRON HCL 4 MG/2ML IJ SOLN
INTRAMUSCULAR | Status: DC | PRN
  Administered 2018-03-01: 4 mg via INTRAVENOUS

## 2018-03-01 MED ORDER — 0.9 % SODIUM CHLORIDE (POUR BTL) OPTIME
TOPICAL | Status: DC | PRN
  Administered 2018-03-01: 1000 mL

## 2018-03-01 MED ORDER — BACITRACIN ZINC 500 UNIT/GM EX OINT
TOPICAL_OINTMENT | CUTANEOUS | Status: AC
  Filled 2018-03-01: qty 28.35

## 2018-03-01 MED ORDER — PROPOFOL 10 MG/ML IV BOLUS
INTRAVENOUS | Status: DC | PRN
  Administered 2018-03-01 (×2): 200 mg via INTRAVENOUS

## 2018-03-01 MED ORDER — FENTANYL CITRATE (PF) 100 MCG/2ML IJ SOLN
INTRAMUSCULAR | Status: AC
  Administered 2018-03-01: 50 ug
  Filled 2018-03-01: qty 2

## 2018-03-01 MED ORDER — CHLORHEXIDINE GLUCONATE 4 % EX LIQD: 60.0000 mL | Freq: Once | CUTANEOUS | Status: DC

## 2018-03-01 MED ORDER — LACTATED RINGERS IV SOLN
INTRAVENOUS | Status: DC
  Administered 2018-03-01: 11:00:00 via INTRAVENOUS

## 2018-03-01 MED ORDER — DEXAMETHASONE SODIUM PHOSPHATE 10 MG/ML IJ SOLN
INTRAMUSCULAR | Status: DC | PRN
  Administered 2018-03-01: 10 mg via INTRAVENOUS

## 2018-03-01 MED ORDER — CEFAZOLIN SODIUM-DEXTROSE 2-4 GM/100ML-% IV SOLN
INTRAVENOUS | Status: AC
  Filled 2018-03-01: qty 100

## 2018-03-01 MED ORDER — DEXAMETHASONE SODIUM PHOSPHATE 10 MG/ML IJ SOLN
INTRAMUSCULAR | Status: AC
  Filled 2018-03-01: qty 1

## 2018-03-01 MED ORDER — CEFAZOLIN SODIUM-DEXTROSE 2-4 GM/100ML-% IV SOLN
2.0000 g | INTRAVENOUS | Status: AC
  Administered 2018-03-01: 2 g via INTRAVENOUS

## 2018-03-01 MED ORDER — FENTANYL CITRATE (PF) 250 MCG/5ML IJ SOLN
INTRAMUSCULAR | Status: AC
  Filled 2018-03-01: qty 5

## 2018-03-01 MED ORDER — MIDAZOLAM HCL 2 MG/2ML IJ SOLN
INTRAMUSCULAR | Status: AC
  Filled 2018-03-01: qty 2

## 2018-03-01 MED ORDER — CLONIDINE HCL (ANALGESIA) 100 MCG/ML EP SOLN
EPIDURAL | Status: DC | PRN
  Administered 2018-03-01: 100 ug

## 2018-03-01 MED ORDER — BUPIVACAINE HCL (PF) 0.25 % IJ SOLN
INTRAMUSCULAR | Status: AC
  Filled 2018-03-01: qty 30

## 2018-03-01 MED ORDER — LIDOCAINE 2% (20 MG/ML) 5 ML SYRINGE
INTRAMUSCULAR | Status: DC | PRN
  Administered 2018-03-01: 100 mg via INTRAVENOUS

## 2018-03-01 SURGICAL SUPPLY — 59 items
BANDAGE ACE 3X5.8 VEL STRL LF (GAUZE/BANDAGES/DRESSINGS) ×2 IMPLANT
BANDAGE ACE 4X5 VEL STRL LF (GAUZE/BANDAGES/DRESSINGS) ×4 IMPLANT
BIT DRILL 2.2 SS TIBIAL (BIT) ×2 IMPLANT
BLADE CLIPPER SURG (BLADE) IMPLANT
BNDG ESMARK 4X9 LF (GAUZE/BANDAGES/DRESSINGS) ×2 IMPLANT
BNDG GAUZE ELAST 4 BULKY (GAUZE/BANDAGES/DRESSINGS) ×2 IMPLANT
CANISTER SUCT 3000ML PPV (MISCELLANEOUS) ×2 IMPLANT
CORD BI POLAR (MISCELLANEOUS) ×2 IMPLANT
COVER SURGICAL LIGHT HANDLE (MISCELLANEOUS) IMPLANT
COVER WAND RF STERILE (DRAPES) ×2 IMPLANT
CUFF TOURNIQUET SINGLE 18IN (TOURNIQUET CUFF) IMPLANT
CUFF TOURNIQUET SINGLE 24IN (TOURNIQUET CUFF) ×2 IMPLANT
DECANTER SPIKE VIAL GLASS SM (MISCELLANEOUS) ×2 IMPLANT
DRAPE OEC MINIVIEW 54X84 (DRAPES) ×2 IMPLANT
DRAPE SURG 17X11 SM STRL (DRAPES) ×2 IMPLANT
DRSG ADAPTIC 3X8 NADH LF (GAUZE/BANDAGES/DRESSINGS) ×2 IMPLANT
GAUZE 4X4 16PLY RFD (DISPOSABLE) ×2 IMPLANT
GAUZE SPONGE 4X4 12PLY STRL (GAUZE/BANDAGES/DRESSINGS) ×2 IMPLANT
GAUZE XEROFORM 5X9 LF (GAUZE/BANDAGES/DRESSINGS) ×2 IMPLANT
GLOVE BIOGEL PI IND STRL 8.5 (GLOVE) ×1 IMPLANT
GLOVE BIOGEL PI INDICATOR 8.5 (GLOVE) ×1
GLOVE SURG ORTHO 8.0 STRL STRW (GLOVE) ×2 IMPLANT
GOWN STRL REUS W/ TWL LRG LVL3 (GOWN DISPOSABLE) ×2 IMPLANT
GOWN STRL REUS W/ TWL XL LVL3 (GOWN DISPOSABLE) ×1 IMPLANT
GOWN STRL REUS W/TWL LRG LVL3 (GOWN DISPOSABLE) ×2
GOWN STRL REUS W/TWL XL LVL3 (GOWN DISPOSABLE) ×1
K-WIRE 1.6 (WIRE) ×1
K-WIRE FX5X1.6XNS BN SS (WIRE) ×1
KIT BASIN OR (CUSTOM PROCEDURE TRAY) ×2 IMPLANT
KIT TURNOVER KIT B (KITS) ×2 IMPLANT
KWIRE FX5X1.6XNS BN SS (WIRE) ×1 IMPLANT
NEEDLE HYPO 25X1 1.5 SAFETY (NEEDLE) ×2 IMPLANT
NS IRRIG 1000ML POUR BTL (IV SOLUTION) ×2 IMPLANT
PACK ORTHO EXTREMITY (CUSTOM PROCEDURE TRAY) ×2 IMPLANT
PAD ARMBOARD 7.5X6 YLW CONV (MISCELLANEOUS) ×4 IMPLANT
PAD CAST 4YDX4 CTTN HI CHSV (CAST SUPPLIES) IMPLANT
PADDING CAST COTTON 4X4 STRL (CAST SUPPLIES)
PEG LOCKING SMOOTH 2.2X18 (Peg) ×4 IMPLANT
PEG LOCKING SMOOTH 2.2X20 (Screw) ×4 IMPLANT
PEG LOCKING SMOOTH 2.2X22 (Screw) ×6 IMPLANT
PLATE DVR CROSSLOCK STD RT (Plate) ×2 IMPLANT
SCREW LOCK 14X2.7X 3 LD TPR (Screw) ×3 IMPLANT
SCREW LOCK 16X2.7X 3 LD TPR (Screw) ×1 IMPLANT
SCREW LOCKING 2.7X14 (Screw) ×3 IMPLANT
SCREW LOCKING 2.7X15MM (Screw) ×2 IMPLANT
SCREW LOCKING 2.7X16 (Screw) ×1 IMPLANT
SOAP 2 % CHG 4 OZ (WOUND CARE) ×2 IMPLANT
SPLINT FIBERGLASS 3X35 (CAST SUPPLIES) ×2 IMPLANT
SPONGE LAP 4X18 RFD (DISPOSABLE) ×2 IMPLANT
SUT PROLENE 4 0 PS 2 18 (SUTURE) ×2 IMPLANT
SUT VIC AB 2-0 CT1 27 (SUTURE)
SUT VIC AB 2-0 CT1 TAPERPNT 27 (SUTURE) IMPLANT
SUT VICRYL 4-0 PS2 18IN ABS (SUTURE) ×2 IMPLANT
SYR CONTROL 10ML LL (SYRINGE) ×2 IMPLANT
TOWEL NATURAL 10PK STERILE (DISPOSABLE) ×2 IMPLANT
TOWEL NATURAL 6PK STERILE (DISPOSABLE) IMPLANT
TUBE CONNECTING 12X1/4 (SUCTIONS) ×2 IMPLANT
WATER STERILE IRR 1000ML POUR (IV SOLUTION) ×2 IMPLANT
YANKAUER SUCT BULB TIP NO VENT (SUCTIONS) ×2 IMPLANT

## 2018-03-01 NOTE — Transfer of Care (Signed)
Immediate Anesthesia Transfer of Care Note  Patient: [name]  Procedure(s) Performed: OPEN REDUCTION INTERNAL FIXATION (ORIF) DISTAL RADIAL FRACTURE (Right )  Patient Location: PACU  Anesthesia Type:General  Level of Consciousness: awake, drowsy and patient cooperative  Airway & Oxygen Therapy: Patient Spontanous Breathing and Patient connected to nasal cannula oxygen  Post-op Assessment: Report given to RN and Post -op Vital signs reviewed and stable  Post vital signs: Reviewed and stable  Last Vitals:  Vitals Value Taken Time  BP 131/63 03/01/2018 12:56 PM  Temp    Pulse 90 03/01/2018 12:56 PM  Resp 13 03/01/2018 12:56 PM  SpO2 100 % 03/01/2018 12:56 PM  Vitals shown include unvalidated device data.  Last Pain:  Vitals:   03/01/18 1255  TempSrc:   PainSc: (P) 0-No pain      Patients Stated Pain Goal: 3 (79/03/83 3383)  Complications: No apparent anesthesia complications

## 2018-03-01 NOTE — Op Note (Signed)
PREOPERATIVE DIAGNOSIS:right wrist intra-articular distal radius fracture 3 more fragments  POSTOPERATIVE DIAGNOSIS:same  ATTENDING SURGEON:Dr. Iran Planas who was scrubbed and present for the entire procedure  ASSISTANT SURGEON:none  ANESTHESIA:regional block with general anesthesia via LMA  OPERATIVE PROCEDURE: #1: Open treatment of right wrist intra-articular distal radius fracture 3 more fragments #2: Right wrist brachioradialis tendon release, tendon tenotomy #3: Radiographs 3 views right wrist  IMPLANTS:[employer] DVR standard cross lock  RADIOGRAPHIC INTERPRETATION:AP lateral oblique views the wrist to show the volar plate fixation place in good position with good restoration of radial height inclination and tilt  SURGICAL INDICATIONS:patient is a right-hand-dominant female sustained a closed injury to her right wrist. Patient seen and evaluated in the office and recommended undergo the above procedure. Risks benefits and alternatives were discussed in detail the patient and signed informed consent was obtained. Risks include but not limited to bleeding infection damage to nearby nerves arteries or tendons nonunion malunion hardware failure loss of motion of wrists and digits incomplete relief of symptoms and need for further surgical intervention.  SURGICAL TECHNIQUE:patient operative on the preoperative holding area marked for permanent marker made on the right wrist indicate the correct operative site. Patient brought back to operating room placed supine on anesthesia and table general anesthesia was administered. Patient tolerated this well. Preoperative antibiotics were given prior to any skin incision. A well-padded tourniquet was then placed on the right brachium say with the appropriate drape. The right M is and prepped and draped in normal sterile fashion. A timeout was called the correct site was identified procedure then begun. Attention was then turned to the right wrist. The  limb was then elevated and the tourniquet insufflated. A longitudinal incision made directly over the FCR sheath. Dissection carried down through the skin and subcutaneous tissue. The FCR sheath was then opened proximally and distally. Going through the floor the FCR sheath the FPL was swept out of the way pronator quadratus was then elevated. Fracture site was then exposed. The patient did have a comminuted intra-articular fracture. The radial column was then released with release of the brachia radialis. Tendon tenotomy was then carried out of the brachia radialis releasing the radial side exposing the intra-articular fragments. Open reduction was then performed. After open reduction was then performed fracture hematoma then evacuated. The volar plate was then applied and held distally with a K wire. Plate position was then confirmed using the mini C-arm. The oblong screw hole was then used proximally. Following this distal fixation was then carried out from an ulnar to radial direction with the distal locking pegs. Thorough wound irrigation done throughout removing any loose fragments. Following this the shaft fixation was then carried out with accommodation of locking and nonlocking screws. Final radiographs were then obtained. The pronator quadratus was then closed with 2-0 Vicryl. Subcutaneous tissues closed with 4-0 Vicryl skin was then closed using simple 4-0 Prolene. Adaptic dressing and a sterile compressive bandage then applied. The patient is then placed in well-padded sugar tong splint explained taken recovery room in good condition.  POSTOPERATIVE PLAN:patient discharged to home. Seen back in the office in 2 weeks for wound check suture removal x-rays application a short arm cast right a therapy order for the four-week mark begin a therapy regimen at the four-week mark. Radiographs at each visit.

## 2018-03-01 NOTE — Discharge Instructions (Addendum)
KEEP BANDAGE CLEAN AND DRY CALL OFFICE FOR F/U APPT [phone] RX SENT TO [facility] [address] Caralyn Guile [phone] KEEP HAND ELEVATED ABOVE HEART OK TO APPLY ICE TO OPERATIVE AREA CONTACT OFFICE IF ANY WORSENING PAIN OR CONCERNS.

## 2018-03-01 NOTE — Anesthesia Procedure Notes (Addendum)
Anesthesia Regional Block: Supraclavicular block   Pre-Anesthetic Checklist: ,, timeout performed, Correct Patient, Correct Site, Correct Laterality, Correct Procedure, Correct Position, site marked, Risks and benefits discussed,  Surgical consent,  Pre-op evaluation,  At surgeon's request and post-op pain management  Laterality: Right  Prep: chloraprep       Needles:  Injection technique: Single-shot  Needle Type: Echogenic Stimulator Needle     Needle Length: 5cm  Needle Gauge: 22     Additional Needles:   Procedures:, nerve stimulator,,, ultrasound used (permanent image in chart),,,,   Nerve Stimulator or Paresthesia:  Response: delt, 0.45 mA,   Additional Responses:   Narrative:  Start time: 03/01/2018 11:05 AM End time: 03/01/2018 11:15 AM Injection made incrementally with aspirations every 5 mL.  Performed by: Personally  Anesthesiologist: Janeece Riggers, MD  Additional Notes: Functioning IV was confirmed and monitors were applied.  A 62mm 22ga Arrow echogenic stimulator needle was used. Sterile prep and drape,hand hygiene and sterile gloves were used. Ultrasound guidance: relevant anatomy identified, needle position confirmed, local anesthetic spread visualized around nerve(s)., vascular puncture avoided.  Image printed for medical record. Negative aspiration and negative test dose prior to incremental administration of local anesthetic. The patient tolerated the procedure well.

## 2018-03-01 NOTE — Anesthesia Preprocedure Evaluation (Signed)
Anesthesia Evaluation  Patient identified by MRN, date of birth, ID band Patient awake    Reviewed: Allergy & Precautions, NPO status , Patient's Chart, lab work & pertinent test results  Airway Mallampati: II  TM Distance: >3 FB     Dental  (+) Teeth Intact, Dental Advisory Given   Pulmonary neg pulmonary ROS,    breath sounds clear to auscultation       Cardiovascular + dysrhythmias Supra Ventricular Tachycardia  Rhythm:Regular Rate:Normal     Neuro/Psych  Headaches, PSYCHIATRIC DISORDERS Anxiety    GI/Hepatic GERD  Controlled,  Endo/Other    Renal/GU      Musculoskeletal   Abdominal   Peds  Hematology   Anesthesia Other Findings   Reproductive/Obstetrics                             Anesthesia Physical  Anesthesia Plan  ASA: II  Anesthesia Plan: General   Post-op Pain Management: GA combined w/ Regional for post-op pain   Induction: Intravenous  PONV Risk Score and Plan: 3 and Ondansetron, Dexamethasone, Treatment may vary due to age or medical condition and Midazolam  Airway Management Planned: LMA  Additional Equipment:   Intra-op Plan:   Post-operative Plan: Extubation in OR  Informed Consent: I have reviewed the patients History and Physical, chart, labs and discussed the procedure including the risks, benefits and alternatives for the proposed anesthesia with the patient or authorized representative who has indicated his/her understanding and acceptance.     Dental advisory given  Plan Discussed with: CRNA, Anesthesiologist and Surgeon  Anesthesia Plan Comments: (Discussed both nerve block for pain relief post-op and GA; including NV, sore throat, dental injury, and pulmonary complications)        Anesthesia Quick Evaluation

## 2018-03-01 NOTE — Anesthesia Postprocedure Evaluation (Signed)
Anesthesia Post Note  Patient: [name]  Procedure(s) Performed: OPEN REDUCTION INTERNAL FIXATION (ORIF) DISTAL RADIAL FRACTURE (Right )     Patient location during evaluation: PACU Anesthesia Type: General Level of consciousness: awake and alert Pain management: pain level controlled Vital Signs Assessment: post-procedure vital signs reviewed and stable Respiratory status: spontaneous breathing, nonlabored ventilation, respiratory function stable and patient connected to nasal cannula oxygen Cardiovascular status: blood pressure returned to baseline and stable Postop Assessment: no apparent nausea or vomiting Anesthetic complications: no    Last Vitals:  Vitals:   03/01/18 1310 03/01/18 1321  BP: 130/60 121/66  Pulse: 79 74  Resp: 12 13  Temp:  36.5 C  SpO2: 99% 100%    Last Pain:  Vitals:   03/01/18 1321  TempSrc:   PainSc: 0-No pain                 Carolyna Yerian

## 2018-03-01 NOTE — Anesthesia Procedure Notes (Signed)
Procedure Name: LMA Insertion Date/Time: 03/01/2018 11:36 AM Performed by: Lowella Dell, CRNA Patient Re-evaluated:Patient Re-evaluated prior to induction Oxygen Delivery Method: Circle system utilized Preoxygenation: Pre-oxygenation with 100% oxygen Induction Type: IV induction Ventilation: Mask ventilation without difficulty and Oral airway inserted - appropriate to patient size LMA: LMA inserted LMA Size: 4.0 Number of attempts: 2 (1st attempt, LMA seated suboptimally. Removed and replaced without difficulty.) Airway Equipment and Method: Oral airway Placement Confirmation: positive ETCO2 Tube secured with: Tape Dental Injury: Teeth and Oropharynx as per pre-operative assessment

## 2018-03-02 NOTE — ED Provider Notes (Signed)
[place]   [id number] 02/27/18 Arrival Time: [place]:  1. Closed Colles' fracture of right radius, initial encounter    I have personally viewed the imaging studies ordered this visit. Displaced distal radius fracture.  Imaging: Dg Wrist Complete Right  Result Date: 02/27/2018 CLINICAL DATA:  Wrist deformity EXAM: RIGHT WRIST - COMPLETE 3+ VIEW COMPARISON:  None. FINDINGS: Acute comminuted and impacted intra-articular distal radius fracture with close to 1/2 bone with dorsal displacement of distal fracture fragment. No subluxation. IMPRESSION: Acute comminuted and dorsally displaced intra-articular distal radius fracture. Electronically Signed   By: Donavan Foil M.D.   On: 02/27/2018 19:41   Orders Placed This Encounter  Procedures  . Apply splint short arm; applied by orthopaedic tech  . Apply sling arm foam   Meds ordered this encounter  Medications  . HYDROcodone-acetaminophen (NORCO/VICODIN) 5-325 MG tablet    Sig: Take 1 tablet by mouth every 6 (six) hours as needed for moderate pain or severe pain.    Dispense:  12 tablet    Refill:  0  . HYDROcodone-acetaminophen (NORCO/VICODIN) 5-325 MG per tablet 1 tablet   [place] Controlled Substances Registry consulted for this patient. I feel the risk/benefit ratio today is favorable for proceeding with this prescription for a controlled substance. Medication sedation precautions given.  Follow-up Information    Schedule an appointment as soon as possible for a visit  with Roseanne Kaufman, MD.   Specialty:  Orthopedic Surgery Contact information: [address] [place] [address] [phone]          OTC analgesics as needed. Written information on fracture and splint care given. To arrange orthopaedic follow up within one week. May f/u here as needed.  Reviewed expectations re: course of current medical issues. Questions answered. Outlined signs and symptoms indicating need for  more acute intervention. Patient verbalized understanding. After Visit Summary given.  SUBJECTIVE: History from: patient. [name] is a 53 y.o. female who reports persistent pain of her right wrist with swelling. Onset abrupt beginning a few hours ago. Injury/trama: yes, reports [place] while walking her dog. Discomfort described as aching without radiation. Symptoms have progressed to a point and plateaued since beginning. More pain with wrist movement. Better when holding still. Extremity sensation changes or weakness: none. Self treatment: tried OTCs without relief of pain. She is right handed.  ROS: As per HPI. All other systems negative.  OBJECTIVE:  Vitals:   02/27/18 1904  BP: 133/64  Pulse: 79  Resp: 18  Temp: 98.7 F (37.1 C)  TempSrc: Oral  SpO2: 98%    General appearance: alert; no distress HEENT: [place]; AT Neck: supple with FROM Lungs: unlabored respirations Extremities: . RUE: warm and well perfused; localized marked tenderness over right radial wrist; with gross deformity over distal radius; with mild swelling; with no bruising; ROM: limited by pain CV: brisk extremity capillary refill of RUE; 2+ radial pulse of RUE Skin: warm and dry Neurologic: gait normal; normal symmetric reflexes of RUE and LUE; normal sensation of RUE and LUE Psychological: alert and cooperative; normal mood and affect   Allergies  Allergen Reactions  . Sulfonamide Derivatives Rash    Past Medical History:  Diagnosis Date  . Anemia   . Anxiety state, unspecified   . GERD (gastroesophageal reflux disease)   . Headache(784.0)   . Other constipation   . SVT (supraventricular tachycardia) (HCC)    Social History   Socioeconomic History  . Marital status:  Married    Spouse name: [name]  . Number of children: 4  . Years of education: 69  . Highest education level: Not on file  Occupational History  . Occupation: [employer]     Employer: [place]:  [employer]     Employer: [employer]  Social Needs  . Financial resource strain: Not on file  . Food insecurity:    Worry: Not on file    Inability: Not on file  . Transportation needs:    Medical: Not on file    Non-medical: Not on file  Tobacco Use  . Smoking status: Never Smoker  . Smokeless tobacco: Never Used  Substance and Sexual Activity  . Alcohol use: No    Alcohol/week: 0.0 standard drinks  . Drug use: No  . Sexual activity: Not Currently    Birth control/protection: Surgical  Lifestyle  . Physical activity:    Days per week: Not on file    Minutes per session: Not on file  . Stress: Not on file  Relationships  . Social connections:    Talks on phone: Not on file    Gets together: Not on file    Attends religious service: Not on file    Active member of club or organization: Not on file    Attends meetings of clubs or organizations: Not on file    Relationship status: Not on file  . Intimate partner violence:    Fear of current or ex partner: Not on file    Emotionally abused: Not on file    Physically abused: Not on file    Forced sexual activity: Not on file  Other Topics Concern  . Not on file  Social History Narrative   Patient lives with her husband [name] ) and her children.   Patient has four children.   Patient works full-time.   Patient has a college education.   Patient is right-handed.   Patient drinks caffeine- once or twice a week.     Family History  Problem Relation Age of Onset  . Deep vein thrombosis Mother        cerebral blood clot   . Heart attack Mother   . Heart failure Father   . Diabetes Maternal Grandmother   . Stroke Maternal Grandmother   . Cancer Maternal Grandfather        Prostate   . Depression Maternal Uncle   . Hypertension Neg Hx   . Colon cancer Neg Hx   . Breast cancer Neg Hx    Past Surgical History:  Procedure Laterality Date  . CARDIAC CATHETERIZATION  12/09/03   Normal  . CERVICAL DISC  SURGERY     Ruptured disc repair / and approach c5/6, c6/7; plate  . HERNIA REPAIR     umbilical  . LAPAROSCOPIC ASSISTED VAGINAL HYSTERECTOMY N/A 03/09/2015   Procedure: LAPAROSCOPIC ASSISTED VAGINAL HYSTERECTOMY;  Surgeon: Jonnie Kind, MD;  Location: [facility];  Service: Gynecology;  Laterality: N/A;  . RECTOCELE REPAIR N/A 03/09/2015   Procedure: POSTERIOR REPAIR (RECTOCELE);  Surgeon: Jonnie Kind, MD;  Location: [facility];  Service: Gynecology;  Laterality: N/A;  . SALPINGOOPHORECTOMY Bilateral 03/09/2015   Procedure: SALPINGO OOPHORECTOMY;  Surgeon: Jonnie Kind, MD;  Location: [facility];  Service: Gynecology;  Laterality: Bilateral;  . TUBAL LIGATION    . VAGINAL DELIVERY  90, 97, 99, 03   x 4       Constantinos Krempasky, [place],  MD 03/02/18 1001

## 2018-03-03 ENCOUNTER — Other Ambulatory Visit: Payer: Self-pay | Admitting: Obstetrics and Gynecology

## 2018-03-04 ENCOUNTER — Encounter (HOSPITAL_COMMUNITY): Payer: Self-pay | Admitting: Orthopedic Surgery

## 2018-03-19 ENCOUNTER — Other Ambulatory Visit: Payer: Self-pay | Admitting: Obstetrics and Gynecology

## 2018-03-25 ENCOUNTER — Other Ambulatory Visit: Payer: Self-pay | Admitting: Family Medicine

## 2018-03-27 NOTE — Telephone Encounter (Signed)
Estradiol 0.1 mg transdermal filled x 1 yr.

## 2018-04-17 ENCOUNTER — Other Ambulatory Visit: Payer: Self-pay | Admitting: Obstetrics and Gynecology

## 2018-04-27 ENCOUNTER — Encounter (HOSPITAL_COMMUNITY): Payer: Self-pay

## 2018-04-27 ENCOUNTER — Emergency Department (HOSPITAL_COMMUNITY)
Admission: EM | Admit: 2018-04-27 | Discharge: 2018-04-27 | Disposition: A | Discharge status: Home / Self Care | Payer: No Typology Code available for payment source | Attending: Emergency Medicine | Admitting: Emergency Medicine

## 2018-04-27 ENCOUNTER — Emergency Department (HOSPITAL_COMMUNITY): Payer: No Typology Code available for payment source

## 2018-04-27 ENCOUNTER — Other Ambulatory Visit: Payer: Self-pay

## 2018-04-27 DIAGNOSIS — R197 Diarrhea, unspecified: Secondary | ICD-10-CM | POA: Insufficient documentation

## 2018-04-27 DIAGNOSIS — R0789 Other chest pain: Secondary | ICD-10-CM

## 2018-04-27 DIAGNOSIS — Z79899 Other long term (current) drug therapy: Secondary | ICD-10-CM | POA: Diagnosis not present

## 2018-04-27 DIAGNOSIS — I10 Essential (primary) hypertension: Secondary | ICD-10-CM | POA: Insufficient documentation

## 2018-04-27 DIAGNOSIS — K2970 Gastritis, unspecified, without bleeding: Secondary | ICD-10-CM | POA: Diagnosis not present

## 2018-04-27 DIAGNOSIS — R079 Chest pain, unspecified: Secondary | ICD-10-CM | POA: Diagnosis present

## 2018-04-27 HISTORY — DX: Essential (primary) hypertension: I10

## 2018-04-27 LAB — COMPREHENSIVE METABOLIC PANEL
ALBUMIN: 3.3 g/dL — AB (ref 3.5–5.0)
ALT: 20 U/L (ref 0–44)
AST: 22 U/L (ref 15–41)
Alkaline Phosphatase: 71 U/L (ref 38–126)
Anion gap: 10 (ref 5–15)
BUN: 13 mg/dL (ref 6–20)
CO2: 19 mmol/L — ABNORMAL LOW (ref 22–32)
Calcium: 8.4 mg/dL — ABNORMAL LOW (ref 8.9–10.3)
Chloride: 105 mmol/L (ref 98–111)
Creatinine, Ser: 0.83 mg/dL (ref 0.44–1.00)
GFR calc Af Amer: >60 mL/min (ref >60)
GFR calc non Af Amer: >60 mL/min (ref >60)
Glucose, Bld: 115 mg/dL — ABNORMAL HIGH (ref 70–99)
Potassium: 3.3 mmol/L — ABNORMAL LOW (ref 3.5–5.1)
Sodium: 134 mmol/L — ABNORMAL LOW (ref 135–145)
Total Bilirubin: 0.5 mg/dL (ref 0.3–1.2)
Total Protein: 6.2 g/dL — ABNORMAL LOW (ref 6.5–8.1)

## 2018-04-27 LAB — TROPONIN I
Troponin I: <0.03 ng/mL (ref <0.03)
Troponin I: <0.03 ng/mL (ref <0.03)

## 2018-04-27 LAB — CBC WITH DIFFERENTIAL/PLATELET
Abs Immature Granulocytes: 0.03 10*3/uL (ref 0.00–0.07)
BASOS ABS: 0 10*3/uL (ref 0.0–0.1)
Basophils Relative: 0 %
Eosinophils Absolute: 0 10*3/uL (ref 0.0–0.5)
Eosinophils Relative: 0 %
HCT: 39.7 % (ref 36.0–46.0)
Hemoglobin: 13 g/dL (ref 12.0–15.0)
Immature Granulocytes: 0 %
Lymphocytes Relative: 42 %
Lymphs Abs: 3.6 10*3/uL (ref 0.7–4.0)
MCH: 29.3 pg (ref 26.0–34.0)
MCHC: 32.7 g/dL (ref 30.0–36.0)
MCV: 89.6 fL (ref 80.0–100.0)
Monocytes Absolute: 0.4 10*3/uL (ref 0.1–1.0)
Monocytes Relative: 5 %
Neutro Abs: 4.4 10*3/uL (ref 1.7–7.7)
Neutrophils Relative %: 53 %
Platelets: 352 10*3/uL (ref 150–400)
RBC: 4.43 MIL/uL (ref 3.87–5.11)
RDW: 13.5 % (ref 11.5–15.5)
WBC: 8.6 10*3/uL (ref 4.0–10.5)
nRBC: 0 % (ref 0.0–0.2)

## 2018-04-27 LAB — LIPASE, BLOOD: LIPASE: 27 U/L (ref 11–51)

## 2018-04-27 MED ORDER — DICYCLOMINE HCL 20 MG PO TABS: 20.0000 mg | ORAL_TABLET | Freq: Two times a day (BID) | ORAL | 0 refills | Status: DC

## 2018-04-27 MED ORDER — SODIUM CHLORIDE 0.9 % IV BOLUS
500.0000 mL | Freq: Once | INTRAVENOUS | Status: AC
  Administered 2018-04-27: 500 mL via INTRAVENOUS

## 2018-04-27 MED ORDER — ONDANSETRON 4 MG PO TBDP: 4.0000 mg | ORAL_TABLET | Freq: Three times a day (TID) | ORAL | 0 refills | Status: DC | PRN

## 2018-04-27 MED ORDER — PROMETHAZINE HCL 25 MG/ML IJ SOLN
12.5000 mg | Freq: Once | INTRAMUSCULAR | Status: AC
  Administered 2018-04-27: 12.5 mg via INTRAVENOUS
  Filled 2018-04-27: qty 1

## 2018-04-27 MED ORDER — DICYCLOMINE HCL 10 MG/ML IM SOLN
20.0000 mg | Freq: Once | INTRAMUSCULAR | Status: AC
  Administered 2018-04-27: 20 mg via INTRAMUSCULAR
  Filled 2018-04-27: qty 2

## 2018-04-27 MED ORDER — FAMOTIDINE 20 MG IN NS 100 ML IVPB
20.0000 mg | Freq: Once | INTRAVENOUS | Status: AC
  Administered 2018-04-27: 20 mg via INTRAVENOUS
  Filled 2018-04-27: qty 100

## 2018-04-27 MED ORDER — POTASSIUM CHLORIDE CRYS ER 20 MEQ PO TBCR
40.0000 meq | EXTENDED_RELEASE_TABLET | Freq: Once | ORAL | Status: AC
  Administered 2018-04-27: 40 meq via ORAL
  Filled 2018-04-27: qty 2

## 2018-04-27 NOTE — ED Triage Notes (Signed)
Pt arrives with [facility] c/o substernal CP that radiates to left arm that started this morning at 0500. Pt was given 324 aspirin, 2 tabs nitroglycerin SL, and 4 mg zofran en route by EMS. Pt has hx of RBBB.   EMS vitals: 118/67 BP 100% RA Temp 98.1 CBG 115 HR 90-104

## 2018-04-27 NOTE — ED Notes (Signed)
Patient verbalizes understanding of discharge instructions. Opportunity for questioning and answers were provided. Armband removed by staff, pt discharged from ED.  

## 2018-04-27 NOTE — Discharge Instructions (Signed)
Take your Nexium as needed to help with your reflux symptoms.  Take Zofran as needed for nausea.  Bentyl will help with abdominal discomfort as well. Follow-up with your primary care provider. Return to the ED for worsening symptoms, worsening chest pain, shortness of breath, vomiting or coughing up blood, blood in your stool.

## 2018-04-27 NOTE — ED Notes (Signed)
Lipase, CMP, and Troponin labs hemolyzed; redrawing labs now.

## 2018-04-27 NOTE — ED Notes (Signed)
ED Provider at bedside. 

## 2018-04-27 NOTE — ED Provider Notes (Signed)
[place] EMERGENCY DEPARTMENT Provider Note   CSN: [id number] Arrival date & time: 04/27/18  4098    History   Chief Complaint Chief Complaint  Patient presents with  . Chest Pain    HPI AVEA MCGOWEN is a 53 y.o. female with a past medical history of GERD, anxiety, RBBB, who presents to ED for substernal chest pain that began this morning ?3hrs prior to arrival. Patient appears to irritated, uncooperative with answering questions so history is limited as she is giving short answers.  States that she woke up from sleep to go to the bathroom when she began having burning sensation in her chest. Unsure of exact time.  Pain has improved since arrival in the ED, but states the ASA, nitroglycerin and zofran did not help.  She has had 3 episodes of nonbloody, nonbilious emesis as well as diarrhea since the burning sensation began.  No history of similar symptoms in the past.  Denies any shortness of breath, cough, URI symptoms, prior MI, DVT, PE, leg swelling, recent immobilization. She does not remember ever seeing a cardiologist or undergoing a stress test.     HPI  Past Medical History:  Diagnosis Date  . Anemia   . Anxiety state, unspecified   . GERD (gastroesophageal reflux disease)   . Headache(784.0)   . Hypertension   . Other constipation   . SVT (supraventricular tachycardia) [facility])     Patient Active Problem List   Diagnosis Date Noted  . Fatigue 11/08/2017  . Postural dizziness with presyncope 05/02/2017  . Muscle cramping 05/02/2017  . Cough 03/04/2017  . Trochanteric bursitis 01/01/2017  . Routine general medical examination at a health care facility 10/04/2015  . Rectocele 01/15/2015  . Pelvic pain in female 11/19/2013  . Paresthesia 12/05/2012  . Low back pain 09/20/2010  . GERD (gastroesophageal reflux disease) 07/28/2010  . ADJUSTMENT REACTION WITH PHYSICAL SYMPTOMS 08/10/2009  . SKIN LESION, BENIGN 11/13/2008  . ECZEMA 01/21/2008  .  CHRONIC RHINITIS 10/01/2007  . HEADACHE, CHRONIC 10/01/2007  . HYPERCHOLESTEROLEMIA 03/27/2007  . 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 CONSTIPATION, CHRONIC 03/27/2007  . ANXIETY, MILD 07/16/2006    Past Surgical History:  Procedure Laterality Date  . CARDIAC CATHETERIZATION  12/09/03   Normal  . CERVICAL DISC SURGERY     Ruptured disc repair / and approach c5/6, c6/7; plate  . HERNIA REPAIR     umbilical  . LAPAROSCOPIC ASSISTED VAGINAL HYSTERECTOMY N/A 03/09/2015   Procedure: LAPAROSCOPIC ASSISTED VAGINAL HYSTERECTOMY;  Surgeon: Jonnie Kind, MD;  Location: [facility];  Service: Gynecology;  Laterality: N/A;  . OPEN REDUCTION INTERNAL FIXATION (ORIF) DISTAL RADIAL FRACTURE Right 03/01/2018   Procedure: OPEN REDUCTION INTERNAL FIXATION (ORIF) DISTAL RADIAL FRACTURE;  Surgeon: Iran Planas, MD;  Location: [facility];  Service: Orthopedics;  Laterality: Right;  . RECTOCELE REPAIR N/A 03/09/2015   Procedure: POSTERIOR REPAIR (RECTOCELE);  Surgeon: Jonnie Kind, MD;  Location: [facility];  Service: Gynecology;  Laterality: N/A;  . SALPINGOOPHORECTOMY Bilateral 03/09/2015   Procedure: SALPINGO OOPHORECTOMY;  Surgeon: Jonnie Kind, MD;  Location: [facility];  Service: Gynecology;  Laterality: Bilateral;  . TUBAL LIGATION    . VAGINAL DELIVERY  90, 97, 99, 03   x 4      OB History    Gravida  6   Para  4   Term  3   Preterm  1   AB  2   Living  4     SAB  TAB  2   Ectopic      Multiple      Live Births               Home Medications    Prior to Admission medications   Medication Sig Start Date End Date Taking? Authorizing Provider  estradiol (CLIMARA - DOSED IN MG/24 HR) 0.1 mg/24hr patch PLACE 1 PATCH (0.1 MG TOTAL) ONTO THE SKIN EVERY MONDAY. Patient taking differently: Place 0.1 mg onto the skin every Tuesday.  04/22/18  Yes Jonnie Kind, MD  HYDROcodone-acetaminophen (NORCO/VICODIN) 5-325 MG tablet Take 1 tablet by mouth every 6 (six) hours as needed for  moderate pain or severe pain. 02/27/18  Yes Vanessa Kick, MD  ibuprofen (ADVIL,MOTRIN) 200 MG tablet Take 400 mg by mouth every 8 (eight) hours as needed for headache or moderate pain.    Yes [provider]  Multiple Vitamins-Minerals (MULTI-VITAMIN GUMMIES) CHEW Chew 2 tablets by mouth daily.  05/30/16  Yes [provider]  dicyclomine (BENTYL) 20 MG tablet Take 1 tablet (20 mg total) by mouth 2 (two) times daily. 04/27/18   Christofer Shen, PA-C  ondansetron (ZOFRAN ODT) 4 MG disintegrating tablet Take 1 tablet (4 mg total) by mouth every 8 (eight) hours as needed for nausea or vomiting. 04/27/18   Delia Heady, PA-C    Family History Family History  Problem Relation Age of Onset  . Deep vein thrombosis Mother        cerebral blood clot   . Heart attack Mother   . Heart failure Father   . Diabetes Maternal Grandmother   . Stroke Maternal Grandmother   . Cancer Maternal Grandfather        Prostate   . Depression Maternal Uncle   . Hypertension Neg Hx   . Colon cancer Neg Hx   . Breast cancer Neg Hx     Social History Social History   Tobacco Use  . Smoking status: Never Smoker  . Smokeless tobacco: Never Used  Substance Use Topics  . Alcohol use: No    Alcohol/week: 0.0 standard drinks  . Drug use: No     Allergies   Sulfonamide derivatives   Review of Systems Review of Systems  Constitutional: Negative for appetite change, chills and fever.  HENT: Negative for ear pain, rhinorrhea, sneezing and sore throat.   Eyes: Negative for photophobia and visual disturbance.  Respiratory: Negative for cough, chest tightness, shortness of breath and wheezing.   Cardiovascular: Positive for chest pain. Negative for palpitations.  Gastrointestinal: Positive for diarrhea, nausea and vomiting. Negative for abdominal pain, blood in stool and constipation.  Genitourinary: Negative for dysuria, hematuria and urgency.  Musculoskeletal: Negative for myalgias.  Skin:  Negative for rash.  Neurological: Negative for dizziness, weakness and light-headedness.     Physical Exam Updated Vital Signs BP 115/66   Pulse 65   Temp 97.6 F (36.4 C) (Oral)   Resp 17   Ht 5\' 3"  (1.6 m)   Wt 99.3 kg   LMP 01/31/2015   SpO2 99%   BMI 38.79 kg/m   Physical Exam Vitals signs and nursing note reviewed.  Constitutional:      General: She is not in acute distress.    Appearance: She is well-developed.  HENT:     Head: Normocephalic and atraumatic.     Nose: Nose normal.  Eyes:     General: No scleral icterus.       Right eye: No discharge.  Left eye: No discharge.     Conjunctiva/sclera: Conjunctivae normal.  Neck:     Musculoskeletal: Normal range of motion and neck supple.  Cardiovascular:     Rate and Rhythm: Normal rate and regular rhythm.     Heart sounds: Normal heart sounds. No murmur. No friction rub. No gallop.   Pulmonary:     Effort: Pulmonary effort is normal. No respiratory distress.     Breath sounds: Normal breath sounds.  Chest:     Chest wall: Tenderness present.    Abdominal:     General: Bowel sounds are normal. There is no distension.     Palpations: Abdomen is soft.     Tenderness: There is abdominal tenderness (Generalized). There is no guarding.  Musculoskeletal: Normal range of motion.     Comments: No lower extremity edema, erythema or calf tenderness bilaterally.  Skin:    General: Skin is warm and dry.     Findings: No rash.  Neurological:     Mental Status: She is alert.     Motor: No abnormal muscle tone.     Coordination: Coordination normal.      ED Treatments / Results  Labs (all labs ordered are listed, but only abnormal results are displayed) Labs Reviewed  COMPREHENSIVE METABOLIC PANEL - Abnormal; Notable for the following components:      Result Value   Sodium 134 (*)    Potassium 3.3 (*)    CO2 19 (*)    Glucose, Bld 115 (*)    Calcium 8.4 (*)    Total Protein 6.2 (*)    Albumin 3.3 (*)     All other components within normal limits  CBC WITH DIFFERENTIAL/PLATELET  LIPASE, BLOOD  TROPONIN I  TROPONIN I    EKG EKG Interpretation  Date/Time:  Saturday April 27 2018 07:38:21 EDT Ventricular Rate:  84 PR Interval:    QRS Duration: 169 QT Interval:  435 QTC Calculation: 515 R Axis:   -45 Text Interpretation:  Sinus rhythm Right bundle branch block No significant change since last tracing Confirmed by Gareth Morgan [phone]) on 04/27/2018 7:55:56 AM   Radiology Dg Chest 2 View  Result Date: 04/27/2018 CLINICAL DATA:  Chest pain since this morning.  Nausea and vomiting. EXAM: CHEST - 2 VIEW COMPARISON:  04/03/2017 FINDINGS: The lateral view images through lower cervical spine fixation. Mild right hemidiaphragm elevation. Midline trachea. Borderline cardiomegaly. Mediastinal contours otherwise within normal limits. No pleural effusion or pneumothorax. Clear lungs. No congestive failure. IMPRESSION: Borderline cardiomegaly, without acute disease. Electronically Signed   By: Abigail Miyamoto M.D.   On: 04/27/2018 09:03    Procedures Procedures (including critical care time)  Medications Ordered in ED Medications  sodium chloride 0.9 % bolus 500 mL (0 mLs Intravenous Stopped 04/27/18 1023)  promethazine (PHENERGAN) injection 12.5 mg (12.5 mg Intravenous Given 04/27/18 0814)  potassium chloride SA (K-DUR,KLOR-CON) CR tablet 40 mEq (40 mEq Oral Given 04/27/18 1114)  dicyclomine (BENTYL) injection 20 mg (20 mg Intramuscular Given 04/27/18 1148)  famotidine (PEPCID) IVPB 20 mg in NS 100 mL IVPB (0 mg Intravenous Stopped 04/27/18 1228)     Initial Impression / Assessment and Plan / ED Course  I have reviewed the triage vital signs and the nursing notes.  Pertinent labs & imaging results that were available during my care of the patient were reviewed by me and considered in my medical decision making (see chart for details).        53 year old female with past  medical history  of GERD, anxiety presents to ED for substernal chest pain that began this morning.  Reports associated nausea, vomiting and diarrhea.  She also reports abdominal discomfort.  Denies shortness of breath, history of MI, DVT, PE, recent mobilization, exposure to the novel coronavirus, leg swelling.  On my exam there is tenderness palpation of the chest and abdomen generalized without rebound or guarding.  Her vital signs are within normal limits.  She is not tachycardic, tachypneic or hypoxic.  CBC, CMP, lipase unremarkable.  Slight hypokalemia which was repleted orally.  EKG shows right bundle branch block which patient has a history of, no changes from prior tracings.  Initial and delta troponin are both negative.  She is low risk by her heart score of 2.  I feel that 2- troponins as well as improvement in her symptoms with Bentyl, Pepcid, antiemetics is reassuring that her pain is less likely to be cardiac.  She has no risk factors for PE and is low risk by Wells criteria.  We will have her follow-up with cardiologist who she saw 1 year ago for possible repeat stress test which she has not had in about a decade.  Patient agreeable to plan.  Vies to return to ED for any severe worsening symptoms.  Patient is hemodynamically stable, in NAD, and able to ambulate in the ED. Evaluation does not show pathology that would require ongoing emergent intervention or inpatient treatment. I explained the diagnosis to the patient. Pain has been managed and has no complaints prior to discharge. Patient is comfortable with above plan and is stable for discharge at this time. All questions were answered prior to disposition. Strict return precautions for returning to the ED were discussed. Encouraged follow up with PCP.    Portions of this note were generated with [employer]. Dictation errors may occur despite best attempts at proofreading.   Final Clinical Impressions(s) / ED Diagnoses   Final diagnoses:   Chest wall pain  Gastritis without bleeding, unspecified chronicity, unspecified gastritis type  Diarrhea, unspecified type    ED Discharge Orders         Ordered    dicyclomine (BENTYL) 20 MG tablet  2 times daily     04/27/18 1331    ondansetron (ZOFRAN ODT) 4 MG disintegrating tablet  Every 8 hours PRN     04/27/18 [address] PA-C 04/27/18 1335    Gareth Morgan, MD 04/28/18 [phone]

## 2018-04-27 NOTE — ED Notes (Signed)
Patient transported to X-ray 

## 2018-04-29 ENCOUNTER — Telehealth: Payer: Self-pay

## 2018-04-29 NOTE — Telephone Encounter (Signed)
NOTE: if patient returns phone call, please schedule Webex appt per PCP, if agreeable to patient.

## 2018-04-29 NOTE — Telephone Encounter (Signed)
Patient seen 04/27/18 at [facility] due to chest pain.   Per discharge summary:   Follow-Ups    1 Schedule an appointment with Tonia Ghent, MD (Family Medicine)  2 Follow up with [facility] (Emergency Medicine); If symptoms worsen   Attempted to reach patient on mobile, home, and temporary phones. All attempts were unsuccessful.   Attempted to reach patient's spouse on mobile phone. Attempt successful. Provided contact info.

## 2018-04-30 ENCOUNTER — Other Ambulatory Visit: Payer: Self-pay

## 2018-04-30 ENCOUNTER — Telehealth: Payer: Self-pay

## 2018-04-30 ENCOUNTER — Ambulatory Visit (INDEPENDENT_AMBULATORY_CARE_PROVIDER_SITE_OTHER): Payer: No Typology Code available for payment source | Admitting: Family Medicine

## 2018-04-30 DIAGNOSIS — R0789 Other chest pain: Secondary | ICD-10-CM

## 2018-04-30 MED ORDER — FAMOTIDINE 10 MG PO TABS: 10.0000 mg | ORAL_TABLET | Freq: Two times a day (BID) | ORAL | Status: DC | PRN

## 2018-04-30 NOTE — Telephone Encounter (Signed)
Left message on vm for pt to call back asap.  Need to get some info from her before Webex visit with Dr. Damita Dunnings at 3:15 today.

## 2018-04-30 NOTE — Progress Notes (Signed)
Virtual visit completed through WebEx.  Patient location: home  Provider location: [place] at [facility], office   =========================== History of Present Illness:   She is going to start therapy for her arm with ortho hx noted.    ER f/u.  She was getting up to go to BR.  She felt lightheaded prior to getting to BP.  Asked husband to go get her some gatorade.  In the meantime, she had midchest pain.  Didn't pass out but she was already laying down.  Called 911.  Transport to ER.  Lab w/u d/w pt.  She had improvement in her symptoms with Bentyl, Pepcid, antiemetics, that was reassuring that her pain is less likely to be cardiac.  She was discharged.    Weight decreased from 223 down to 218 after the ER visit.   No CP now but her chest wall is a little sore.  This is clearly different from ER sx.    She felt bloated the evening of the sx.  She has taken zofran prn since the ER eval, dosed x2.  She cut down on ibuprofen in the last week, but was taking several times a week prior to that.    She has had some indigestion.  She is taking lemon and vinegar to help with indigestion.    No BLE edema.  She still has a little indigestion.    Meds and allergies reviewed.   ROS: Per HPI unless specifically indicated in ROS section   Observations/Objective: NAD, speaking in complete sentences.    Assessment and Plan:  Differential diagnosis discussed with patient.  Emergency room course discussed with patient.  I question if she had indigestion related to NSAID use prior to the event.  Would avoid NSAIDs for now.  Reasonable to use Pepcid 10 mg twice a day for now and she can change to Prilosec 20 mg a day if needed.  Follow Up Instructions: call back as needed.  We talked about cardiology and/or GI f/u if not better.  She agrees with plan.  I discussed the assessment and treatment plan with the patient. The patient was provided an opportunity to ask questions and all were answered. The  patient agreed with the plan and demonstrated an understanding of the instructions.   The patient was advised to call back or seek an in-person evaluation if the symptoms worsen or if the condition fails to improve as anticipated.  Elsie Stain, MD

## 2018-04-30 NOTE — Telephone Encounter (Signed)
Dr. Damita Dunnings is in on visit now. Disregard previous message.

## 2018-05-02 DIAGNOSIS — R0789 Other chest pain: Secondary | ICD-10-CM | POA: Insufficient documentation

## 2018-05-02 NOTE — Assessment & Plan Note (Signed)
Differential diagnosis discussed with patient.  Emergency room course discussed with patient.  I question if she had indigestion related to NSAID use prior to the event.  Would avoid NSAIDs for now.  Reasonable to use Pepcid 10 mg twice a day for now and she can change to Prilosec 20 mg a day if needed.  Follow Up Instructions: call back as needed.  We talked about cardiology and/or GI f/u if not better.  She agrees with plan.

## 2018-07-03 ENCOUNTER — Other Ambulatory Visit: Payer: Self-pay | Admitting: Obstetrics and Gynecology

## 2018-07-16 ENCOUNTER — Other Ambulatory Visit: Payer: Self-pay | Admitting: Obstetrics and Gynecology

## 2018-07-18 ENCOUNTER — Other Ambulatory Visit: Payer: Self-pay | Admitting: Obstetrics and Gynecology

## 2018-07-24 ENCOUNTER — Other Ambulatory Visit: Payer: Self-pay | Admitting: Obstetrics and Gynecology

## 2018-07-24 MED ORDER — ESTRADIOL 0.1 MG/24HR TD PTWK: MEDICATED_PATCH | TRANSDERMAL | 1 refills | Status: DC

## 2018-07-24 NOTE — Progress Notes (Signed)
Climara refil x 90 d. Pt needs appt.in person.

## 2018-07-24 NOTE — Telephone Encounter (Signed)
Pt has not been seen since 2018, and needs f/u appt. Will refil Rx x 90 days to allow appt scheduling.

## 2018-07-24 NOTE — Telephone Encounter (Signed)
Please send recall

## 2018-12-24 ENCOUNTER — Encounter: Payer: Self-pay | Admitting: Family Medicine

## 2018-12-24 ENCOUNTER — Ambulatory Visit (INDEPENDENT_AMBULATORY_CARE_PROVIDER_SITE_OTHER): Payer: No Typology Code available for payment source | Admitting: Family Medicine

## 2018-12-24 ENCOUNTER — Other Ambulatory Visit: Payer: Self-pay

## 2018-12-24 VITALS — BP 110/82 | HR 80 | Temp 97.3°F | Ht 63.0 in | Wt 226.2 lb

## 2018-12-24 DIAGNOSIS — R252 Cramp and spasm: Secondary | ICD-10-CM | POA: Diagnosis not present

## 2018-12-24 DIAGNOSIS — Z0000 Encounter for general adult medical examination without abnormal findings: Secondary | ICD-10-CM | POA: Diagnosis not present

## 2018-12-24 DIAGNOSIS — E785 Hyperlipidemia, unspecified: Secondary | ICD-10-CM

## 2018-12-24 DIAGNOSIS — Z7189 Other specified counseling: Secondary | ICD-10-CM

## 2018-12-24 DIAGNOSIS — Z1211 Encounter for screening for malignant neoplasm of colon: Secondary | ICD-10-CM

## 2018-12-24 DIAGNOSIS — E559 Vitamin D deficiency, unspecified: Secondary | ICD-10-CM

## 2018-12-24 DIAGNOSIS — R5383 Other fatigue: Secondary | ICD-10-CM | POA: Diagnosis not present

## 2018-12-24 NOTE — Patient Instructions (Signed)
Go to the lab on the way out.  We'll contact you with your lab report. Let me see about options re: cramping/bloating. We'll get the cologuard ordered.  Take care.  Glad to see you.

## 2018-12-24 NOTE — Progress Notes (Addendum)
This visit occurred during the SARS-CoV-2 public health emergency.  Safety protocols were in place, including screening questions prior to the visit, additional usage of staff PPE, and extensive cleaning of exam room while observing appropriate contact time as indicated for disinfecting solutions.   CPE- See plan.  Routine anticipatory guidance given to patient.  See health maintenance.  The possibility exists that previously documented standard health maintenance information may have been brought forward from a previous encounter into this note.  If needed, that same information has been updated to reflect the current situation based on today's encounter.    Tetanus- 2013 Flu 2020 PNA and shingles d/w pt.   No Pap due to hysterectomy.  Mammogram encouraged. DXA not due yet.  HRT per gyn.  Living will d/w pt. Husband designated if patient were incapacitated.  D/w patient [id number] for colon cancer screening, including IFOB vs. Colonoscopy vs cologuard. Risks and benefits of both were discussed and patient voiced understanding.  she opts for cologuard.  HIV likely prev done with prenatal labs ~2003.  Diet and exercise d/w pt. 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   Fatigue noted, reasonable to recheck iron and hgb given her h/o anemia.    Fatigue noted, reasonable to recheck iron and hgb given her h/o anemia.  She has occ abd bloating.  No ankle edema.  Some occ foot edema.  It tends to come and go.  Some abd discomfort laying on her stomach, B groin pain worse when laying down.  No blood in stool.  No dysuria.  No vomiting.  No fevers.  No rash other than some eczema.  She has h/o periodic limb movement prev, but didn't tolerate requip.  Has "creepy crawlies" sensation on her legs.    She is off prilosec, has been off for years.  More GERD sx recently.  Would be okay to restart if needed.  She had wrist fracture surgery.  She is back to work, 7PM to [employer].    [place] and [place] reviewed  Meds, vitals, and allergies reviewed.   ROS: Per HPI.  Unless specifically indicated otherwise in HPI, the patient denies:  General: fever. Eyes: acute vision changes ENT: sore throat Cardiovascular: chest pain Respiratory: SOB GI: vomiting GU: dysuria Musculoskeletal: acute back pain Derm: acute rash Neuro: acute motor dysfunction Psych: worsening mood Endocrine: polydipsia Heme: bleeding Allergy: hayfever  GEN: nad, alert and oriented HEENT: ncat NECK: supple w/o LA CV: rrr. PULM: ctab, no inc wob ABD: soft, +bs EXT: no edema SKIN: no acute rash but healed scar noted on right forearm She has normal hip internal rotation range of motion. Bilateral greater trochanteric bursa not tender to palpation. Able to bear weight. She has some mild bilateral inguinal/lower and central abdominal tenderness to palpation without any rebound.

## 2018-12-25 DIAGNOSIS — Z7189 Other specified counseling: Secondary | ICD-10-CM | POA: Insufficient documentation

## 2018-12-25 LAB — LIPID PANEL
Cholesterol: 228 mg/dL — ABNORMAL HIGH (ref 0–200)
HDL: 46.1 mg/dL (ref >39.00)
LDL Cholesterol: 168 mg/dL — ABNORMAL HIGH (ref 0–99)
NonHDL: 182.36
Total CHOL/HDL Ratio: 5
Triglycerides: 71 mg/dL (ref 0.0–149.0)
VLDL: 14.2 mg/dL (ref 0.0–40.0)

## 2018-12-25 LAB — CBC WITH DIFFERENTIAL/PLATELET
Basophils Absolute: 0.1 10*3/uL (ref 0.0–0.1)
Basophils Relative: 1.5 % (ref 0.0–3.0)
Eosinophils Absolute: 0.1 10*3/uL (ref 0.0–0.7)
Eosinophils Relative: 1.3 % (ref 0.0–5.0)
HCT: 39.3 % (ref 36.0–46.0)
Hemoglobin: 12.8 g/dL (ref 12.0–15.0)
Lymphocytes Relative: 40.8 % (ref 12.0–46.0)
Lymphs Abs: 2.8 10*3/uL (ref 0.7–4.0)
MCHC: 32.6 g/dL (ref 30.0–36.0)
MCV: 91.9 fl (ref 78.0–100.0)
Monocytes Absolute: 0.4 10*3/uL (ref 0.1–1.0)
Monocytes Relative: 6.4 % (ref 3.0–12.0)
Neutro Abs: 3.4 10*3/uL (ref 1.4–7.7)
Neutrophils Relative %: 50 % (ref 43.0–77.0)
Platelets: 343 10*3/uL (ref 150.0–400.0)
RBC: 4.28 Mil/uL (ref 3.87–5.11)
RDW: 14.2 % (ref 11.5–15.5)
WBC: 6.8 10*3/uL (ref 4.0–10.5)

## 2018-12-25 LAB — COMPREHENSIVE METABOLIC PANEL
ALT: 16 U/L (ref 0–35)
AST: 17 U/L (ref 0–37)
Albumin: 3.7 g/dL (ref 3.5–5.2)
Alkaline Phosphatase: 72 U/L (ref 39–117)
BUN: 11 mg/dL (ref 6–23)
CO2: 28 mEq/L (ref 19–32)
Calcium: 8.8 mg/dL (ref 8.4–10.5)
Chloride: 105 mEq/L (ref 96–112)
Creatinine, Ser: 0.73 mg/dL (ref 0.40–1.20)
GFR: 100.45 mL/min (ref >60.00)
Glucose, Bld: 96 mg/dL (ref 70–99)
Potassium: 3.8 mEq/L (ref 3.5–5.1)
Sodium: 139 mEq/L (ref 135–145)
Total Bilirubin: 0.6 mg/dL (ref 0.2–1.2)
Total Protein: 7 g/dL (ref 6.0–8.3)

## 2018-12-25 LAB — VITAMIN D 25 HYDROXY (VIT D DEFICIENCY, FRACTURES): VITD: 29 ng/mL — ABNORMAL LOW (ref 30.00–100.00)

## 2018-12-25 LAB — IBC PANEL
Iron: 63 ug/dL (ref 42–145)
Saturation Ratios: 18.8 % — ABNORMAL LOW (ref 20.0–50.0)
Transferrin: 239 mg/dL (ref 212.0–360.0)

## 2018-12-25 LAB — TSH: TSH: 1.02 u[IU]/mL (ref 0.35–4.50)

## 2018-12-25 NOTE — Assessment & Plan Note (Signed)
Living will d/w pt.  Husband designated if patient were incapacitated.  

## 2018-12-25 NOTE — Assessment & Plan Note (Addendum)
See above.  I want to check her labs before we do anything else.  We talked about an as needed diuretic in case of edema but I want to see her labs first.  Discussed.  She agrees.

## 2018-12-25 NOTE — Assessment & Plan Note (Signed)
Tetanus- 2013 Flu 2020 PNA and shingles d/w pt.   No Pap due to hysterectomy.  Mammogram encouraged. DXA not due yet.  HRT per gyn.  Living will d/w pt. Husband designated if patient were incapacitated.  D/w patient [id number] for colon cancer screening, including IFOB vs. Colonoscopy vs cologuard. Risks and benefits of both were discussed and patient voiced understanding.  she opts for cologuard.  HIV likely prev done with prenatal labs ~2003.  Diet and exercise d/w pt. 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

## 2018-12-30 NOTE — Addendum Note (Signed)
Addended by: Josetta Huddle on: 12/30/2018 08:46 AM   Modules accepted: Orders

## 2018-12-31 ENCOUNTER — Encounter: Payer: Self-pay | Admitting: Family Medicine

## 2018-12-31 ENCOUNTER — Other Ambulatory Visit: Payer: Self-pay | Admitting: Family Medicine

## 2018-12-31 MED ORDER — OMEPRAZOLE 20 MG PO CPDR: 20.0000 mg | DELAYED_RELEASE_CAPSULE | Freq: Every day | ORAL | 0 refills | Status: DC

## 2019-01-09 ENCOUNTER — Other Ambulatory Visit: Payer: Self-pay | Admitting: Family Medicine

## 2019-01-09 MED ORDER — GABAPENTIN 100 MG PO CAPS: 100.0000 mg | ORAL_CAPSULE | Freq: Three times a day (TID) | ORAL | 3 refills | Status: DC | PRN

## 2019-01-18 LAB — COLOGUARD: Cologuard: NEGATIVE

## 2019-01-20 ENCOUNTER — Telehealth: Payer: Self-pay

## 2019-01-20 NOTE — Telephone Encounter (Signed)
Pt has episode of lt leg swelling one wk after thanksgiving. Today continues with swelling from lt thigh to below lt knee and pain goes all the way down leg to foot;pt presently also having numbness in lt leg and heaviness in lt leg which is a new symptom. Lt foot is painful but not swollen or red. There is no redness or knots in lt leg. Lt groin also hurts on and off. Pt has some problem walking. No CP or SOB but has head congestion for 1 wk. Pt was advised for her best care Dr Damita Dunnings does not recommend that pt wait until 01/21/19 to be seen and I advised pt she would be going against medical advice if she does not go to Ed today for eval and possible imaging but pt said she does not want to go to ED and understands what she has been advised but wants appt for 01/21/19 with Dr Damita Dunnings anyway. UC & ED precautions given and pt voiced understanding. Pt scheduled in office appt 01/19/19 at 11 AM with Dr Damita Dunnings. FYI to Dr Damita Dunnings.

## 2019-01-21 ENCOUNTER — Encounter: Payer: Self-pay | Admitting: Family Medicine

## 2019-01-21 ENCOUNTER — Ambulatory Visit (INDEPENDENT_AMBULATORY_CARE_PROVIDER_SITE_OTHER): Payer: No Typology Code available for payment source | Admitting: Family Medicine

## 2019-01-21 ENCOUNTER — Other Ambulatory Visit: Payer: Self-pay

## 2019-01-21 VITALS — BP 102/62 | HR 78 | Temp 96.9°F | Ht 63.0 in | Wt 227.5 lb

## 2019-01-21 DIAGNOSIS — R7989 Other specified abnormal findings of blood chemistry: Secondary | ICD-10-CM

## 2019-01-21 DIAGNOSIS — M7989 Other specified soft tissue disorders: Secondary | ICD-10-CM | POA: Diagnosis not present

## 2019-01-21 DIAGNOSIS — R140 Abdominal distension (gaseous): Secondary | ICD-10-CM | POA: Diagnosis not present

## 2019-01-21 LAB — D-DIMER, QUANTITATIVE: D-Dimer, Quant: 0.84 mcg/mL FEU — ABNORMAL HIGH (ref <0.50)

## 2019-01-21 MED ORDER — PREDNISONE 10 MG PO TABS: ORAL_TABLET | ORAL | 0 refills | Status: DC

## 2019-01-21 NOTE — Progress Notes (Signed)
This visit occurred during the SARS-CoV-2 public health emergency.  Safety protocols were in place, including screening questions prior to the visit, additional usage of staff PPE, and extensive cleaning of exam room while observing appropriate contact time as indicated for disinfecting solutions.  Leg sx.  Out of work last night.  She feels better today.  She had more swelling yesterday, at the L thigh laterally, the L knee (popliteal area), the L foot was hurting.  She has some tingling in the L foot this AM, swelling is resolved.    She has less burping on prilosec.  No abd pain.  Still with some  abd bloating.    No CP. No h/o DVT.   She has noted lower BP recently.  Discussed maintaining adequate hydration in the meantime.  She had dental work done the week prior to thanksgiving. No fevers.  Unclear if this was incidental or related.  She took gabapentin w/o ADE, only too 100mg  at night for about 2 nights.  We talked about options, with using the lower dose (100 mg) as needed at night.  Meds, vitals, and allergies reviewed.   ROS: Per HPI unless specifically indicated in ROS section   GEN: nad, alert and oriented HEENT: ncat NECK: supple w/o LA CV: rrr.  PULM: ctab, no inc wob ABD: soft, +bs, not tender to palpation. EXT: no edema SKIN: no acute rash Straight leg raise negative bilaterally. Strength and sensation within normal limits bilateral lower extremities. Bilateral calf circumference 39.5 cm, equal. Calves not tender.

## 2019-01-21 NOTE — Patient Instructions (Signed)
Go to the lab on the way out.  We'll contact you with your lab report. If D dimer if neg, then start prednisone and update me.   If positive, then we'll work on setting up an ultrasound of your leg.  We'll go from there.  Take care.  Glad to see you.

## 2019-01-21 NOTE — Telephone Encounter (Signed)
Noted.  Will see at OV.  

## 2019-01-22 ENCOUNTER — Ambulatory Visit
Admission: RE | Admit: 2019-01-22 | Discharge: 2019-01-22 | Disposition: A | Discharge status: Home / Self Care | Payer: No Typology Code available for payment source | Source: Ambulatory Visit | Attending: Family Medicine | Admitting: Family Medicine

## 2019-01-22 DIAGNOSIS — R7989 Other specified abnormal findings of blood chemistry: Secondary | ICD-10-CM | POA: Insufficient documentation

## 2019-01-22 DIAGNOSIS — M7989 Other specified soft tissue disorders: Secondary | ICD-10-CM | POA: Insufficient documentation

## 2019-01-22 DIAGNOSIS — R140 Abdominal distension (gaseous): Secondary | ICD-10-CM | POA: Insufficient documentation

## 2019-01-22 NOTE — Assessment & Plan Note (Signed)
Her burping is better with trial of PPI.  We will see how she is feeling after trial of prednisone.  If she has persistent abdominal bloating we can work this up later on.  It may be reasonable to start with abdominal ultrasound if she has persistent symptoms.

## 2019-01-22 NOTE — Assessment & Plan Note (Addendum)
History of.  Check D-dimer.  This was positive so ultrasound ordered.  Negative.  Patient contacted.  The plan was to proceed with prednisone for possible radicular symptoms even though her straight leg raise was negative, since she was having some pain down the left leg.  Steroid cautions previously discussed with patient.  She will update me next week. >25 minutes spent in face to face time with patient, >50% spent in counselling or coordination of care

## 2019-02-01 ENCOUNTER — Other Ambulatory Visit: Payer: Self-pay | Admitting: Family Medicine

## 2019-02-04 ENCOUNTER — Encounter: Payer: Self-pay | Admitting: Family Medicine

## 2019-02-14 IMAGING — CR DG CHEST 1V PORT
1 series · 1 of 1 positions shown · non-contrast
Comparison: 07/03/2011

CLINICAL DATA: Chest pain with nausea

EXAM:
PORTABLE CHEST 1 VIEW

[portable]
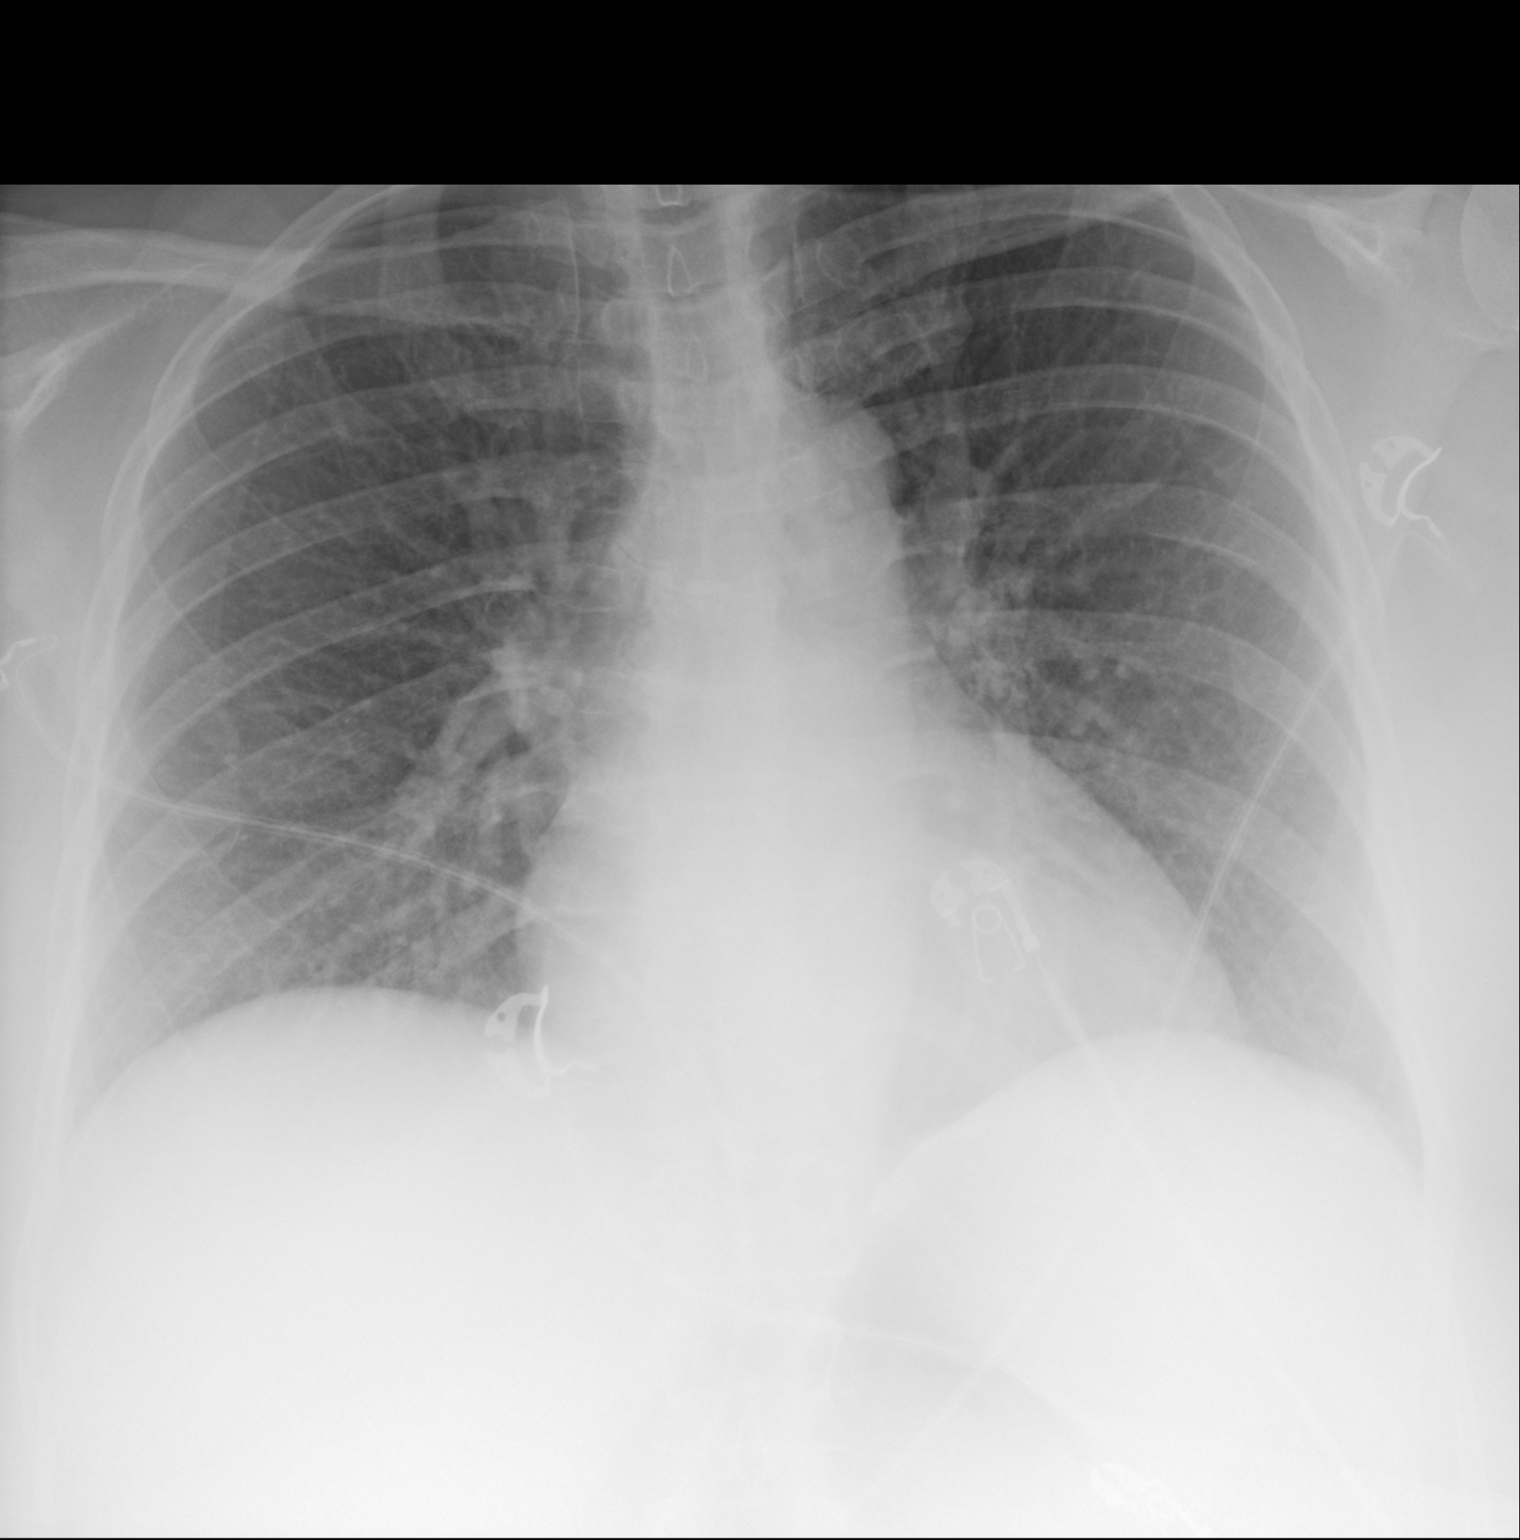

[1 of 1 positions shown; findings below may reference images not displayed]

FINDINGS: Borderline cardiomegaly with mild central congestion. No focal
airspace disease or pleural effusion. No pneumothorax. Surgical
changes in the cervical spine.
IMPRESSION: Borderline cardiomegaly with mild central congestion.

## 2019-02-27 ENCOUNTER — Other Ambulatory Visit: Payer: Self-pay | Admitting: Obstetrics and Gynecology

## 2019-02-27 NOTE — Progress Notes (Signed)
[  pt needs a visit to assess HT Televisit okay.

## 2019-04-20 ENCOUNTER — Other Ambulatory Visit: Payer: Self-pay | Admitting: Obstetrics and Gynecology

## 2019-06-04 ENCOUNTER — Telehealth: Payer: Self-pay | Admitting: Family Medicine

## 2019-06-04 NOTE — Telephone Encounter (Signed)
Called to schedule appt per mychart request. lvm asking pt to call office.

## 2019-06-05 ENCOUNTER — Ambulatory Visit: Payer: No Typology Code available for payment source | Admitting: Family Medicine

## 2019-06-05 DIAGNOSIS — Z0289 Encounter for other administrative examinations: Secondary | ICD-10-CM

## 2019-06-12 ENCOUNTER — Telehealth: Payer: Self-pay

## 2019-06-12 ENCOUNTER — Ambulatory Visit (INDEPENDENT_AMBULATORY_CARE_PROVIDER_SITE_OTHER): Payer: No Typology Code available for payment source | Admitting: Family Medicine

## 2019-06-12 ENCOUNTER — Ambulatory Visit: Payer: No Typology Code available for payment source | Admitting: Internal Medicine

## 2019-06-12 ENCOUNTER — Encounter: Payer: Self-pay | Admitting: Family Medicine

## 2019-06-12 ENCOUNTER — Other Ambulatory Visit: Payer: Self-pay

## 2019-06-12 ENCOUNTER — Ambulatory Visit (INDEPENDENT_AMBULATORY_CARE_PROVIDER_SITE_OTHER)
Admission: RE | Admit: 2019-06-12 | Discharge: 2019-06-12 | Disposition: A | Discharge status: Home / Self Care | Payer: No Typology Code available for payment source | Source: Ambulatory Visit | Attending: Family Medicine | Admitting: Family Medicine

## 2019-06-12 VITALS — BP 128/76 | HR 76 | Temp 96.9°F | Ht 63.0 in | Wt 222.5 lb

## 2019-06-12 DIAGNOSIS — R109 Unspecified abdominal pain: Secondary | ICD-10-CM | POA: Diagnosis not present

## 2019-06-12 DIAGNOSIS — R3129 Other microscopic hematuria: Secondary | ICD-10-CM

## 2019-06-12 LAB — CBC WITH DIFFERENTIAL/PLATELET
Basophils Absolute: 0.1 10*3/uL (ref 0.0–0.1)
Basophils Relative: 0.9 % (ref 0.0–3.0)
Eosinophils Absolute: 0.1 10*3/uL (ref 0.0–0.7)
Eosinophils Relative: 2 % (ref 0.0–5.0)
HCT: 40.4 % (ref 36.0–46.0)
Hemoglobin: 13.3 g/dL (ref 12.0–15.0)
Lymphocytes Relative: 40.3 % (ref 12.0–46.0)
Lymphs Abs: 2.5 10*3/uL (ref 0.7–4.0)
MCHC: 32.9 g/dL (ref 30.0–36.0)
MCV: 91.7 fl (ref 78.0–100.0)
Monocytes Absolute: 0.4 10*3/uL (ref 0.1–1.0)
Monocytes Relative: 6.8 % (ref 3.0–12.0)
Neutro Abs: 3 10*3/uL (ref 1.4–7.7)
Neutrophils Relative %: 50 % (ref 43.0–77.0)
Platelets: 383 10*3/uL (ref 150.0–400.0)
RBC: 4.4 Mil/uL (ref 3.87–5.11)
RDW: 14 % (ref 11.5–15.5)
WBC: 6.1 10*3/uL (ref 4.0–10.5)

## 2019-06-12 LAB — POC URINALSYSI DIPSTICK (AUTOMATED)
Bilirubin, UA: NEGATIVE
Blood, UA: 50
Glucose, UA: NEGATIVE
Ketones, UA: NEGATIVE
Leukocytes, UA: NEGATIVE
Nitrite, UA: NEGATIVE
Protein, UA: POSITIVE — AB
Spec Grav, UA: >=1.03 — AB (ref 1.010–1.025)
Urobilinogen, UA: 0.2 E.U./dL
pH, UA: 6 (ref 5.0–8.0)

## 2019-06-12 LAB — HEPATIC FUNCTION PANEL
ALT: 14 U/L (ref 0–35)
AST: 17 U/L (ref 0–37)
Albumin: 4.2 g/dL (ref 3.5–5.2)
Alkaline Phosphatase: 75 U/L (ref 39–117)
Bilirubin, Direct: 0.1 mg/dL (ref 0.0–0.3)
Total Bilirubin: 0.5 mg/dL (ref 0.2–1.2)
Total Protein: 7.4 g/dL (ref 6.0–8.3)

## 2019-06-12 LAB — BASIC METABOLIC PANEL
BUN: 14 mg/dL (ref 6–23)
CO2: 28 mEq/L (ref 19–32)
Calcium: 9 mg/dL (ref 8.4–10.5)
Chloride: 104 mEq/L (ref 96–112)
Creatinine, Ser: 0.8 mg/dL (ref 0.40–1.20)
GFR: 90.22 mL/min (ref >60.00)
Glucose, Bld: 99 mg/dL (ref 70–99)
Potassium: 3.5 mEq/L (ref 3.5–5.1)
Sodium: 138 mEq/L (ref 135–145)

## 2019-06-12 NOTE — Patient Instructions (Addendum)
Fluids-drink when you get home  Urine culture is pending to look for infection   Abdominal xray now  Labs now   Will make a plan when those tests return   If you suddenly worsen/change please call us

## 2019-06-12 NOTE — Assessment & Plan Note (Signed)
R flank radiating to R upper abdomen in pt with h/o gallstones  Also some blood on ua  Disc differential incl msk pain, gallbladder cause, renal stone, uti  Reassuring exam  Lab today -cbc/bmet and hepatic KUB today Urine cx pending Will use heat prn/analgesics and monitor symptoms

## 2019-06-12 NOTE — Progress Notes (Deleted)
Subjective:    Patient ID: [name], female    DOB: 01-04-1966, 54 y.o.   MRN: [id number]  HPI  Patient presents to the clinic today with complaint of side pain.  This started.  She describes the pain as.  Review of Systems      Past Medical History:  Diagnosis Date  . Anemia   . Anxiety state, unspecified   . GERD (gastroesophageal reflux disease)   . Headache(784.0)   . Other constipation   . SVT (supraventricular tachycardia) (HCC)     Current Outpatient Medications  Medication Sig Dispense Refill  . Cholecalciferol (VITAMIN D3) 50 MCG (2000 UT) TABS Take 2,000 Units by mouth.    . estradiol (CLIMARA - DOSED IN MG/24 HR) 0.1 mg/24hr patch PLACE 1 PATCH (0.1 MG TOTAL) ONTO THE SKIN ONCE A WEEK. -- MUST FILL AT [facility] 4 patch 5  . gabapentin (NEURONTIN) 100 MG capsule Take 1-2 capsules (100-200 mg total) by mouth 3 (three) times daily as needed. 90 capsule 3  . Multiple Vitamins-Minerals (MULTI-VITAMIN GUMMIES) CHEW Chew 2 tablets by mouth daily.     Marland Kitchen omeprazole (PRILOSEC) 20 MG capsule TAKE 1 CAPSULE BY MOUTH EVERY DAY 30 capsule 5  . predniSONE (DELTASONE) 10 MG tablet Take 2 a day for 5 days, then 1 a day for 5 days, with food. Don't take with aleve/ibuprofen. 15 tablet 0   No current facility-administered medications for this visit.    Allergies  Allergen Reactions  . Requip [Ropinirole Hcl]     Intolerant  . Sulfonamide Derivatives Rash    Family History  Problem Relation Age of Onset  . Deep vein thrombosis Mother        cerebral blood clot   . Heart attack Mother   . Heart failure Father   . Diabetes Maternal Grandmother   . Stroke Maternal Grandmother   . Cancer Maternal Grandfather        Prostate   . Depression Maternal Uncle   . Hypertension Neg Hx   . Colon cancer Neg Hx   . Breast cancer Neg Hx     Social History   Socioeconomic History  . Marital status: Married    Spouse name: [name]  . Number of children: 4  . Years of  education: 11  . Highest education level: Not on file  Occupational History  . Occupation: [employer]     Employer: [place]: [employer]     Employer: [employer]  Tobacco Use  . Smoking status: Never Smoker  . Smokeless tobacco: Never Used  Substance and Sexual Activity  . Alcohol use: No    Alcohol/week: 0.0 standard drinks  . Drug use: No  . Sexual activity: Not Currently    Birth control/protection: Surgical  Other Topics Concern  . Not on file  Social History Narrative   Patient lives with her husband [name] ) and her children.   Patient has four children.   Patient works full-time.   Patient has a college education.   Patient is right-handed.   Patient drinks caffeine- once or twice a week.     Social Determinants of Health   Financial Resource Strain:   . Difficulty of Paying Living Expenses:   Food Insecurity:   . Worried About Charity fundraiser in the Last Year:   . [place] in the Last Year:   Transportation Needs:   . Lack of  Transportation (Medical):   Marland Kitchen Lack of Transportation (Non-Medical):   Physical Activity:   . Days of Exercise per Week:   . Minutes of Exercise per Session:   Stress:   . Feeling of Stress :   Social Connections:   . Frequency of Communication with Friends and Family:   . Frequency of Social Gatherings with Friends and Family:   . Attends Religious Services:   . Active Member of Clubs or Organizations:   . Attends Archivist Meetings:   Marland Kitchen Marital Status:   Intimate Partner Violence:   . Fear of Current or Ex-Partner:   . Emotionally Abused:   Marland Kitchen Physically Abused:   . Sexually Abused:      Constitutional: Denies fever, malaise, fatigue, headache or abrupt weight changes.  HEENT: Denies eye pain, eye redness, ear pain, ringing in the ears, wax buildup, runny nose, nasal congestion, bloody nose, or sore throat. Respiratory: Denies difficulty breathing, shortness of breath,  cough or sputum production.   Cardiovascular: Denies chest pain, chest tightness, palpitations or swelling in the hands or feet.  Gastrointestinal: Denies abdominal pain, bloating, constipation, diarrhea or blood in the stool.  GU: Denies urgency, frequency, pain with urination, burning sensation, blood in urine, odor or discharge. Musculoskeletal: Denies decrease in range of motion, difficulty with gait, muscle pain or joint pain and swelling.  Skin: Denies redness, rashes, lesions or ulcercations.  Neurological: Denies dizziness, difficulty with memory, difficulty with speech or problems with balance and coordination.  Psych: Denies anxiety, depression, SI/HI.  No other specific complaints in a complete review of systems (except as listed in HPI above).  Objective:   Physical Exam  LMP 01/31/2015  Wt Readings from Last 3 Encounters:  01/21/19 227 lb 8 oz (103.2 kg)  12/24/18 226 lb 4 oz (102.6 kg)  04/27/18 219 lb (99.3 kg)    General: Appears their stated age, well developed, well nourished in NAD. Skin: Warm, dry and intact. No rashes, lesions or ulcerations noted. HEENT: Head: normal shape and size; Eyes: sclera white, no icterus, conjunctiva pink, PERRLA and EOMs intact; Ears: Tm's gray and intact, normal light reflex; Nose: mucosa pink and moist, septum midline; Throat/Mouth: Teeth present, mucosa pink and moist, no exudate, lesions or ulcerations noted.  Neck:  Neck supple, trachea midline. No masses, lumps or thyromegaly present.  Cardiovascular: Normal rate and rhythm. S1,S2 noted.  No murmur, rubs or gallops noted. No JVD or BLE edema. No carotid bruits noted. Pulmonary/Chest: Normal effort and positive vesicular breath sounds. No respiratory distress. No wheezes, rales or ronchi noted.  Abdomen: Soft and nontender. Normal bowel sounds. No distention or masses noted. Liver, spleen and kidneys non palpable. Musculoskeletal: Normal range of motion. No signs of joint swelling. No  difficulty with gait.  Neurological: Alert and oriented. Cranial nerves II-XII grossly intact. Coordination normal.  Psychiatric: Mood and affect normal. Behavior is normal. Judgment and thought content normal.   EKG:  BMET    Component Value Date/Time   NA 139 12/24/2018 1712   K 3.8 12/24/2018 1712   CL 105 12/24/2018 1712   CO2 28 12/24/2018 1712   GLUCOSE 96 12/24/2018 1712   BUN 11 12/24/2018 1712   CREATININE 0.73 12/24/2018 1712   CREATININE 0.79 11/27/2013 0919   CALCIUM 8.8 12/24/2018 1712   GFRNONAA >60 04/27/2018 0936   GFRAA >60 04/27/2018 0936    Lipid Panel     Component Value Date/Time   CHOL 228 (H) 12/24/2018 1712  TRIG 71.0 12/24/2018 1712   HDL 46.10 12/24/2018 1712   CHOLHDL 5 12/24/2018 1712   VLDL 14.2 12/24/2018 1712   LDLCALC 168 (H) 12/24/2018 1712    CBC    Component Value Date/Time   WBC 6.8 12/24/2018 1712   RBC 4.28 12/24/2018 1712   HGB 12.8 12/24/2018 1712   HCT 39.3 12/24/2018 1712   PLT 343.0 12/24/2018 1712   MCV 91.9 12/24/2018 1712   MCH 29.3 04/27/2018 0751   MCHC 32.6 12/24/2018 1712   RDW 14.2 12/24/2018 1712   LYMPHSABS 2.8 12/24/2018 1712   MONOABS 0.4 12/24/2018 1712   EOSABS 0.1 12/24/2018 1712   BASOSABS 0.1 12/24/2018 1712    Hgb A1C Lab Results  Component Value Date   HGBA1C 5.5 05/14/2014            Assessment & Plan:   Webb Silversmith, NP This visit occurred during the SARS-CoV-2 public health emergency.  Safety protocols were in place, including screening questions prior to the visit, additional usage of staff PPE, and extensive cleaning of exam room while observing appropriate contact time as indicated for disinfecting solutions.

## 2019-06-12 NOTE — Assessment & Plan Note (Signed)
Along with R flank pain -see A/P  Disc poss of gallstone etiology  inst to avoid fatty foods

## 2019-06-12 NOTE — Telephone Encounter (Signed)
[facility] - Client TELEPHONE ADVICE RECORD AccessNurse Patient Name: [name] Gender: Female DOB: Nov 05, 1965 Age: 54 Y 28 M 29 D Return Phone Number: [phone] (Primary) [place] [address] Client [facility] - Client Client Site [facility] Physician Renford Dills - MD Contact Type Call Who Is Calling Patient / Member / Family / Caregiver Call Type Triage / Clinical Relationship To Patient Self Return Phone Number [phone] (Primary) Chief Complaint Flank Pain Reason for Call Symptomatic / Request for [place] states is w/[facility] - pt is having dull side pain. Translation No Nurse Assessment Nurse: Altamease Oiler, RN, Anastasia Fiedler Date/Time (Eastern Time): 06/11/2019 4:51:17 PM Confirm and document reason for call. If symptomatic, describe symptoms. ---Caller states she is having right side back pain. Pain is 5/10 and pain is constant and pain started Monday. No blood in urine. Caller states frequency when urinating. No fever. Has the patient had close contact with a person known or suspected to have the novel coronavirus illness OR traveled / lives in area with major community spread (including international travel) in the last 14 days from the onset of symptoms? * If Asymptomatic, screen for exposure and travel within the last 14 days. ---No Does the patient have any new or worsening symptoms? ---Yes Will a triage be completed? ---Yes Related visit to physician within the last 2 weeks? ---No Does the PT have any chronic conditions? (i.e. diabetes, asthma, this includes High risk factors for pregnancy, etc.) ---No Is the patient pregnant or possibly pregnant? (Ask all females between the ages of 52-55) ---No Is this a behavioral health or substance abuse call? ---No Guidelines Guideline Title Affirmed Question Affirmed  Notes Nurse Date/Time (Eastern Time) Flank Pain MODERATE pain (e.g., interferes with normal activities or awakens from sleep) Altamease Oiler, RN, Anastasia Fiedler 06/11/2019 4:52:52 PM Disp. Time Eilene Ghazi Time) Disposition Final User PLEASE NOTE: All timestamps contained within this report are represented as [place] Standard Time. CONFIDENTIALTY NOTICE: This fax transmission is intended only for the addressee. It contains information that is legally privileged, confidential or otherwise protected from use or disclosure. If you are not the intended recipient, you are strictly prohibited from reviewing, disclosing, copying using or disseminating any of this information or taking any action in reliance on or regarding this information. If you have received this fax in error, please notify us immediately by telephone so that we can arrange for its return to [place]. Phone: [phone], Toll-Free: [phone], Fax: [phone] Page: 2 of 2 Call Id: [id number] 06/11/2019 4:54:41 PM See PCP within 24 Hours Yes Altamease Oiler, RN, Orlene Och Disagree/Comply Comply Caller Understands Yes PreDisposition Did not know what to do Care Advice Given Per Guideline SEE PCP WITHIN 24 HOURS: * IF OFFICE WILL BE CLOSED AND NO PCP (PRIMARY CARE PROVIDER) SECONDLEVEL TRIAGE: You need to be seen within the next 24 hours. A clinic or an urgent care center is often a good source of care if your doctor's office is closed or you can't get an appointment. CALL BACK IF: * Fever over 100.4 F (38.0 C) * You become worse. CARE ADVICE given per Flank Pain (Adult) guideline. Referrals REFERRED TO PCP OFFICE

## 2019-06-12 NOTE — Telephone Encounter (Signed)
Pt no showed for appt..... 

## 2019-06-12 NOTE — Progress Notes (Signed)
Subjective:    Patient ID: [name], female    DOB: 1965/08/29, 54 y.o.   MRN: [id number]  This visit occurred during the SARS-CoV-2 public health emergency.  Safety protocols were in place, including screening questions prior to the visit, additional usage of staff PPE, and extensive cleaning of exam room while observing appropriate contact time as indicated for disinfecting solutions.    HPI 54 yo pt of Dr Damita Dunnings presents with R flank pain   Wt Readings from Last 3 Encounters:  06/12/19 222 lb 8 oz (100.9 kg)  01/21/19 227 lb 8 oz (103.2 kg)  12/24/18 226 lb 4 oz (102.6 kg)   39.41 kg/m   Symptoms started on Monday-woke up with it  Pain in R side - ? Muscular  Hurt to twist/turn or lie on that side   Monday 8/10  Pain has steadily improved   Now 3/10  Was able to sleep on side propped up  No rash, no itch   Pain is sharp to move but constantly dull   Not much appetite  No nausea   Drank a lot of water (was fasting for a few days)  Some frequency yesterday  No blood in urine   Had a tick bite last week- behind her L knee  That got better   H/o gallbladder problems in the past LaUS Abdomen Complete (Accession [id number]) (Order [id number]) Imaging Date: 11/27/2013 Department: [place] Released By: Pryor Ochoa H Authorizing: Tawnya Crook, CNM  Exam Status  Status  Final [99]  PACS Intelerad Image Link  Show images for US Abdomen Complete  Study Result  CLINICAL DATA:  Right upper quadrant pain radiating to the back.  EXAM: ULTRASOUND ABDOMEN COMPLETE  COMPARISON:  October 12, 2012  FINDINGS: Gallbladder: There are gallstones. No wall thickening visualized. No sonographic Murphy sign noted.  Common bile duct: Diameter: 4.4 mm  Liver: No focal lesion identified. There is mild diffuse increased echotexture of the liver.  IVC: No abnormality visualized.  Pancreas: Visualized portion  unremarkable.  Spleen: Size and appearance within normal limits.  Right Kidney: Length: 11.6 cm. Echogenicity within normal limits. No mass or hydronephrosis visualized.  Left Kidney: Length: 10.2 cm. Echogenicity within normal limits. No mass or hydronephrosis visualized.  Abdominal aorta: No aneurysm visualized.  Other findings: None.  IMPRESSION: Cholelithiasis without sonographic evidence of acute cholecystitis. Mild diffuse increased echotexture of the liver, nonspecific but can be seen in fatty infiltration of liver.   Electronically Signed   By: Abelardo Diesel M.D.   On: 11/27/2013 09:28    st [place]      UA:  Protein and some blood  Results for orders placed or performed in visit on 06/12/19  POCT Urinalysis Dipstick (Automated)  Result Value Ref Range   Color, UA Yellow    Clarity, UA Hazy    Glucose, UA Negative Negative   Bilirubin, UA Negative    Ketones, UA Negative    Spec Grav, UA >=1.030 (A) 1.010 - 1.025   Blood, UA 50 Ery/uL    pH, UA 6.0 5.0 - 8.0   Protein, UA Positive (A) Negative   Urobilinogen, UA 0.2 0.2 or 1.0 E.U./dL   Nitrite, UA Negaitve    Leukocytes, UA Negative Negative    Has slowed down drinking fluids  - made her bloated and pain was worse   Has a h/o low back pain-not bothering her now   Patient Active Problem List   Diagnosis Date  Noted  . Right flank pain 06/12/2019  . Right lateral abdominal pain 06/12/2019  . Hematuria, microscopic 06/12/2019  . Leg swelling 01/22/2019  . Abdominal bloating 01/22/2019  . Advance care planning 12/25/2018  . Atypical chest pain 05/02/2018  . Fatigue 11/08/2017  . Postural dizziness with presyncope 05/02/2017  . Muscle cramping 05/02/2017  . Cough 03/04/2017  . Trochanteric bursitis 01/01/2017  . Routine general medical examination at a health care facility 10/04/2015  . Pelvic pain in female 11/19/2013  . Paresthesia 12/05/2012  . Low back pain 09/20/2010  . GERD  (gastroesophageal reflux disease) 07/28/2010  . ADJUSTMENT REACTION WITH PHYSICAL SYMPTOMS 08/10/2009  . SKIN LESION, BENIGN 11/13/2008  . ECZEMA 01/21/2008  . CHRONIC RHINITIS 10/01/2007  . HEADACHE, CHRONIC 10/01/2007  . HYPERCHOLESTEROLEMIA 03/27/2007  . 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 CONSTIPATION, CHRONIC 03/27/2007  . ANXIETY, MILD 07/16/2006   Past Medical History:  Diagnosis Date  . Anemia   . Anxiety state, unspecified   . GERD (gastroesophageal reflux disease)   . Headache(784.0)   . Other constipation   . SVT (supraventricular tachycardia) (HCC)    Past Surgical History:  Procedure Laterality Date  . CARDIAC CATHETERIZATION  12/09/03   Normal  . CERVICAL DISC SURGERY     Ruptured disc repair / and approach c5/6, c6/7; plate  . HERNIA REPAIR     umbilical  . LAPAROSCOPIC ASSISTED VAGINAL HYSTERECTOMY N/A 03/09/2015   Procedure: LAPAROSCOPIC ASSISTED VAGINAL HYSTERECTOMY;  Surgeon: Jonnie Kind, MD;  Location: [facility];  Service: Gynecology;  Laterality: N/A;  . OPEN REDUCTION INTERNAL FIXATION (ORIF) DISTAL RADIAL FRACTURE Right 03/01/2018   Procedure: OPEN REDUCTION INTERNAL FIXATION (ORIF) DISTAL RADIAL FRACTURE;  Surgeon: Iran Planas, MD;  Location: [facility];  Service: Orthopedics;  Laterality: Right;  . RECTOCELE REPAIR N/A 03/09/2015   Procedure: POSTERIOR REPAIR (RECTOCELE);  Surgeon: Jonnie Kind, MD;  Location: [facility];  Service: Gynecology;  Laterality: N/A;  . SALPINGOOPHORECTOMY Bilateral 03/09/2015   Procedure: SALPINGO OOPHORECTOMY;  Surgeon: Jonnie Kind, MD;  Location: [facility];  Service: Gynecology;  Laterality: Bilateral;  . TUBAL LIGATION    . VAGINAL DELIVERY  90, 97, 99, 03   x 4    Social History   Tobacco Use  . Smoking status: Never Smoker  . Smokeless tobacco: Never Used  Substance Use Topics  . Alcohol use: No    Alcohol/week: 0.0 standard drinks  . Drug use: No   Family History  Problem Relation Age of Onset  . Deep vein  thrombosis Mother        cerebral blood clot   . Heart attack Mother   . Heart failure Father   . Diabetes Maternal Grandmother   . Stroke Maternal Grandmother   . Cancer Maternal Grandfather        Prostate   . Depression Maternal Uncle   . Hypertension Neg Hx   . Colon cancer Neg Hx   . Breast cancer Neg Hx    Allergies  Allergen Reactions  . Requip [Ropinirole Hcl]     Intolerant  . Sulfonamide Derivatives Rash   Current Outpatient Medications on File Prior to Visit  Medication Sig Dispense Refill  . Cholecalciferol (VITAMIN D3) 50 MCG (2000 UT) TABS Take 2,000 Units by mouth.    . gabapentin (NEURONTIN) 100 MG capsule Take 1-2 capsules (100-200 mg total) by mouth 3 (three) times daily as needed. 90 capsule 3  . Multiple Vitamins-Minerals (MULTI-VITAMIN GUMMIES) CHEW Chew 2 tablets by  mouth daily.     Marland Kitchen omeprazole (PRILOSEC) 20 MG capsule TAKE 1 CAPSULE BY MOUTH EVERY DAY 30 capsule 5  . estradiol (CLIMARA - DOSED IN MG/24 HR) 0.1 mg/24hr patch PLACE 1 PATCH (0.1 MG TOTAL) ONTO THE SKIN ONCE A WEEK. -- MUST FILL AT [facility] (Patient not taking: PLACE 1 PATCH (0.1 MG TOTAL) ONTO THE SKIN ONCE A WEEK. -- MUST FILL AT [facility]) 4 patch 5  . [DISCONTINUED] estradiol (CLIMARA - DOSED IN MG/24 HR) 0.1 mg/24hr patch PLACE 1 PATCH (0.1 MG TOTAL) ONTO THE SKIN ONCE A WEEK. -- MUST FILL AT [facility] 12 patch 1   No current facility-administered medications on file prior to visit.    Review of Systems  Constitutional: Negative for activity change, appetite change, fatigue, fever and unexpected weight change.  HENT: Negative for congestion, ear pain, rhinorrhea, sinus pressure and sore throat.   Eyes: Negative for pain, redness and visual disturbance.  Respiratory: Negative for cough, shortness of breath and wheezing.   Cardiovascular: Negative for chest pain and palpitations.  Gastrointestinal: Positive for abdominal pain. Negative for anal bleeding, blood in stool, constipation, diarrhea, nausea,  rectal pain and vomiting.  Endocrine: Negative for polydipsia and polyuria.  Genitourinary: Positive for flank pain, frequency and hematuria. Negative for decreased urine volume, dysuria, pelvic pain and urgency.  Musculoskeletal: Negative for arthralgias, back pain and myalgias.  Skin: Negative for pallor and rash.  Allergic/Immunologic: Negative for environmental allergies.  Neurological: Negative for dizziness, syncope and headaches.  Hematological: Negative for adenopathy. Does not bruise/bleed easily.  Psychiatric/Behavioral: Negative for decreased concentration and dysphoric mood. The patient is not nervous/anxious.        Objective:   Physical Exam Constitutional:      General: She is not in acute distress.    Appearance: Normal appearance. She is well-developed. She is obese. She is not ill-appearing.  HENT:     Head: Normocephalic and atraumatic.     Mouth/Throat:     Mouth: Mucous membranes are moist.     Pharynx: Oropharynx is clear.  Eyes:     General: No scleral icterus.    Conjunctiva/sclera: Conjunctivae normal.     Pupils: Pupils are equal, round, and reactive to light.  Cardiovascular:     Rate and Rhythm: Normal rate and regular rhythm.     Heart sounds: Normal heart sounds.  Pulmonary:     Effort: Pulmonary effort is normal. No respiratory distress.     Breath sounds: Normal breath sounds. No wheezing or rales.  Abdominal:     General: Abdomen is protuberant. Bowel sounds are normal. There is no distension or abdominal bruit.     Palpations: Abdomen is soft. There is no hepatomegaly, splenomegaly or mass.     Tenderness: There is abdominal tenderness in the right upper quadrant. There is right CVA tenderness. There is no left CVA tenderness, guarding or rebound. Negative signs include Murphy's sign and McBurney's sign.     Hernia: No hernia is present.  Musculoskeletal:        General: Tenderness present. No swelling.     Cervical back: Normal range of motion  and neck supple.     Right lower leg: No edema.     Left lower leg: No edema.     Comments: Mild tenderness over R flank-laterally  No crepitus over ribs No spinal tenderness  Nl rom of spine  Lymphadenopathy:     Cervical: No cervical adenopathy.  Skin:    General: Skin is  warm and dry.     Coloration: Skin is not pale.     Findings: No erythema or rash.  Neurological:     Mental Status: She is alert.     Motor: No weakness.  Psychiatric:        Mood and Affect: Mood normal. Affect is flat.        Cognition and Memory: Cognition normal.           Assessment & Plan:   Problem List Items Addressed This Visit      Genitourinary   Hematuria, microscopic    Urine sent for cx  KUB to look for kidney stones (doubtful given improvement in symptoms)      Relevant Orders   Urine Culture     Other   Right flank pain - Primary    R flank radiating to R upper abdomen in pt with h/o gallstones  Also some blood on ua  Disc differential incl msk pain, gallbladder cause, renal stone, uti  Reassuring exam  Lab today -cbc/bmet and hepatic KUB today Urine cx pending Will use heat prn/analgesics and monitor symptoms      Relevant Orders   DG Abd 1 View   Right lateral abdominal pain    Along with R flank pain -see A/P  Disc poss of gallstone etiology  inst to avoid fatty foods       Relevant Orders   Urine Culture   CBC with Differential/Platelet (Completed)   Basic metabolic panel (Completed)   Hepatic function panel (Completed)   DG Abd 1 View    Other Visit Diagnoses    Flank pain       Relevant Orders   POCT Urinalysis Dipstick (Automated) (Completed)   Urine Culture   CBC with Differential/Platelet (Completed)   Basic metabolic panel (Completed)   Hepatic function panel (Completed)   DG Abd 1 View

## 2019-06-12 NOTE — Assessment & Plan Note (Signed)
Urine sent for cx  KUB to look for kidney stones (doubtful given improvement in symptoms)

## 2019-06-12 NOTE — Telephone Encounter (Signed)
Pt already has appt with R Baity NP 06/12/19 at 9:15.

## 2019-06-13 ENCOUNTER — Telehealth: Payer: Self-pay | Admitting: Family Medicine

## 2019-06-13 LAB — URINE CULTURE
MICRO NUMBER:: 10474381
SPECIMEN QUALITY:: ADEQUATE

## 2019-06-13 NOTE — Telephone Encounter (Signed)
Pt states she had missed a call from our office. She was unsure if this was related to the results from her visit yesterday and I was unable to find any notes where anyone called.

## 2019-06-13 NOTE — Telephone Encounter (Signed)
I left voicemail for patient to return phone call.  

## 2019-06-16 ENCOUNTER — Telehealth: Payer: Self-pay

## 2019-06-16 NOTE — Telephone Encounter (Signed)
LVM for pt to call for lab results.  Dr. Glori Bickers msg below.  Tower, Chloe Fanny, MD  06/14/2019 9:16 AM EDT    Urine culture is negative  If she still has pain, I would consider ref to gen surg to discuss her gallstones Let me know if open to that and if she prefers [place] or [employer]  Will cc to pcp

## 2019-06-16 NOTE — Telephone Encounter (Signed)
Left detailed message on voicemail.  

## 2019-06-18 ENCOUNTER — Telehealth: Payer: Self-pay | Admitting: Family Medicine

## 2019-06-18 DIAGNOSIS — R109 Unspecified abdominal pain: Secondary | ICD-10-CM

## 2019-06-18 NOTE — Telephone Encounter (Signed)
Received a call from patient asking for a General Surgeon referral. She has the focus plan and needs a Referral. She already has an appointment with Dr Aviva Signs with [place], who is with [facility] and in her network.

## 2019-06-19 ENCOUNTER — Ambulatory Visit: Payer: No Typology Code available for payment source | Admitting: General Surgery

## 2019-06-19 ENCOUNTER — Encounter: Payer: Self-pay | Admitting: General Surgery

## 2019-06-19 ENCOUNTER — Ambulatory Visit (INDEPENDENT_AMBULATORY_CARE_PROVIDER_SITE_OTHER): Payer: No Typology Code available for payment source | Admitting: General Surgery

## 2019-06-19 ENCOUNTER — Other Ambulatory Visit: Payer: Self-pay

## 2019-06-19 VITALS — BP 125/83 | HR 67 | Temp 98.1°F | Ht 64.0 in | Wt 225.0 lb

## 2019-06-19 DIAGNOSIS — R1011 Right upper quadrant pain: Secondary | ICD-10-CM

## 2019-06-20 NOTE — Progress Notes (Signed)
[name]; ON:[id number]; [birth year]   HPI Patient is a 54 year old black female who was referred to my care for evaluation treatment of gallstones.  Patient has a known history of gallstones, her last significant episode of right upper quadrant abdominal pain was in 2015.  She recently has had nonspecific right flank pain.  She denies any nausea, vomiting, bloating, fatty food intolerance, fever, chills, or jaundice.  She points to the costal margin as the area of pain.  She also has a history of constipation.  Her pain is somewhat relieved when she has a bowel movement which is every 2 days.  She did have a KUB performed which revealed stool in the right colon.  No other abnormalities were noted.  She currently has 0 out of 10 pain.  Liver enzyme tests were within normal limits recently. Past Medical History:  Diagnosis Date  . Anemia   . Anxiety state, unspecified   . GERD (gastroesophageal reflux disease)   . Headache(784.0)   . Other constipation   . SVT (supraventricular tachycardia) (HCC)     Past Surgical History:  Procedure Laterality Date  . CARDIAC CATHETERIZATION  12/09/03   Normal  . CERVICAL DISC SURGERY     Ruptured disc repair / and approach c5/6, c6/7; plate  . HERNIA REPAIR     umbilical  . LAPAROSCOPIC ASSISTED VAGINAL HYSTERECTOMY N/A 03/09/2015   Procedure: LAPAROSCOPIC ASSISTED VAGINAL HYSTERECTOMY;  Surgeon: Jonnie Kind, MD;  Location: [facility];  Service: Gynecology;  Laterality: N/A;  . OPEN REDUCTION INTERNAL FIXATION (ORIF) DISTAL RADIAL FRACTURE Right 03/01/2018   Procedure: OPEN REDUCTION INTERNAL FIXATION (ORIF) DISTAL RADIAL FRACTURE;  Surgeon: Iran Planas, MD;  Location: [facility];  Service: Orthopedics;  Laterality: Right;  . RECTOCELE REPAIR N/A 03/09/2015   Procedure: POSTERIOR REPAIR (RECTOCELE);  Surgeon: Jonnie Kind, MD;  Location: [facility];  Service: Gynecology;  Laterality: N/A;  . SALPINGOOPHORECTOMY Bilateral 03/09/2015   Procedure: SALPINGO  OOPHORECTOMY;  Surgeon: Jonnie Kind, MD;  Location: [facility];  Service: Gynecology;  Laterality: Bilateral;  . TUBAL LIGATION    . VAGINAL DELIVERY  90, 97, 99, 03   x 4     Family History  Problem Relation Age of Onset  . Deep vein thrombosis Mother        cerebral blood clot   . Heart attack Mother   . Heart failure Father   . Diabetes Maternal Grandmother   . Stroke Maternal Grandmother   . Cancer Maternal Grandfather        Prostate   . Depression Maternal Uncle   . Hypertension Neg Hx   . Colon cancer Neg Hx   . Breast cancer Neg Hx     Current Outpatient Medications on File Prior to Visit  Medication Sig Dispense Refill  . Cholecalciferol (VITAMIN D3) 50 MCG (2000 UT) TABS Take 2,000 Units by mouth.    . estradiol (CLIMARA - DOSED IN MG/24 HR) 0.1 mg/24hr patch PLACE 1 PATCH (0.1 MG TOTAL) ONTO THE SKIN ONCE A WEEK. -- MUST FILL AT [facility] 4 patch 5  . gabapentin (NEURONTIN) 100 MG capsule Take 1-2 capsules (100-200 mg total) by mouth 3 (three) times daily as needed. 90 capsule 3  . Multiple Vitamins-Minerals (MULTI-VITAMIN GUMMIES) CHEW Chew 2 tablets by mouth daily.     . [DISCONTINUED] estradiol (CLIMARA - DOSED IN MG/24 HR) 0.1 mg/24hr patch PLACE 1 PATCH (0.1 MG TOTAL) ONTO THE SKIN ONCE A WEEK. -- MUST FILL AT [facility]  12 patch 1   No current facility-administered medications on file prior to visit.    Allergies  Allergen Reactions  . Requip [Ropinirole Hcl]     Intolerant  . Sulfonamide Derivatives Rash    Social History   Substance and Sexual Activity  Alcohol Use No  . Alcohol/week: 0.0 standard drinks    Social History   Tobacco Use  Smoking Status Never Smoker  Smokeless Tobacco Never Used    Review of Systems  Constitutional: Negative.   HENT: Positive for sinus pain.   Eyes: Negative.   Respiratory: Positive for shortness of breath.   Cardiovascular: Negative.   Gastrointestinal: Positive for abdominal pain and heartburn.  Genitourinary:  Positive for frequency.  Musculoskeletal: Positive for back pain, joint pain and neck pain.  Skin: Negative.   Neurological: Positive for sensory change and headaches.  Endo/Heme/Allergies: Negative.   Psychiatric/Behavioral: Negative.     Objective   Vitals:   06/19/19 1021  BP: 125/83  Pulse: 67  Temp: 98.1 F (36.7 C)  SpO2: 98%    Physical Exam Vitals reviewed.  Constitutional:      Appearance: Normal appearance. She is obese. She is not ill-appearing.  HENT:     Head: Normocephalic and atraumatic.  Eyes:     General: No scleral icterus. Cardiovascular:     Rate and Rhythm: Normal rate and regular rhythm.     Heart sounds: Normal heart sounds. No murmur. No friction rub. No gallop.   Pulmonary:     Effort: Pulmonary effort is normal. No respiratory distress.     Breath sounds: Normal breath sounds. No stridor. No wheezing, rhonchi or rales.  Abdominal:     General: Bowel sounds are normal. There is no distension.     Palpations: Abdomen is soft. There is no mass.     Tenderness: There is abdominal tenderness. There is no guarding or rebound.     Hernia: No hernia is present.     Comments: No right upper quadrant abdominal pain is noted.  Laterally along the right costal margin, I was able to palpate a rib attachment to the costal margin which with direct palpation, caused a clicking feeling and reproduced her pain.  Skin:    General: Skin is warm and dry.  Neurological:     Mental Status: She is alert and oriented to person, place, and time.    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  She has no evidence of biliary colic at this time.  Her gallstones are asymptomatic.  Patient denies any trauma to the rib cage. Plan   No need for cholecystectomy at this time.  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  I doubt further exploration of  this will be helpful at this time.  I did explain this to the patient.  I told her to return to my care should she have any further issues.  Follow-up as needed.

## 2019-06-23 ENCOUNTER — Encounter: Payer: Self-pay | Admitting: Internal Medicine

## 2019-07-03 ENCOUNTER — Other Ambulatory Visit: Payer: Self-pay

## 2019-07-03 ENCOUNTER — Ambulatory Visit (INDEPENDENT_AMBULATORY_CARE_PROVIDER_SITE_OTHER): Payer: No Typology Code available for payment source | Admitting: Family Medicine

## 2019-07-03 ENCOUNTER — Encounter: Payer: Self-pay | Admitting: Family Medicine

## 2019-07-03 VITALS — BP 124/70 | HR 86 | Temp 97.2°F | Ht 64.0 in | Wt 224.2 lb

## 2019-07-03 DIAGNOSIS — I868 Varicose veins of other specified sites: Secondary | ICD-10-CM

## 2019-07-03 DIAGNOSIS — W57XXXA Bitten or stung by nonvenomous insect and other nonvenomous arthropods, initial encounter: Secondary | ICD-10-CM | POA: Diagnosis not present

## 2019-07-03 DIAGNOSIS — N951 Menopausal and female climacteric states: Secondary | ICD-10-CM | POA: Diagnosis not present

## 2019-07-03 DIAGNOSIS — S80261A Insect bite (nonvenomous), right knee, initial encounter: Secondary | ICD-10-CM

## 2019-07-03 LAB — FOLLICLE STIMULATING HORMONE: FSH: 46.8 m[IU]/mL

## 2019-07-03 MED ORDER — TRIAMCINOLONE ACETONIDE 0.1 % EX CREA: 1.0000 "application " | TOPICAL_CREAM | Freq: Two times a day (BID) | CUTANEOUS | 0 refills | Status: DC | PRN

## 2019-07-03 NOTE — Patient Instructions (Addendum)
Go to the lab on the way out.   If you have mychart we'll likely use that to update you.     Likely residual irritation from the tick bite but not infection.  I don't see any foreign body.  Use TAC cream as needed.   Take care.  Glad to see you.

## 2019-07-03 NOTE — Progress Notes (Signed)
This visit occurred during the SARS-CoV-2 public health emergency.  Safety protocols were in place, including screening questions prior to the visit, additional usage of staff PPE, and extensive cleaning of exam room while observing appropriate contact time as indicated for disinfecting solutions.  Her aches got better, unclear if from stopping estradiol patch or stopping using a heavy blanket or both.  She has been off patch for >1 month.  She had hysterectomy so no menses.  We talked about checking [facility] today.  She is off estradiol patch and not having hot flashes.    She had tick bite ~1st week of May.  L popliteal area.  Still irritated and itchy locally.  She thought the tick was fully removed.  No FCNAVD.  No bullseye rash.    She has some bruising on the L leg episodically, she has some small varicose veins on the L leg at baseline.    nad ncat rrr ctab 44mm papular lesion in the L popliteal area.  No foreign body seen with illumination or magnification. She does have some small superficial varicose veins in the left leg.

## 2019-07-06 DIAGNOSIS — N951 Menopausal and female climacteric states: Secondary | ICD-10-CM | POA: Insufficient documentation

## 2019-07-06 DIAGNOSIS — I868 Varicose veins of other specified sites: Secondary | ICD-10-CM | POA: Insufficient documentation

## 2019-07-06 NOTE — Assessment & Plan Note (Addendum)
Likely with minimal residual local irritation but no foreign body seen and in the absence of systemic symptoms that I would not pursue this further, other than using as needed triamcinolone cream.  Routine cautions given to patient and she can update me as needed.

## 2019-07-06 NOTE — Assessment & Plan Note (Signed)
Benign.  No treatment needed other than as needed compression stockings.  Update me as needed.

## 2019-07-06 NOTE — Assessment & Plan Note (Signed)
See notes on [facility]

## 2019-07-30 NOTE — Progress Notes (Deleted)
Referring Provider: Aviva Signs, MD Primary Care Physician:  Tonia Ghent, MD Primary Gastroenterologist:  Dr. Abbey Chatters (Dr. Gala Romney following for now)  No chief complaint on file.   HPI:   [name] is a 54 y.o. female presenting today at the request of Aviva Signs, MD for right-sided abdominal pain and constipation.  Patient recently saw Dr. Arnoldo Morale on 06/19/2019 for gallstones.  She had nonspecific right flank pain without nausea, vomiting, bloating, fatty food intolerance, fever, chills, or jaundice.  Pain was located at the costal margin.  History of constipation with some improvement in abdominal pain with bowel movements.  Per Dr. Arnoldo Morale note, states KUB performed which revealed stool in the right colon.  Suspected RUQ/flank pain secondary to cartilaginous rib disruption.  Not much to do about this.  Doubted any further exploration would be helpful.  This was explained to patient.  Recent labs completed 06/12/2019 with CBC within normal limits, BMP and HFP within normal limits, urine culture negative.  Cologuard negative in December 2020.  Today:    Past Medical History:  Diagnosis Date  . Anemia   . Anxiety state, unspecified   . GERD (gastroesophageal reflux disease)   . Headache(784.0)   . Other constipation   . SVT (supraventricular tachycardia) (HCC)     Past Surgical History:  Procedure Laterality Date  . CARDIAC CATHETERIZATION  12/09/03   Normal  . CERVICAL DISC SURGERY     Ruptured disc repair / and approach c5/6, c6/7; plate  . HERNIA REPAIR     umbilical  . LAPAROSCOPIC ASSISTED VAGINAL HYSTERECTOMY N/A 03/09/2015   Procedure: LAPAROSCOPIC ASSISTED VAGINAL HYSTERECTOMY;  Surgeon: Jonnie Kind, MD;  Location: [facility];  Service: Gynecology;  Laterality: N/A;  . OPEN REDUCTION INTERNAL FIXATION (ORIF) DISTAL RADIAL FRACTURE Right 03/01/2018   Procedure: OPEN REDUCTION INTERNAL FIXATION (ORIF) DISTAL RADIAL FRACTURE;  Surgeon: Iran Planas, MD;   Location: [facility];  Service: Orthopedics;  Laterality: Right;  . RECTOCELE REPAIR N/A 03/09/2015   Procedure: POSTERIOR REPAIR (RECTOCELE);  Surgeon: Jonnie Kind, MD;  Location: [facility];  Service: Gynecology;  Laterality: N/A;  . SALPINGOOPHORECTOMY Bilateral 03/09/2015   Procedure: SALPINGO OOPHORECTOMY;  Surgeon: Jonnie Kind, MD;  Location: [facility];  Service: Gynecology;  Laterality: Bilateral;  . TUBAL LIGATION    . VAGINAL DELIVERY  90, 97, 99, 03   x 4     Current Outpatient Medications  Medication Sig Dispense Refill  . Cholecalciferol (VITAMIN D3) 50 MCG (2000 UT) TABS Take 2,000 Units by mouth.    . gabapentin (NEURONTIN) 100 MG capsule Take 1-2 capsules (100-200 mg total) by mouth 3 (three) times daily as needed. 90 capsule 3  . Multiple Vitamins-Minerals (MULTI-VITAMIN GUMMIES) CHEW Chew 2 tablets by mouth daily.     Marland Kitchen triamcinolone cream (KENALOG) 0.1 % Apply 1 application topically 2 (two) times daily as needed. 30 g 0   No current facility-administered medications for this visit.    Allergies as of 07/31/2019 - Review Complete 07/03/2019  Allergen Reaction Noted  . Requip [ropinirole hcl]  12/31/2018  . Sulfonamide derivatives Rash     Family History  Problem Relation Age of Onset  . Deep vein thrombosis Mother        cerebral blood clot   . Heart attack Mother   . Heart failure Father   . Diabetes Maternal Grandmother   . Stroke Maternal Grandmother   . Cancer Maternal Grandfather  Prostate   . Depression Maternal Uncle   . Hypertension Neg Hx   . Colon cancer Neg Hx   . Breast cancer Neg Hx     Social History   Socioeconomic History  . Marital status: Married    Spouse name: [name]  . Number of children: 4  . Years of education: 52  . Highest education level: Not on file  Occupational History  . Occupation: [employer]     Employer: [place]     Employer: [employer]  Tobacco Use  . Smoking  status: Never Smoker  . Smokeless tobacco: Never Used  Vaping Use  . Vaping Use: Never used  Substance and Sexual Activity  . Alcohol use: No    Alcohol/week: 0.0 standard drinks  . Drug use: No  . Sexual activity: Not Currently    Birth control/protection: Surgical  Other Topics Concern  . Not on file  Social History Narrative   Patient lives with her husband [name] ) and her children.   Patient has four children.   Patient works full-time.   Patient has a college education.   Patient is right-handed.   Patient drinks caffeine- once or twice a week.     Social Determinants of Health   Financial Resource Strain:   . Difficulty of Paying Living Expenses:   Food Insecurity:   . Worried About Charity fundraiser in the Last Year:   . Arboriculturist in the Last Year:   Transportation Needs:   . Film/video editor (Medical):   Marland Kitchen Lack of Transportation (Non-Medical):   Physical Activity:   . Days of Exercise per Week:   . Minutes of Exercise per Session:   Stress:   . Feeling of Stress :   Social Connections:   . Frequency of Communication with Friends and Family:   . Frequency of Social Gatherings with Friends and Family:   . Attends Religious Services:   . Active Member of Clubs or Organizations:   . Attends Archivist Meetings:   Marland Kitchen Marital Status:   Intimate Partner Violence:   . Fear of Current or Ex-Partner:   . Emotionally Abused:   Marland Kitchen Physically Abused:   . Sexually Abused:     Review of Systems: Gen: Denies any fever, chills, fatigue, weight loss, lack of appetite.  CV: Denies chest pain, heart palpitations, peripheral edema, syncope.  Resp: Denies shortness of breath at rest or with exertion. Denies wheezing or cough.  GI: Denies dysphagia or odynophagia. Denies jaundice, hematemesis, fecal incontinence. GU : Denies urinary burning, urinary frequency, urinary hesitancy MS: Denies joint pain, muscle weakness, cramps, or limitation of movement.   Derm: Denies rash, itching, dry skin Psych: Denies depression, anxiety, memory loss, and confusion Heme: Denies bruising, bleeding, and enlarged lymph nodes.  Physical Exam: LMP 01/31/2015  General:   Alert and oriented. Pleasant and cooperative. Well-nourished and well-developed.  Head:  Normocephalic and atraumatic. Eyes:  Without icterus, sclera clear and conjunctiva pink.  Ears:  Normal auditory acuity. Nose:  No deformity, discharge,  or lesions. Mouth:  No deformity or lesions, oral mucosa pink.  Neck:  Supple, without mass or thyromegaly. Lungs:  Clear to auscultation bilaterally. No wheezes, rales, or rhonchi. No distress.  Heart:  S1, S2 present without murmurs appreciated.  Abdomen:  +BS, soft, non-tender and non-distended. No HSM noted. No guarding or rebound. No masses appreciated.  Rectal:  Deferred  Msk:  Symmetrical without gross deformities. Normal posture. Pulses:  Normal pulses noted. Extremities:  Without clubbing or edema. Neurologic:  Alert and  oriented x4;  grossly normal neurologically. Skin:  Intact without significant lesions or rashes. Cervical Nodes:  No significant cervical adenopathy. Psych:  Alert and cooperative. Normal mood and affect.

## 2019-07-31 ENCOUNTER — Ambulatory Visit: Payer: No Typology Code available for payment source | Admitting: Gastroenterology

## 2019-09-19 ENCOUNTER — Other Ambulatory Visit: Payer: Self-pay

## 2019-09-19 ENCOUNTER — Other Ambulatory Visit: Payer: No Typology Code available for payment source

## 2019-09-19 DIAGNOSIS — Z20822 Contact with and (suspected) exposure to covid-19: Secondary | ICD-10-CM

## 2019-09-20 LAB — SARS-COV-2, NAA 2 DAY TAT

## 2019-09-20 LAB — NOVEL CORONAVIRUS, NAA: SARS-CoV-2, NAA: NOT DETECTED

## 2019-09-25 ENCOUNTER — Ambulatory Visit: Payer: No Typology Code available for payment source | Admitting: Adult Health

## 2019-09-25 ENCOUNTER — Encounter: Payer: Self-pay | Admitting: Adult Health

## 2019-09-25 VITALS — BP 128/78 | HR 76 | Ht 65.0 in | Wt 227.0 lb

## 2019-09-25 DIAGNOSIS — B369 Superficial mycosis, unspecified: Secondary | ICD-10-CM | POA: Diagnosis not present

## 2019-09-25 DIAGNOSIS — B3731 Acute candidiasis of vulva and vagina: Secondary | ICD-10-CM

## 2019-09-25 DIAGNOSIS — B373 Candidiasis of vulva and vagina: Secondary | ICD-10-CM

## 2019-09-25 LAB — POCT WET PREP (WET MOUNT)

## 2019-09-25 MED ORDER — NYSTATIN-TRIAMCINOLONE 100000-0.1 UNIT/GM-% EX CREA: 1.0000 "application " | TOPICAL_CREAM | Freq: Two times a day (BID) | CUTANEOUS | 0 refills | Status: DC

## 2019-09-25 MED ORDER — NYSTATIN 100000 UNIT/GM EX POWD: 1.0000 "application " | Freq: Two times a day (BID) | CUTANEOUS | 3 refills | Status: DC

## 2019-09-25 MED ORDER — FLUCONAZOLE 150 MG PO TABS: ORAL_TABLET | ORAL | 1 refills | Status: DC

## 2019-09-25 NOTE — Progress Notes (Signed)
°  Subjective:     Patient ID: [name], female   DOB: [birth year], 54 y.o.   MRN: [id number]  HPI [name] is a 54 year old black female, married, sp hysterectomy in complaining of itching, ? Yeast, no vaginal discharge but has rash under panniculus and inner thighs and has used lotrimin spray and it has helped. She has stopped her estrogen patch and says she is sweating more. PCP is Dr [name].   Review of Systems +itching ? Yeast Has rash under panniculus and inner thighs Reviewed past medical,surgical, social and family history. Reviewed medications and allergies.     Objective:   Physical Exam BP 128/78 (BP Location: Left Arm, Patient Position: Sitting, Cuff Size: Large)    Pulse 76    Ht 5\' 5"  (1.651 m)    Wt 227 lb (103 kg)    LMP 01/31/2015    BMI 37.77 kg/m  Skin warm and dry. Has skin changes under panniculus and in groin, has powders there too. Pelvic: external genitalia is normal in appearance no lesions, vagina:scant white discharge without odor,side walls red,urethra has no lesions or masses noted, cervix and uterus are absent, adnexa: no masses or tenderness noted. Bladder is non tender and no masses felt. Wet prep: + for yeast AA is 0 Fall risk is low PHQ 9 score is 3   [facility] - 09/25/19 1133      Pregnancy Intention Screening   Does the patient want to become pregnant in the next year? N/A    Does the patient's partner want to become pregnant in the next year? N/A    Would the patient like to discuss contraceptive options today? N/A      Contraception Wrap Up   Current Method No Method - Other Reason   Hysterectomy   End Method No Method - Other Reason   hysterectomy   Contraception Counseling Provided No   hysterectomy        Examination chaperoned by Chloe Dee LPN     Assessment:     1. Yeast vaginitis Will rx diflucan  Meds ordered this encounter  Medications   fluconazole (DIFLUCAN) 150 MG tablet    Sig: Take 1 now and 1 in 3 days     Dispense:  2 tablet    Refill:  1    Order Specific Question:   Supervising Provider    Answer:   [name] [[id number]]   nystatin-triamcinolone (MYCOLOG II) cream    Sig: Apply 1 application topically 2 (two) times daily.    Dispense:  30 g    Refill:  0    Order Specific Question:   Supervising Provider    Answer:   [name], [name] [[id number]]   nystatin (NYSTATIN) powder    Sig: Apply 1 application topically 2 (two) times daily.    Dispense:  45 g    Refill:  3    Order Specific Question:   Supervising Provider    Answer:   [name], [name] [[id number]]     2. Infection of skin and subcutaneous tissue due to fungus Will rx nystatin powders to help with moisture and mytrex cream     Plan:     Follow up prn And let me know if wants to restart estrogen  patches

## 2019-10-23 ENCOUNTER — Other Ambulatory Visit: Payer: No Typology Code available for payment source | Admitting: Obstetrics and Gynecology

## 2019-10-23 ENCOUNTER — Encounter: Payer: Self-pay | Admitting: Obstetrics and Gynecology

## 2019-10-23 ENCOUNTER — Ambulatory Visit (INDEPENDENT_AMBULATORY_CARE_PROVIDER_SITE_OTHER): Payer: No Typology Code available for payment source | Admitting: Obstetrics and Gynecology

## 2019-10-23 VITALS — BP 128/80 | HR 71 | Ht 65.0 in | Wt 230.0 lb

## 2019-10-23 DIAGNOSIS — Z01419 Encounter for gynecological examination (general) (routine) without abnormal findings: Secondary | ICD-10-CM

## 2019-10-23 NOTE — Progress Notes (Signed)
  Assessment:  Annual Gyn Exam S/p LAVH Plan:  1. pap smear not done, next pap due none required 2. return  prn 3    Annual mammogram advised after age 55 Subjective:  [name] is a 54 y.o. female [phone] who presents for annual exam. Patient's last menstrual period was 01/31/2015. The patient has complaints today of annual check  The following portions of the patient's history were reviewed and updated as appropriate: allergies, current medications, past family history, past medical history, past social history, past surgical history and problem list. Past Medical History:  Diagnosis Date  . Anemia   . Anxiety state, unspecified   . GERD (gastroesophageal reflux disease)   . Headache(784.0)   . Other constipation   . SVT (supraventricular tachycardia) (HCC)     Past Surgical History:  Procedure Laterality Date  . CARDIAC CATHETERIZATION  12/09/03   Normal  . CERVICAL DISC SURGERY     Ruptured disc repair / and approach c5/6, c6/7; plate  . HERNIA REPAIR     umbilical  . LAPAROSCOPIC ASSISTED VAGINAL HYSTERECTOMY N/A 03/09/2015   Procedure: LAPAROSCOPIC ASSISTED VAGINAL HYSTERECTOMY;  Surgeon: Jonnie Kind, MD;  Location: [facility];  Service: Gynecology;  Laterality: N/A;  . OPEN REDUCTION INTERNAL FIXATION (ORIF) DISTAL RADIAL FRACTURE Right 03/01/2018   Procedure: OPEN REDUCTION INTERNAL FIXATION (ORIF) DISTAL RADIAL FRACTURE;  Surgeon: Iran Planas, MD;  Location: [facility];  Service: Orthopedics;  Laterality: Right;  . RECTOCELE REPAIR N/A 03/09/2015   Procedure: POSTERIOR REPAIR (RECTOCELE);  Surgeon: Jonnie Kind, MD;  Location: [facility];  Service: Gynecology;  Laterality: N/A;  . SALPINGOOPHORECTOMY Bilateral 03/09/2015   Procedure: SALPINGO OOPHORECTOMY;  Surgeon: Jonnie Kind, MD;  Location: [facility];  Service: Gynecology;  Laterality: Bilateral;  . TUBAL LIGATION    . VAGINAL DELIVERY  90, 97, 99, 03   x 4      Current Outpatient Medications:  .  Cholecalciferol  (VITAMIN D3) 50 MCG (2000 UT) TABS, Take 2,000 Units by mouth., Disp: , Rfl:  .  Multiple Vitamins-Minerals (MULTI-VITAMIN GUMMIES) CHEW, Chew 2 tablets by mouth daily. , Disp: , Rfl:  .  estradiol (CLIMARA - DOSED IN MG/24 HR) 0.1 mg/24hr patch, 0.1 mg once a week., Disp: , Rfl:   Review of Systems Constitutional: negative Gastrointestinal: negative Genitourinary:   Objective:  BP 128/80 (BP Location: Right Arm, Patient Position: Sitting, Cuff Size: Normal)   Pulse 71   Ht 5\' 5"  (1.651 m)   Wt 230 lb (104.3 kg)   LMP 01/31/2015   BMI 38.27 kg/m    BMI: Body mass index is 38.27 kg/m.  General Appearance: Alert, appropriate appearance for age. No acute distress HEENT: Grossly normal Neck / Thyroid:  Cardiovascular: RRR; normal S1, S2, no murmur Lungs: CTA bilaterally Back: No CVAT Breast Exam: No dimpling, nipple retraction or discharge. No masses or nodes. and No masses or nodes.No dimpling, nipple retraction or discharge. Gastrointestinal: Soft, non-tender, no masses or organomegaly Pelvic Exam: External genitalia: normal general appearance Vaginal: normal mucosa without prolapse or lesions Cervix: removed surgically Adnexa: removed surgically Rectovaginal: normal rectal, no masses and guaiac negative stool obtained Lymphatic Exam: Non-palpable nodes in neck, clavicular, axillary, or inguinal regions Skin: no rash or abnormalities Neurologic: Normal gait and speech, no tremor  Psychiatric: Alert and oriented, appropriate affect.  Urinalysis:normal and Not done  Mallory Shirk. MD Pgr [phone] 10:47 PM

## 2019-10-28 ENCOUNTER — Ambulatory Visit: Payer: No Typology Code available for payment source | Attending: Internal Medicine

## 2019-10-28 DIAGNOSIS — Z23 Encounter for immunization: Secondary | ICD-10-CM

## 2019-10-28 NOTE — Progress Notes (Signed)
   Covid-19 Vaccination Clinic  Name:  TYJAE SHVARTSMAN    MRN: [id number] DOB: Jun 17, 1965  10/28/2019  Ms. Sick was observed post Covid-19 immunization for 15 minutes without incident. She was provided with Vaccine Information Sheet and instruction to access the V-Safe system.   Ms. Reiland was instructed to call 911 with any severe reactions post vaccine: Marland Kitchen Difficulty breathing  . Swelling of face and throat  . A fast heartbeat  . A bad rash all over body  . Dizziness and weakness

## 2019-12-11 ENCOUNTER — Ambulatory Visit (INDEPENDENT_AMBULATORY_CARE_PROVIDER_SITE_OTHER): Payer: No Typology Code available for payment source

## 2019-12-11 DIAGNOSIS — Z23 Encounter for immunization: Secondary | ICD-10-CM

## 2020-03-03 ENCOUNTER — Other Ambulatory Visit (HOSPITAL_COMMUNITY): Payer: Self-pay | Admitting: Adult Health

## 2020-03-03 DIAGNOSIS — Z1231 Encounter for screening mammogram for malignant neoplasm of breast: Secondary | ICD-10-CM

## 2020-03-08 ENCOUNTER — Inpatient Hospital Stay (HOSPITAL_COMMUNITY): Admission: RE | Admit: 2020-03-08 | Payer: No Typology Code available for payment source | Source: Ambulatory Visit

## 2020-03-09 ENCOUNTER — Ambulatory Visit (HOSPITAL_COMMUNITY)
Admission: RE | Admit: 2020-03-09 | Discharge: 2020-03-09 | Disposition: A | Discharge status: Home / Self Care | Payer: No Typology Code available for payment source | Source: Ambulatory Visit | Attending: Adult Health | Admitting: Adult Health

## 2020-03-09 ENCOUNTER — Other Ambulatory Visit: Payer: Self-pay

## 2020-03-09 DIAGNOSIS — Z1231 Encounter for screening mammogram for malignant neoplasm of breast: Secondary | ICD-10-CM | POA: Insufficient documentation

## 2020-03-22 ENCOUNTER — Other Ambulatory Visit: Payer: Self-pay | Admitting: *Deleted

## 2020-03-22 MED ORDER — ESTRADIOL 0.1 MG/24HR TD PTWK: 0.1000 mg | MEDICATED_PATCH | TRANSDERMAL | 5 refills | Status: DC

## 2020-03-26 ENCOUNTER — Other Ambulatory Visit: Payer: No Typology Code available for payment source

## 2020-03-26 DIAGNOSIS — Z20822 Contact with and (suspected) exposure to covid-19: Secondary | ICD-10-CM

## 2020-03-27 LAB — SARS-COV-2, NAA 2 DAY TAT

## 2020-03-27 LAB — NOVEL CORONAVIRUS, NAA: SARS-CoV-2, NAA: NOT DETECTED

## 2020-05-17 ENCOUNTER — Ambulatory Visit (INDEPENDENT_AMBULATORY_CARE_PROVIDER_SITE_OTHER): Payer: No Typology Code available for payment source | Admitting: Primary Care

## 2020-05-17 ENCOUNTER — Other Ambulatory Visit: Payer: Self-pay

## 2020-05-17 VITALS — BP 136/82 | HR 86 | Temp 97.6°F | Ht 65.0 in | Wt 230.0 lb

## 2020-05-17 DIAGNOSIS — R21 Rash and other nonspecific skin eruption: Secondary | ICD-10-CM | POA: Diagnosis not present

## 2020-05-17 MED ORDER — PREDNISONE 20 MG PO TABS: ORAL_TABLET | ORAL | 0 refills | Status: DC

## 2020-05-17 NOTE — Progress Notes (Signed)
Subjective:    Patient ID: [name], female    DOB: 1965-06-18, 55 y.o.   MRN: [id number]  HPI  ZERINA HALLINAN is a very pleasant 55 y.o. female patient of Dr. Damita Dunnings with a history of eczema, skin lesions, fungal skin infection who presents today with a chief complaint of rash.  Her rash is located to the anterior neck and chest for which she noticed 2-3 weeks ago. She's also noticed the rash spreading under bilateral axilla, this has improved.   She's applied OTC benadryl cream with temporary improvement in itching. She tried hydrocortisone cream OTC without improvement. Overall the rash has improved but is without resolve.   No new lotions, detergents, soaps or shampoos. No new medicines, vitamins, supplements. No new pets. No recent outdoor exposure or poison ivy exposure. No bonfire or smoke exposure.  No recent motel or hotel stay or new beds.   No fevers/chills, oral lesions, new joint pains, tick bites, abdominal pain, nausea.   She does work in patient care, isn't sure if she contracted something from a patient.   Review of Systems  Constitutional: Negative for fever.  HENT: Negative for trouble swallowing.   Respiratory: Negative for cough and shortness of breath.   Skin: Positive for rash. Negative for wound.         Past Medical History:  Diagnosis Date  . Anemia   . Anxiety state, unspecified   . GERD (gastroesophageal reflux disease)   . Headache(784.0)   . Other constipation   . SVT (supraventricular tachycardia) (HCC)     Social History   Socioeconomic History  . Marital status: Married    Spouse name: [name]  . Number of children: 4  . Years of education: 65  . Highest education level: Not on file  Occupational History  . Occupation: [employer]     Employer: [employer]     Employer: [employer]  Tobacco Use  . Smoking status: Never Smoker  . Smokeless tobacco: Never Used  Vaping Use  .  Vaping Use: Never used  Substance and Sexual Activity  . Alcohol use: No    Alcohol/week: 0.0 standard drinks  . Drug use: No  . Sexual activity: Yes    Birth control/protection: Surgical  Other Topics Concern  . Not on file  Social History Narrative   Patient lives with her husband [name] ) and her children.   Patient has four children.   Patient works full-time.   Patient has a college education.   Patient is right-handed.   Patient drinks caffeine- once or twice a week.     Social Determinants of Health   Financial Resource Strain: Low Risk   . Difficulty of Paying Living Expenses: Not hard at all  Food Insecurity: No Food Insecurity  . Worried About Charity fundraiser in the Last Year: Never true  . Ran Out of Food in the Last Year: Never true  Transportation Needs: No Transportation Needs  . Lack of Transportation (Medical): No  . Lack of Transportation (Non-Medical): No  Physical Activity: Insufficiently Active  . Days of Exercise per Week: 2 days  . Minutes of Exercise per Session: 20 min  Stress: Stress Concern Present  . Feeling of Stress : To some extent  Social Connections: Moderately Integrated  . Frequency of Communication with Friends and Family: Once a week  . Frequency of Social Gatherings with Friends and Family: Once  a week  . Attends Religious Services: More than 4 times per year  . Active Member of Clubs or Organizations: Yes  . Attends Archivist Meetings: More than 4 times per year  . Marital Status: Married  Human resources officer Violence: Not At Risk  . Fear of Current or Ex-Partner: No  . Emotionally Abused: No  . Physically Abused: No  . Sexually Abused: No    Past Surgical History:  Procedure Laterality Date  . CARDIAC CATHETERIZATION  12/09/03   Normal  . CERVICAL DISC SURGERY     Ruptured disc repair / and approach c5/6, c6/7; plate  . HERNIA REPAIR     umbilical  . LAPAROSCOPIC ASSISTED VAGINAL HYSTERECTOMY N/A 03/09/2015    Procedure: LAPAROSCOPIC ASSISTED VAGINAL HYSTERECTOMY;  Surgeon: Jonnie Kind, MD;  Location: [facility];  Service: Gynecology;  Laterality: N/A;  . OPEN REDUCTION INTERNAL FIXATION (ORIF) DISTAL RADIAL FRACTURE Right 03/01/2018   Procedure: OPEN REDUCTION INTERNAL FIXATION (ORIF) DISTAL RADIAL FRACTURE;  Surgeon: Iran Planas, MD;  Location: [place];  Service: Orthopedics;  Laterality: Right;  . RECTOCELE REPAIR N/A 03/09/2015   Procedure: POSTERIOR REPAIR (RECTOCELE);  Surgeon: Jonnie Kind, MD;  Location: [facility];  Service: Gynecology;  Laterality: N/A;  . SALPINGOOPHORECTOMY Bilateral 03/09/2015   Procedure: SALPINGO OOPHORECTOMY;  Surgeon: Jonnie Kind, MD;  Location: [facility];  Service: Gynecology;  Laterality: Bilateral;  . TUBAL LIGATION    . VAGINAL DELIVERY  90, 97, 99, 03   x 4     Family History  Problem Relation Age of Onset  . Deep vein thrombosis Mother        cerebral blood clot   . Heart attack Mother   . Heart failure Father   . Diabetes Maternal Grandmother   . Stroke Maternal Grandmother   . Cancer Maternal Grandfather        Prostate   . Depression Maternal Uncle   . Hypertension Neg Hx   . Colon cancer Neg Hx   . Breast cancer Neg Hx     Allergies  Allergen Reactions  . Requip [Ropinirole Hcl]     Intolerant  . Sulfonamide Derivatives Rash    Current Outpatient Medications on File Prior to Visit  Medication Sig Dispense Refill  . Cholecalciferol (VITAMIN D3) 50 MCG (2000 UT) TABS Take 2,000 Units by mouth.    . estradiol (CLIMARA - DOSED IN MG/24 HR) 0.1 mg/24hr patch Place 1 patch (0.1 mg total) onto the skin once a week. 4 patch 5  . Multiple Vitamins-Minerals (MULTI-VITAMIN GUMMIES) CHEW Chew 2 tablets by mouth daily.      No current facility-administered medications on file prior to visit.    BP 136/82   Pulse 86   Temp 97.6 F (36.4 C) (Temporal)   Ht 5\' 5"  (1.651 m)   Wt 230 lb (104.3 kg)   LMP 01/31/2015   SpO2 98%   BMI 38.27 kg/m   Objective:   Physical Exam Constitutional:      General: She is not in acute distress. Pulmonary:     Effort: Pulmonary effort is normal.  Skin:    General: Skin is warm and dry.     Findings: Rash present.     Comments: Mild, raised, red rash to bilateral anterior chest. No rash noted to axilla, posterior trunk, neck, extremities.            Assessment & Plan:      This visit occurred during the SARS-CoV-2  public health emergency.  Safety protocols were in place, including screening questions prior to the visit, additional usage of staff PPE, and extensive cleaning of exam room while observing appropriate contact time as indicated for disinfecting solutions.

## 2020-05-17 NOTE — Assessment & Plan Note (Signed)
Acute. Unclear etiology, especially given no changes in daily regimen.  Does not appear to be shingles.   Rx for prednisone course provided. She will touch base with [place] again if rash returns or dose not improve.

## 2020-05-17 NOTE — Patient Instructions (Signed)
Start prednisone 20 mg tablets for the rash. Take 2 tablets daily for 5 days.  Please contact us if the rash returns.   It was a pleasure meeting you!

## 2020-05-26 ENCOUNTER — Other Ambulatory Visit: Payer: Self-pay

## 2020-05-26 ENCOUNTER — Encounter: Payer: Self-pay | Admitting: Family Medicine

## 2020-05-26 ENCOUNTER — Ambulatory Visit (INDEPENDENT_AMBULATORY_CARE_PROVIDER_SITE_OTHER): Payer: No Typology Code available for payment source | Admitting: Family Medicine

## 2020-05-26 VITALS — BP 128/70 | HR 83 | Temp 96.9°F | Ht 65.0 in | Wt 230.6 lb

## 2020-05-26 DIAGNOSIS — R21 Rash and other nonspecific skin eruption: Secondary | ICD-10-CM

## 2020-05-26 MED ORDER — CLOTRIMAZOLE-BETAMETHASONE 1-0.05 % EX CREA: 1.0000 "application " | TOPICAL_CREAM | Freq: Two times a day (BID) | CUTANEOUS | 0 refills | Status: DC

## 2020-05-26 NOTE — Assessment & Plan Note (Signed)
Skin itching primarily on neck/some chest Pt also c/o intermittent neck swelling (not noted today)  She is anxious about this  Has had some wt gain over the past year  Exam shows some mild hyperpigmentation around neck/ no excoriations and some dry skin /pink in color on upper R breast area  Of note this improved with prednisone and then worsened again  Differential includes atopic/allergic reaction/ fungal infection (she has had before) and acanthosis nigrans in light of appearance (does not explain chest)  Px lotrisone cream to try bid and update inst pt to discuss further with her pcp at appt in mid may  ? Consider testing for insulin resistance or f/u with dermatology if not improved

## 2020-05-26 NOTE — Progress Notes (Signed)
Subjective:    Patient ID: [name], female    DOB: April 01, 1965, 55 y.o.   MRN: [id number]  This visit occurred during the SARS-CoV-2 public health emergency.  Safety protocols were in place, including screening questions prior to the visit, additional usage of staff PPE, and extensive cleaning of exam room while observing appropriate contact time as indicated for disinfecting solutions.    HPI 55 yo pt of Dr Damita Dunnings presents with rash   Wt Readings from Last 3 Encounters:  05/26/20 230 lb 9 oz (104.6 kg)  05/17/20 230 lb (104.3 kg)  10/23/19 230 lb (104.3 kg)   38.37 kg/m   She was seen by NP Clark on 05/17/20 for this rash At that time rash was noted on anterior neck and chest 2-3 weeks prior and she tried otc cortisone cream  No new exposures noted , no travel or new pets  Anda Kraft treated her with a prednisone course   Per pt rash improved on the prednisone by about 80-90% and then came right back  Still the same area  Nothing in axillae  Itchy 7/10  Tries not to  Raised bumps  Worse when hot and stressed   No h/o eczema (but it is in chart- was on breasts) No h/o allergies or asthma  No new medicines  Not in the sun a lot   This may have all started after some dental work  Deep cleaning - but no abx   Past h/o vaginal yeast inf Also under breasts in the past  No athlete's foot   Uses fragrance but does not use on upper body  Washing face with water only  Dove for body   Thinks her neck was swollen this weekend (this is off and on)  No swollen lymph nodes that she can tell   No h/o elevated glucose in the past   Patient Active Problem List   Diagnosis Date Noted  . Infection of skin and subcutaneous tissue due to fungus 09/25/2019  . Yeast vaginitis 09/25/2019  . 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 Menopausal state 07/06/2019  . Right flank pain 06/12/2019  . Right lateral abdominal pain 06/12/2019  . Hematuria, microscopic 06/12/2019  . Leg swelling  01/22/2019  . Abdominal bloating 01/22/2019  . Advance care planning 12/25/2018  . Atypical chest pain 05/02/2018  . Fatigue 11/08/2017  . Postural dizziness with presyncope 05/02/2017  . Muscle cramping 05/02/2017  . Cough 03/04/2017  . Trochanteric bursitis 01/01/2017  . Routine general medical examination at a health care facility 10/04/2015  . Pelvic pain in female 11/19/2013  . Arthropod bite 07/27/2013  . Rash and nonspecific skin eruption 07/03/2013  . Paresthesia 12/05/2012  . Low back pain 09/20/2010  . GERD (gastroesophageal reflux disease) 07/28/2010  . ADJUSTMENT REACTION WITH PHYSICAL SYMPTOMS 08/10/2009  . SKIN LESION, BENIGN 11/13/2008  . ECZEMA 01/21/2008  . CHRONIC RHINITIS 10/01/2007  . HEADACHE, CHRONIC 10/01/2007  . HYPERCHOLESTEROLEMIA 03/27/2007  . 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 CONSTIPATION, CHRONIC 03/27/2007  . ANXIETY, MILD 07/16/2006   Past Medical History:  Diagnosis Date  . Anemia   . Anxiety state, unspecified   . GERD (gastroesophageal reflux disease)   . Headache(784.0)   . Other constipation   . SVT (supraventricular tachycardia) (HCC)    Past Surgical History:  Procedure Laterality Date  . CARDIAC CATHETERIZATION  12/09/03   Normal  . CERVICAL DISC SURGERY     Ruptured disc repair / and approach c5/6, c6/7; plate  .  HERNIA REPAIR     umbilical  . LAPAROSCOPIC ASSISTED VAGINAL HYSTERECTOMY N/A 03/09/2015   Procedure: LAPAROSCOPIC ASSISTED VAGINAL HYSTERECTOMY;  Surgeon: Jonnie Kind, MD;  Location: [facility];  Service: Gynecology;  Laterality: N/A;  . OPEN REDUCTION INTERNAL FIXATION (ORIF) DISTAL RADIAL FRACTURE Right 03/01/2018   Procedure: OPEN REDUCTION INTERNAL FIXATION (ORIF) DISTAL RADIAL FRACTURE;  Surgeon: Iran Planas, MD;  Location: [place];  Service: Orthopedics;  Laterality: Right;  . RECTOCELE REPAIR N/A 03/09/2015   Procedure: POSTERIOR REPAIR (RECTOCELE);  Surgeon: Jonnie Kind, MD;  Location: [facility];  Service:  Gynecology;  Laterality: N/A;  . SALPINGOOPHORECTOMY Bilateral 03/09/2015   Procedure: SALPINGO OOPHORECTOMY;  Surgeon: Jonnie Kind, MD;  Location: [facility];  Service: Gynecology;  Laterality: Bilateral;  . TUBAL LIGATION    . VAGINAL DELIVERY  90, 97, 99, 03   x 4    Social History   Tobacco Use  . Smoking status: Never Smoker  . Smokeless tobacco: Never Used  Vaping Use  . Vaping Use: Never used  Substance Use Topics  . Alcohol use: No    Alcohol/week: 0.0 standard drinks  . Drug use: No   Family History  Problem Relation Age of Onset  . Deep vein thrombosis Mother        cerebral blood clot   . Heart attack Mother   . Heart failure Father   . Diabetes Maternal Grandmother   . Stroke Maternal Grandmother   . Cancer Maternal Grandfather        Prostate   . Depression Maternal Uncle   . Hypertension Neg Hx   . Colon cancer Neg Hx   . Breast cancer Neg Hx    Allergies  Allergen Reactions  . Requip [Ropinirole Hcl]     Intolerant  . Sulfonamide Derivatives Rash   Current Outpatient Medications on File Prior to Visit  Medication Sig Dispense Refill  . Cholecalciferol (VITAMIN D3) 50 MCG (2000 UT) TABS Take 2,000 Units by mouth.    . estradiol (CLIMARA - DOSED IN MG/24 HR) 0.1 mg/24hr patch Place 1 patch (0.1 mg total) onto the skin once a week. 4 patch 5  . Multiple Vitamins-Minerals (MULTI-VITAMIN GUMMIES) CHEW Chew 2 tablets by mouth daily.      No current facility-administered medications on file prior to visit.    Review of Systems  Constitutional: Negative for activity change, appetite change, fatigue, fever and unexpected weight change.  HENT: Negative for congestion, ear pain, rhinorrhea, sinus pressure and sore throat.   Eyes: Negative for pain, redness and visual disturbance.  Respiratory: Negative for cough, shortness of breath and wheezing.   Cardiovascular: Negative for chest pain and palpitations.  Gastrointestinal: Negative for abdominal pain, blood in  stool, constipation and diarrhea.  Endocrine: Negative for polydipsia and polyuria.  Genitourinary: Negative for dysuria, frequency and urgency.  Musculoskeletal: Negative for arthralgias, back pain and myalgias.  Skin: Positive for color change and rash. Negative for pallor.  Allergic/Immunologic: Negative for environmental allergies.  Neurological: Negative for dizziness, syncope and headaches.  Hematological: Negative for adenopathy. Does not bruise/bleed easily.  Psychiatric/Behavioral: Negative for decreased concentration and dysphoric mood. The patient is not nervous/anxious.        Objective:   Physical Exam Constitutional:      General: She is not in acute distress.    Appearance: Normal appearance. She is obese. She is not ill-appearing.  HENT:     Head: Normocephalic and atraumatic.  Eyes:  General: No scleral icterus.       Right eye: No discharge.        Left eye: No discharge.     Conjunctiva/sclera: Conjunctivae normal.     Pupils: Pupils are equal, round, and reactive to light.  Neck:     Comments: Pt noted neck is tender No adenopathy or obvious swelling noted Cardiovascular:     Rate and Rhythm: Normal rate and regular rhythm.  Pulmonary:     Effort: Pulmonary effort is normal. No respiratory distress.     Breath sounds: Normal breath sounds. No stridor. No wheezing or rales.  Musculoskeletal:     Cervical back: Normal range of motion and neck supple. Tenderness present. No rigidity.  Lymphadenopathy:     Cervical: No cervical adenopathy.  Skin:    Coloration: Skin is not jaundiced or pale.     Findings: No bruising or lesion.     Comments: Some mild hyperpigmentation around anterior and lateral neck (few raised areas- w/o scale) Small area of pink skin with scale on upper R breast   No rash in web spaces/axillae or under breasts     Neurological:     Mental Status: She is alert.     Sensory: No sensory deficit.  Psychiatric:        Mood and  Affect: Mood is anxious.           Assessment & Plan:   Problem List Items Addressed This Visit      Musculoskeletal and Integument   Rash and nonspecific skin eruption - Primary    Skin itching primarily on neck/some chest Pt also c/o intermittent neck swelling (not noted today)  She is anxious about this  Has had some wt gain over the past year  Exam shows some mild hyperpigmentation around neck/ no excoriations and some dry skin /pink in color on upper R breast area  Of note this improved with prednisone and then worsened again  Differential includes atopic/allergic reaction/ fungal infection (she has had before) and acanthosis nigrans in light of appearance (does not explain chest)  Px lotrisone cream to try bid and update inst pt to discuss further with her pcp at appt in mid may  ? Consider testing for insulin resistance or f/u with dermatology if not improved

## 2020-05-26 NOTE — Patient Instructions (Addendum)
Talk to Dr Damita Dunnings about the swelling you have had in your neck over the past 4 weeks   I want to try a topical treatment for the itchy neck and chest (lotrisone)   Get zyrtec over the counter and take 10 mg every evening   Avoid hot situations- hot shower/hot tub/ hot temperatures

## 2020-06-02 ENCOUNTER — Other Ambulatory Visit: Payer: Self-pay | Admitting: Family Medicine

## 2020-06-02 DIAGNOSIS — E785 Hyperlipidemia, unspecified: Secondary | ICD-10-CM

## 2020-06-02 DIAGNOSIS — E559 Vitamin D deficiency, unspecified: Secondary | ICD-10-CM

## 2020-06-04 ENCOUNTER — Other Ambulatory Visit (INDEPENDENT_AMBULATORY_CARE_PROVIDER_SITE_OTHER): Payer: No Typology Code available for payment source

## 2020-06-04 ENCOUNTER — Other Ambulatory Visit: Payer: No Typology Code available for payment source

## 2020-06-04 DIAGNOSIS — E559 Vitamin D deficiency, unspecified: Secondary | ICD-10-CM | POA: Diagnosis not present

## 2020-06-04 DIAGNOSIS — E785 Hyperlipidemia, unspecified: Secondary | ICD-10-CM

## 2020-06-04 NOTE — Addendum Note (Signed)
Addended by: Ellamae Sia on: 06/04/2020 02:38 PM   Modules accepted: Orders

## 2020-06-05 LAB — CBC WITH DIFFERENTIAL/PLATELET
Absolute Monocytes: 308 cells/uL (ref 200–950)
Basophils Absolute: 11 cells/uL (ref 0–200)
Basophils Relative: 0.2 %
Eosinophils Absolute: 39 cells/uL (ref 15–500)
Eosinophils Relative: 0.7 %
HCT: 40.3 % (ref 35.0–45.0)
Hemoglobin: 13.4 g/dL (ref 11.7–15.5)
Lymphs Abs: 2805 cells/uL (ref 850–3900)
MCH: 30.7 pg (ref 27.0–33.0)
MCHC: 33.3 g/dL (ref 32.0–36.0)
MCV: 92.2 fL (ref 80.0–100.0)
MPV: 9.9 fL (ref 7.5–12.5)
Monocytes Relative: 5.6 %
Neutro Abs: 2338 cells/uL (ref 1500–7800)
Neutrophils Relative %: 42.5 %
Platelets: 333 10*3/uL (ref 140–400)
RBC: 4.37 10*6/uL (ref 3.80–5.10)
RDW: 13.3 % (ref 11.0–15.0)
Total Lymphocyte: 51 %
WBC: 5.5 10*3/uL (ref 3.8–10.8)

## 2020-06-05 LAB — COMPREHENSIVE METABOLIC PANEL
AG Ratio: 1.3 (calc) (ref 1.0–2.5)
ALT: 45 U/L — ABNORMAL HIGH (ref 6–29)
AST: 22 U/L (ref 10–35)
Albumin: 3.8 g/dL (ref 3.6–5.1)
Alkaline phosphatase (APISO): 75 U/L (ref 37–153)
BUN: 18 mg/dL (ref 7–25)
CO2: 24 mmol/L (ref 20–32)
Calcium: 8.8 mg/dL (ref 8.6–10.4)
Chloride: 108 mmol/L (ref 98–110)
Creat: 0.77 mg/dL (ref 0.50–1.05)
Globulin: 2.9 g/dL (calc) (ref 1.9–3.7)
Glucose, Bld: 93 mg/dL (ref 65–99)
Potassium: 3.9 mmol/L (ref 3.5–5.3)
Sodium: 140 mmol/L (ref 135–146)
Total Bilirubin: 0.5 mg/dL (ref 0.2–1.2)
Total Protein: 6.7 g/dL (ref 6.1–8.1)

## 2020-06-05 LAB — VITAMIN D 25 HYDROXY (VIT D DEFICIENCY, FRACTURES): Vit D, 25-Hydroxy: 36 ng/mL (ref 30–100)

## 2020-06-05 LAB — LIPID PANEL
Cholesterol: 210 mg/dL — ABNORMAL HIGH (ref <200)
HDL: 40 mg/dL — ABNORMAL LOW (ref >=50)
LDL Cholesterol (Calc): 146 mg/dL (calc) — ABNORMAL HIGH
Non-HDL Cholesterol (Calc): 170 mg/dL (calc) — ABNORMAL HIGH (ref <130)
Total CHOL/HDL Ratio: 5.3 (calc) — ABNORMAL HIGH (ref <5.0)
Triglycerides: 118 mg/dL (ref <150)

## 2020-06-10 ENCOUNTER — Ambulatory Visit (INDEPENDENT_AMBULATORY_CARE_PROVIDER_SITE_OTHER): Payer: No Typology Code available for payment source | Admitting: Family Medicine

## 2020-06-10 ENCOUNTER — Other Ambulatory Visit: Payer: Self-pay

## 2020-06-10 ENCOUNTER — Encounter: Payer: Self-pay | Admitting: Family Medicine

## 2020-06-10 VITALS — BP 130/70 | HR 71 | Temp 97.8°F | Ht 65.0 in | Wt 228.4 lb

## 2020-06-10 DIAGNOSIS — R21 Rash and other nonspecific skin eruption: Secondary | ICD-10-CM

## 2020-06-10 DIAGNOSIS — Z0000 Encounter for general adult medical examination without abnormal findings: Secondary | ICD-10-CM

## 2020-06-10 DIAGNOSIS — Z7189 Other specified counseling: Secondary | ICD-10-CM

## 2020-06-10 DIAGNOSIS — E7800 Pure hypercholesterolemia, unspecified: Secondary | ICD-10-CM

## 2020-06-10 DIAGNOSIS — R7989 Other specified abnormal findings of blood chemistry: Secondary | ICD-10-CM

## 2020-06-10 NOTE — Patient Instructions (Addendum)
Plan on recheck labs in about 2-3 weeks.  We'll go from there.  Use the cream in the meantime and let me know if that isn't helping.  Take care.  Glad to see you.

## 2020-06-10 NOTE — Progress Notes (Signed)
This visit occurred during the SARS-CoV-2 public health emergency.  Safety protocols were in place, including screening questions prior to the visit, additional usage of staff PPE, and extensive cleaning of exam room while observing appropriate contact time as indicated for disinfecting solutions.  CPE- See plan.  Routine anticipatory guidance given to patient.  See health maintenance.  The possibility exists that previously documented standard health maintenance information may have been brought forward from a previous encounter into this note.  If needed, that same information has been updated to reflect the current situation based on today's encounter.   Tetanus- 2013 Flu 2021 PNA not due Shingles d/w pt.   covid vaccine 2021 No Pap due to hysterectomy.  Mammogram 2022. DXA not due yet.  HRT per gyn.  Living will d/w pt. Husband designated if patient were incapacitated.  cologuard neg 2020.   Diet and exercise d/w pt. 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   Lipids are better, she cut back on sweets and cut out pork.  Labs diet and exercise discussed with patient.  Rash resolved with covid, then returned in the meantime.  Hasn't been using lotrisone in the meantime, not used recently, but she had used prev and it helped.    Her sister had breast cancer.  D/w pt.    LFT elevation noted.  H/o fatty liver.  She had covid recently.  She is still fatigued.  No loss of taste and smell.  Minimal tylenol.  No etoh.    PMH and SH reviewed  Meds, vitals, and allergies reviewed.   ROS: Per HPI.  Unless specifically indicated otherwise in HPI, the patient denies:  General: fever. Eyes: acute vision changes ENT: sore throat Cardiovascular: chest pain Respiratory: SOB GI: vomiting GU: dysuria Musculoskeletal: acute back pain Derm: acute rash Neuro: acute motor dysfunction Psych: worsening mood Endocrine: polydipsia Heme: bleeding Allergy:  hayfever  GEN: nad, alert and oriented HEENT: ncat NECK: supple w/o LA CV: rrr. PULM: ctab, no inc wob ABD: soft, +bs EXT: no edema SKIN: no acute rash noted.  She has some occasional itching on the anterior upper chest but no rash there now.

## 2020-06-13 ENCOUNTER — Telehealth: Payer: Self-pay | Admitting: Family Medicine

## 2020-06-13 DIAGNOSIS — R7989 Other specified abnormal findings of blood chemistry: Secondary | ICD-10-CM | POA: Insufficient documentation

## 2020-06-13 NOTE — Assessment & Plan Note (Signed)
Improved.  See above.

## 2020-06-13 NOTE — Assessment & Plan Note (Signed)
Living will d/w pt.  Husband designated if patient were incapacitated.  

## 2020-06-13 NOTE — Assessment & Plan Note (Signed)
Not noted currently.  Reasonable to use Lotrisone in the meantime and let me know if she does not improve.  It may be that the steroid component helps with itching and the symptoms resolved.

## 2020-06-13 NOTE — Assessment & Plan Note (Addendum)
Could have been exacerbated by COVID.  History of fatty liver.  Reasonable to recheck LFTs in a few weeks.  See notes on labs.

## 2020-06-13 NOTE — Telephone Encounter (Signed)
Please update patient.  I want to check her urine when she comes in for follow-up liver tests.  I put in the order.  Please make sure she knows to give urine sample along with the blood sample when she comes in.  Thanks.

## 2020-06-13 NOTE — Assessment & Plan Note (Signed)
Tetanus- 2013 Flu 2021 PNA not due Shingles d/w pt.   covid vaccine 2021 No Pap due to hysterectomy.  Mammogram 2022. DXA not due yet.  HRT per gyn.  Living will d/w pt. Husband designated if patient were incapacitated.  cologuard neg 2020.   Diet and exercise d/w pt. 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

## 2020-06-14 NOTE — Telephone Encounter (Signed)
LMTCB; will also need lab appt in 2 weeks

## 2020-06-16 NOTE — Telephone Encounter (Signed)
LMTCB to schedule lab appt.

## 2020-06-16 NOTE — Telephone Encounter (Signed)
Patient scheduled lab appt for 07/02/20 at 10:30 am

## 2020-07-02 ENCOUNTER — Other Ambulatory Visit: Payer: Self-pay

## 2020-07-02 ENCOUNTER — Other Ambulatory Visit (INDEPENDENT_AMBULATORY_CARE_PROVIDER_SITE_OTHER): Payer: No Typology Code available for payment source

## 2020-07-02 DIAGNOSIS — R7989 Other specified abnormal findings of blood chemistry: Secondary | ICD-10-CM | POA: Diagnosis not present

## 2020-07-02 LAB — URINALYSIS, ROUTINE W REFLEX MICROSCOPIC
Bilirubin Urine: NEGATIVE
Ketones, ur: NEGATIVE
Leukocytes,Ua: NEGATIVE
Nitrite: NEGATIVE
Specific Gravity, Urine: >=1.03 — AB (ref 1.000–1.030)
Total Protein, Urine: NEGATIVE
Urine Glucose: NEGATIVE
Urobilinogen, UA: 0.2 (ref 0.0–1.0)
pH: 6 (ref 5.0–8.0)

## 2020-07-05 LAB — HEPATIC FUNCTION PANEL
ALT: 17 U/L (ref 0–35)
AST: 19 U/L (ref 0–37)
Albumin: 4 g/dL (ref 3.5–5.2)
Alkaline Phosphatase: 65 U/L (ref 39–117)
Bilirubin, Direct: 0.5 mg/dL — ABNORMAL HIGH (ref 0.0–0.3)
Total Bilirubin: 0.6 mg/dL (ref 0.2–1.2)
Total Protein: 7.3 g/dL (ref 6.0–8.3)

## 2020-07-26 ENCOUNTER — Encounter (HOSPITAL_COMMUNITY): Payer: Self-pay

## 2020-07-26 ENCOUNTER — Other Ambulatory Visit: Payer: Self-pay

## 2020-07-26 ENCOUNTER — Emergency Department (HOSPITAL_COMMUNITY)
Admission: EM | Admit: 2020-07-26 | Discharge: 2020-07-26 | Disposition: A | Discharge status: Home / Self Care | Payer: No Typology Code available for payment source | Attending: Emergency Medicine | Admitting: Emergency Medicine

## 2020-07-26 ENCOUNTER — Emergency Department (HOSPITAL_COMMUNITY): Payer: No Typology Code available for payment source

## 2020-07-26 DIAGNOSIS — E876 Hypokalemia: Secondary | ICD-10-CM | POA: Insufficient documentation

## 2020-07-26 DIAGNOSIS — R519 Headache, unspecified: Secondary | ICD-10-CM | POA: Insufficient documentation

## 2020-07-26 DIAGNOSIS — R079 Chest pain, unspecified: Secondary | ICD-10-CM | POA: Diagnosis not present

## 2020-07-26 DIAGNOSIS — R1013 Epigastric pain: Secondary | ICD-10-CM | POA: Insufficient documentation

## 2020-07-26 DIAGNOSIS — R42 Dizziness and giddiness: Secondary | ICD-10-CM | POA: Diagnosis present

## 2020-07-26 LAB — COMPREHENSIVE METABOLIC PANEL
ALT: 22 U/L (ref 0–44)
AST: 20 U/L (ref 15–41)
Albumin: 4 g/dL (ref 3.5–5.0)
Alkaline Phosphatase: 71 U/L (ref 38–126)
Anion gap: 10 (ref 5–15)
BUN: 18 mg/dL (ref 6–20)
CO2: 20 mmol/L — ABNORMAL LOW (ref 22–32)
Calcium: 8.9 mg/dL (ref 8.9–10.3)
Chloride: 108 mmol/L (ref 98–111)
Creatinine, Ser: 0.85 mg/dL (ref 0.44–1.00)
GFR, Estimated: >60 mL/min (ref >60)
Glucose, Bld: 136 mg/dL — ABNORMAL HIGH (ref 70–99)
Potassium: 3 mmol/L — ABNORMAL LOW (ref 3.5–5.1)
Sodium: 138 mmol/L (ref 135–145)
Total Bilirubin: 0.5 mg/dL (ref 0.3–1.2)
Total Protein: 7.3 g/dL (ref 6.5–8.1)

## 2020-07-26 LAB — CBC
HCT: 39.8 % (ref 36.0–46.0)
Hemoglobin: 13.1 g/dL (ref 12.0–15.0)
MCH: 30.4 pg (ref 26.0–34.0)
MCHC: 32.9 g/dL (ref 30.0–36.0)
MCV: 92.3 fL (ref 80.0–100.0)
Platelets: 372 10*3/uL (ref 150–400)
RBC: 4.31 MIL/uL (ref 3.87–5.11)
RDW: 14.2 % (ref 11.5–15.5)
WBC: 9.7 10*3/uL (ref 4.0–10.5)
nRBC: 0 % (ref 0.0–0.2)

## 2020-07-26 LAB — TROPONIN I (HIGH SENSITIVITY)
Troponin I (High Sensitivity): 2 ng/L (ref <18)
Troponin I (High Sensitivity): 3 ng/L (ref <18)

## 2020-07-26 LAB — LIPASE, BLOOD: Lipase: 30 U/L (ref 11–51)

## 2020-07-26 MED ORDER — SODIUM CHLORIDE 0.9 % IV BOLUS
1000.0000 mL | Freq: Once | INTRAVENOUS | Status: AC
  Administered 2020-07-26: 1000 mL via INTRAVENOUS

## 2020-07-26 MED ORDER — MAGNESIUM OXIDE -MG SUPPLEMENT 400 (240 MG) MG PO TABS
800.0000 mg | ORAL_TABLET | Freq: Once | ORAL | Status: AC
  Administered 2020-07-26: 800 mg via ORAL
  Filled 2020-07-26: qty 2

## 2020-07-26 MED ORDER — POTASSIUM CHLORIDE CRYS ER 20 MEQ PO TBCR
40.0000 meq | EXTENDED_RELEASE_TABLET | Freq: Once | ORAL | Status: AC
  Administered 2020-07-26: 40 meq via ORAL
  Filled 2020-07-26: qty 2

## 2020-07-26 MED ORDER — FAMOTIDINE IN NACL 20-0.9 MG/50ML-% IV SOLN
20.0000 mg | Freq: Once | INTRAVENOUS | Status: AC
  Administered 2020-07-26: 20 mg via INTRAVENOUS
  Filled 2020-07-26: qty 50

## 2020-07-26 MED ORDER — DIPHENHYDRAMINE HCL 50 MG/ML IJ SOLN
25.0000 mg | Freq: Once | INTRAMUSCULAR | Status: AC
  Administered 2020-07-26: 25 mg via INTRAVENOUS
  Filled 2020-07-26: qty 1

## 2020-07-26 MED ORDER — PROCHLORPERAZINE EDISYLATE 10 MG/2ML IJ SOLN
10.0000 mg | Freq: Once | INTRAMUSCULAR | Status: AC
  Administered 2020-07-26: 10 mg via INTRAVENOUS
  Filled 2020-07-26: qty 2

## 2020-07-26 MED ORDER — FENTANYL CITRATE (PF) 100 MCG/2ML IJ SOLN
50.0000 ug | Freq: Once | INTRAMUSCULAR | Status: AC
  Administered 2020-07-26: 50 ug via INTRAVENOUS
  Filled 2020-07-26: qty 2

## 2020-07-26 MED ORDER — ONDANSETRON HCL 4 MG/2ML IJ SOLN
4.0000 mg | Freq: Once | INTRAMUSCULAR | Status: AC
  Administered 2020-07-26: 4 mg via INTRAVENOUS
  Filled 2020-07-26: qty 2

## 2020-07-26 NOTE — Discharge Instructions (Addendum)
You were evaluated in the Emergency Department and after careful evaluation, we did not find any emergent condition requiring admission or further testing in the hospital.  Your exam/testing today is overall reassuring.  Symptoms may be related to acid reflux related discomfort.  You can try over-the-counter Prilosec daily.  You may also have some dehydration.  Your potassium levels were found to be a bit low.  Recommend increasing the amount of potassium intake in your diet.  Please discuss your symptoms with your primary care doctor.  Please return to the Emergency Department if you experience any worsening of your condition.   Thank you for allowing [place] to be a part of your care.

## 2020-07-26 NOTE — ED Provider Notes (Signed)
[facility] Emergency Department Provider Note MRN:  [id number]  Arrival date & time: 07/26/20     Chief Complaint   Dizziness   History of Present Illness   [name] is a 55 y.o. year-old female with a history of GERD, SVT presenting to the ED with chief complaint of dizziness.  Dizziness and malaise starting at about 5 AM while patient is at work.  Associated with epigastric discomfort, burning central chest pain.  Denies shortness of breath, no recent leg pain or swelling.  No recent fever or cough or illness.  Felt normal yesterday and when waking this morning.  No headache or vision change, no trouble speaking or swallowing, no neck pain, no back pain.  Nausea but no vomiting, no diarrhea.  Symptoms are moderate, constant, no exacerbating or alleviating factors.  Review of Systems  A complete 10 system review of systems was obtained and all systems are negative except as noted in the HPI and PMH.   Patient's Health History    Past Medical History:  Diagnosis Date   Anemia    Anxiety state, unspecified    GERD (gastroesophageal reflux disease)    Headache(784.0)    Other constipation    SVT (supraventricular tachycardia) ([place])     Past Surgical History:  Procedure Laterality Date   CARDIAC CATHETERIZATION  12/09/03   Normal   CERVICAL DISC SURGERY     Ruptured disc repair / and approach c5/6, c6/7; plate   HERNIA REPAIR     umbilical   LAPAROSCOPIC ASSISTED VAGINAL HYSTERECTOMY N/A 03/09/2015   Procedure: LAPAROSCOPIC ASSISTED VAGINAL HYSTERECTOMY;  Surgeon: Jonnie Kind, MD;  Location: [facility];  Service: Gynecology;  Laterality: N/A;   OPEN REDUCTION INTERNAL FIXATION (ORIF) DISTAL RADIAL FRACTURE Right 03/01/2018   Procedure: OPEN REDUCTION INTERNAL FIXATION (ORIF) DISTAL RADIAL FRACTURE;  Surgeon: Iran Planas, MD;  Location: [facility];  Service: Orthopedics;  Laterality: Right;   RECTOCELE REPAIR N/A 03/09/2015   Procedure: POSTERIOR REPAIR  (RECTOCELE);  Surgeon: Jonnie Kind, MD;  Location: [facility];  Service: Gynecology;  Laterality: N/A;   SALPINGOOPHORECTOMY Bilateral 03/09/2015   Procedure: SALPINGO OOPHORECTOMY;  Surgeon: Jonnie Kind, MD;  Location: [facility];  Service: Gynecology;  Laterality: Bilateral;   TUBAL LIGATION     VAGINAL DELIVERY  90, 97, 99, 03   x 4     Family History  Problem Relation Age of Onset   Deep vein thrombosis Mother        cerebral blood clot    Heart attack Mother    Heart failure Father    Diabetes Maternal Grandmother    Stroke Maternal Grandmother    Cancer Maternal Grandfather        Prostate    Depression Maternal Uncle    Colon cancer Maternal Uncle    Breast cancer Sister    Hypertension Neg Hx    Arthritis Neg Hx     Social History   Socioeconomic History   Marital status: Married    Spouse name: [name]   Number of children: 4   Years of education: 14   Highest education level: Not on file  Occupational History   Occupation: [employer]     Employer: [employer]     Employer: [employer]  Tobacco Use   Smoking status: Never   Smokeless tobacco: Never  Vaping Use   Vaping Use: Never used  Substance and  Sexual Activity   Alcohol use: No    Alcohol/week: 0.0 standard drinks   Drug use: No   Sexual activity: Yes    Birth control/protection: Surgical  Other Topics Concern   Not on file  Social History Narrative   Patient lives with her husband [name] ) and her children.   Married 1995   Patient has four children.   Patient works full-time.   Patient has a college education.   Patient is right-handed.   Patient drinks caffeine- once or twice a week.     Social Determinants of Health   Financial Resource Strain: Low Risk    Difficulty of Paying Living Expenses: Not hard at all  Food Insecurity: No Food Insecurity   Worried About Charity fundraiser in the Last Year: Never true   [place] in the Last  Year: Never true  Transportation Needs: No Transportation Needs   Lack of Transportation (Medical): No   Lack of Transportation (Non-Medical): No  Physical Activity: Insufficiently Active   Days of Exercise per Week: 2 days   Minutes of Exercise per Session: 20 min  Stress: Stress Concern Present   Feeling of Stress : To some extent  Social Connections: Moderately Integrated   Frequency of Communication with Friends and Family: Once a week   Frequency of Social Gatherings with Friends and Family: Once a week   Attends Religious Services: More than 4 times per year   Active Member of [employer] or Organizations: Yes   Attends Music therapist: More than 4 times per year   Marital Status: Married  Human resources officer Violence: Not At Risk   Fear of Current or Ex-Partner: No   Emotionally Abused: No   Physically Abused: No   Sexually Abused: No     Physical Exam   Vitals:   07/26/20 0711 07/26/20 0800  BP: (!) 151/69 129/69  Pulse: 100 64  Resp: (!) 23 18  Temp: 98 F (36.7 C)   SpO2: 100% 100%    CONSTITUTIONAL: Well-appearing, NAD NEURO:  Alert and oriented x 3, no focal deficits EYES:  eyes equal and reactive ENT/NECK:  no LAD, no JVD CARDIO: Regular rate, well-perfused, normal S1 and S2 PULM:  CTAB no wheezing or rhonchi GI/GU:  normal bowel sounds, non-distended, non-tender MSK/SPINE:  No gross deformities, no edema SKIN:  no rash, atraumatic PSYCH:  Appropriate speech and behavior  *Additional and/or pertinent findings included in MDM below  Diagnostic and Interventional Summary    EKG Interpretation  Date/Time:  Monday July 26 2020 07:05:40 EDT Ventricular Rate:  100 PR Interval:  138 QRS Duration: 168 QT Interval:  413 QTC Calculation: 533 R Axis:   -49 Text Interpretation: Sinus tachycardia RBBB and LAFB Baseline wander in lead(s) V6 No significant change was found Confirmed by Gerlene Fee [phone]) on 07/26/2020 7:08:29 AM        Labs  Reviewed  COMPREHENSIVE METABOLIC PANEL - Abnormal; Notable for the following components:      Result Value   Potassium 3.0 (*)    CO2 20 (*)    Glucose, Bld 136 (*)    All other components within normal limits  CBC  LIPASE, BLOOD  TROPONIN I (HIGH SENSITIVITY)  TROPONIN I (HIGH SENSITIVITY)    DG Chest Port 1 View  Final Result      Medications  potassium chloride SA (KLOR-CON) CR tablet 40 mEq (has no administration in time range)  magnesium oxide (MAG-OX) tablet 800  mg (has no administration in time range)  sodium chloride 0.9 % bolus 1,000 mL (1,000 mLs Intravenous New Bag/Given 07/26/20 0729)  famotidine (PEPCID) IVPB 20 mg premix (0 mg Intravenous Stopped 07/26/20 0801)  ondansetron (ZOFRAN) injection 4 mg (4 mg Intravenous Given 07/26/20 0730)  fentaNYL (SUBLIMAZE) injection 50 mcg (50 mcg Intravenous Given 07/26/20 0730)  diphenhydrAMINE (BENADRYL) injection 25 mg (25 mg Intravenous Given 07/26/20 0811)  prochlorperazine (COMPAZINE) injection 10 mg (10 mg Intravenous Given 07/26/20 1610)     Procedures  /  Critical Care Procedures  ED Course and Medical Decision Making  I have reviewed the triage vital signs, the nursing notes, and pertinent available records from the EMR.  Listed above are laboratory and imaging tests that I personally ordered, reviewed, and interpreted and then considered in my medical decision making (see below for details).  Considering dehydration, orthostatic hypotension, gastritis, arrhythmia, ACS.  Doubt PE or dissection.  EKG is largely unchanged, right bundle branch block noted, sinus rhythm, no obvious ischemic changes.  Providing fluids, symptomatic management and will reassess.  Abdomen is currently soft, nontender, no rebound guarding or rigidity.     Troponin is negative x2, patient feeling much better after medications listed above.  Suspect GERD related symptoms possibly with some dehydration.  Appropriate for discharge with strict return  precautions and PCP follow-up.  Barth Kirks. Sedonia Small, [place] [email or web address]  Final Clinical Impressions(s) / ED Diagnoses     ICD-10-CM   1. Chest pain, unspecified type  R07.9     2. Epigastric pain  R10.13     3. Acute nonintractable headache, unspecified headache type  R51.9     4. Dizziness  R42     5. Hypokalemia  E87.6       ED Discharge Orders     None        Discharge Instructions Discussed with and Provided to Patient:     Discharge Instructions      You were evaluated in the Emergency Department and after careful evaluation, we did not find any emergent condition requiring admission or further testing in the hospital.  Your exam/testing today is overall reassuring.  Symptoms may be related to acid reflux related discomfort.  You can try over-the-counter Prilosec daily.  You may also have some dehydration.  Your potassium levels were found to be a bit low.  Recommend increasing the amount of potassium intake in your diet.  Please discuss your symptoms with your primary care doctor.  Please return to the Emergency Department if you experience any worsening of your condition.   Thank you for allowing [place] to be a part of your care.        Maudie Flakes, MD 07/26/20 [phone]

## 2020-07-26 NOTE — ED Triage Notes (Signed)
Pt presents to ED with complaints of nausea, SOB and dizziness since 0530. Pt states she was working when it started. Pt states she feels lightheaded when she stands.

## 2020-10-11 ENCOUNTER — Other Ambulatory Visit: Payer: Self-pay | Admitting: Adult Health

## 2020-11-26 ENCOUNTER — Other Ambulatory Visit: Payer: Self-pay

## 2020-11-26 MED ORDER — FLUARIX QUADRIVALENT 0.5 ML IM SUSY
PREFILLED_SYRINGE | INTRAMUSCULAR | 0 refills | Status: DC
  Filled 2020-11-26: qty 0.5, 1d supply, fill #0

## 2021-01-07 ENCOUNTER — Ambulatory Visit: Payer: No Typology Code available for payment source | Admitting: Family Medicine

## 2021-01-20 ENCOUNTER — Encounter: Payer: Self-pay | Admitting: Adult Health

## 2021-01-20 ENCOUNTER — Other Ambulatory Visit: Payer: Self-pay

## 2021-01-20 ENCOUNTER — Ambulatory Visit (INDEPENDENT_AMBULATORY_CARE_PROVIDER_SITE_OTHER): Payer: No Typology Code available for payment source | Admitting: Adult Health

## 2021-01-20 VITALS — BP 126/77 | HR 66 | Ht 65.0 in | Wt 228.0 lb

## 2021-01-20 DIAGNOSIS — Z90710 Acquired absence of both cervix and uterus: Secondary | ICD-10-CM

## 2021-01-20 DIAGNOSIS — N3946 Mixed incontinence: Secondary | ICD-10-CM

## 2021-01-20 DIAGNOSIS — Z79899 Other long term (current) drug therapy: Secondary | ICD-10-CM | POA: Diagnosis not present

## 2021-01-20 DIAGNOSIS — Z1211 Encounter for screening for malignant neoplasm of colon: Secondary | ICD-10-CM

## 2021-01-20 DIAGNOSIS — Z01419 Encounter for gynecological examination (general) (routine) without abnormal findings: Secondary | ICD-10-CM

## 2021-01-20 DIAGNOSIS — Z90721 Acquired absence of ovaries, unilateral: Secondary | ICD-10-CM

## 2021-01-20 DIAGNOSIS — Z79818 Long term (current) use of other agents affecting estrogen receptors and estrogen levels: Secondary | ICD-10-CM | POA: Insufficient documentation

## 2021-01-20 HISTORY — DX: Acquired absence of both cervix and uterus: Z90.710

## 2021-01-20 LAB — HEMOCCULT GUIAC POC 1CARD (OFFICE): Fecal Occult Blood, POC: NEGATIVE

## 2021-01-20 MED ORDER — OXYBUTYNIN CHLORIDE ER 10 MG PO TB24: 10.0000 mg | ORAL_TABLET | Freq: Every day | ORAL | 3 refills | Status: DC

## 2021-01-20 MED ORDER — ESTRADIOL 0.1 MG/24HR TD PTWK: 0.1000 mg | MEDICATED_PATCH | TRANSDERMAL | 2 refills | Status: DC

## 2021-01-20 NOTE — Progress Notes (Signed)
Patient ID: [name], female   DOB: [birth year], 55 y.o.   MRN: [id number] History of Present Illness: [name] is a 55 year old black female,married, sp hysterectomy, in for pelvic exam, and breast exam, had physical with PCP 06/10/20. Had rash on buttocks but gone now.  PCP is Dr Damita Dunnings.   Current Medications, Allergies, Past Medical History, Past Surgical History, Family History and Social History were reviewed in [employer] record.     Review of Systems: Patient denies any headaches, hearing loss, fatigue, blurred vision, shortness of breath, chest pain, abdominal pain, problems with bowel movements, or intercourse. No joint pain or mood swings.  Has urinary incontinence Sweats if not on ET patch   Physical Exam:BP 126/77 (BP Location: Right Arm, Patient Position: Sitting, Cuff Size: Large)    Pulse 66    Ht 5\' 5"  (1.651 m)    Wt 228 lb (103.4 kg)    LMP 01/31/2015    BMI 37.94 kg/m   General:  Well developed, well nourished, no acute distress Skin:  Warm and dry Breast:  No dominant palpable mass, retraction, or nipple discharge Abdomen:  Soft, non tender, no hepatosplenomegaly Pelvic:  External genitalia is normal in appearance, no lesions.  The vagina is normal in appearance. Urethra has no lesions or masses. The cervix and uterus are absent.No adnexal masses or tenderness noted.Bladder is non tender, no masses felt. Rectal: Good sphincter tone, no polyps, or hemorrhoids felt.  Hemoccult negative. Extremities/musculoskeletal:  No swelling or varicosities noted, no clubbing or cyanosis Psych:  No mood changes, alert and cooperative,seems happy AA is 0  Fall risk is low Depression screen [facility] 2/9 01/20/2021 10/23/2019 09/25/2019  Decreased Interest 1 0 0  Down, Depressed, Hopeless 0 0 0  PHQ - 2 Score 1 0 0  Altered sleeping 1 1 3   Tired, decreased energy 1 1 0  Change in appetite 1 1 0  Feeling bad or failure about yourself  0 0 0  Trouble concentrating 0  1 0  Moving slowly or fidgety/restless 1 0 0  Suicidal thoughts 0 0 0  PHQ-9 Score 5 4 3   Difficult doing work/chores - Not difficult at all Not difficult at all    GAD 7 : Generalized Anxiety Score 01/20/2021 10/23/2019 09/25/2019  Nervous, Anxious, on Edge 1 1 0  Control/stop worrying 1 1 0  Worry too much - different things 1 1 0  Trouble relaxing 1 1 0  Restless 0 0 0  Easily annoyed or irritable 1 1 0  Afraid - awful might happen 0 0 0  Total GAD 7 Score 5 5 0  Anxiety Difficulty - Not difficult at all Not difficult at all    Upstream - 01/20/21 1520       Pregnancy Intention Screening   Does the patient want to become pregnant in the next year? N/A    Does the patient's partner want to become pregnant in the next year? N/A    Would the patient like to discuss contraceptive options today? N/A      Contraception Wrap Up   Current Method Female Sterilization   hyst   End Method Female Sterilization   hyst   Contraception Counseling Provided No              Examination chaperoned by Levy Pupa LPN  Impression and Plan: 1. Visit for pelvic exam Physical in 1 year   2. Encounter for screening fecal occult blood testing -  POCT occult blood stool  3. S/P hysterectomy with oophorectomy   4. Current use of estrogen therapy  5. Mixed Urinary incontinence . Meds ordered this encounter  Medications   estradiol (CLIMARA - DOSED IN MG/24 HR) 0.1 mg/24hr patch    Sig: Place 1 patch (0.1 mg total) onto the skin once a week.    Dispense:  12 patch    Refill:  2    Order Specific Question:   Supervising Provider    Answer:   Tania Ade H [[id number]]   oxybutynin (DITROPAN XL) 10 MG 24 hr tablet    Sig: Take 1 tablet (10 mg total) by mouth daily.    Dispense:  30 tablet    Refill:  3    Order Specific Question:   Supervising Provider    Answer:   Florian Buff [[id number]]   Call me in follow up, if not better will refer to urology

## 2021-06-16 ENCOUNTER — Ambulatory Visit (INDEPENDENT_AMBULATORY_CARE_PROVIDER_SITE_OTHER): Payer: No Typology Code available for payment source | Admitting: Family

## 2021-06-16 ENCOUNTER — Encounter: Payer: Self-pay | Admitting: Family

## 2021-06-16 VITALS — BP 122/72 | HR 72 | Temp 98.3°F | Resp 16 | Ht 65.0 in | Wt 220.3 lb

## 2021-06-16 DIAGNOSIS — K219 Gastro-esophageal reflux disease without esophagitis: Secondary | ICD-10-CM | POA: Diagnosis not present

## 2021-06-16 DIAGNOSIS — J0110 Acute frontal sinusitis, unspecified: Secondary | ICD-10-CM | POA: Diagnosis not present

## 2021-06-16 MED ORDER — OMEPRAZOLE 20 MG PO CPDR: 20.0000 mg | DELAYED_RELEASE_CAPSULE | Freq: Every day | ORAL | 3 refills | Status: DC

## 2021-06-16 MED ORDER — AMOXICILLIN-POT CLAVULANATE 875-125 MG PO TABS: 1.0000 | ORAL_TABLET | Freq: Two times a day (BID) | ORAL | 0 refills | Status: DC

## 2021-06-16 NOTE — Assessment & Plan Note (Signed)
Prescription given for augmentin 875/125 mg po bid for ten days. Pt to continue tylenol/ibuprofen prn sinus pain. Continue with humidifier prn and steam showers recommended as well. instructed If no symptom improvement in 48 hours please f/u Recommend daily flonase 50 mcg Continue daily zyrtec 10 mg

## 2021-06-16 NOTE — Progress Notes (Signed)
Established Patient Office Visit  Subjective:  Patient ID: [name], female    DOB: 1965/05/14  Age: 56 y.o. MRN: [id number]  CC:  Chief Complaint  Patient presents with   Sinus Problem   Headache    X 2 weeks comes and goes. Have not took a covid test.    HPI [name] is here today with concerns.   Headache and sinus pressure, sinus headache for the last two weeks.  No sore throat. No ear pain.  Does have heart burn as well.  Slight chest congestion.   No fever.   Past Medical History:  Diagnosis Date   Anemia    Anxiety state, unspecified    GERD (gastroesophageal reflux disease)    Headache(784.0)    Other constipation    S/P hysterectomy with oophorectomy 01/20/2021   SVT (supraventricular tachycardia) [facility])     Past Surgical History:  Procedure Laterality Date   CARDIAC CATHETERIZATION  12/09/03   Normal   CERVICAL DISC SURGERY     Ruptured disc repair / and approach c5/6, c6/7; plate   HERNIA REPAIR     umbilical   LAPAROSCOPIC ASSISTED VAGINAL HYSTERECTOMY N/A 03/09/2015   Procedure: LAPAROSCOPIC ASSISTED VAGINAL HYSTERECTOMY;  Surgeon: Jonnie Kind, MD;  Location: [facility];  Service: Gynecology;  Laterality: N/A;   OPEN REDUCTION INTERNAL FIXATION (ORIF) DISTAL RADIAL FRACTURE Right 03/01/2018   Procedure: OPEN REDUCTION INTERNAL FIXATION (ORIF) DISTAL RADIAL FRACTURE;  Surgeon: Iran Planas, MD;  Location: [place] [facility];  Service: Orthopedics;  Laterality: Right;   RECTOCELE REPAIR N/A 03/09/2015   Procedure: POSTERIOR REPAIR (RECTOCELE);  Surgeon: Jonnie Kind, MD;  Location: [facility];  Service: Gynecology;  Laterality: N/A;   SALPINGOOPHORECTOMY Bilateral 03/09/2015   Procedure: SALPINGO OOPHORECTOMY;  Surgeon: Jonnie Kind, MD;  Location: [facility];  Service: Gynecology;  Laterality: Bilateral;   TUBAL LIGATION     VAGINAL DELIVERY  90, 97, 99, 03   x 4     Family History  Problem Relation Age of Onset   Deep vein thrombosis Mother         cerebral blood clot    Heart attack Mother    Heart failure Father    Diabetes Maternal Grandmother    Stroke Maternal Grandmother    Cancer Maternal Grandfather        Prostate    Depression Maternal Uncle    Colon cancer Maternal Uncle    Breast cancer Sister    Hypertension Neg Hx    Arthritis Neg Hx     Social History   Socioeconomic History   Marital status: Married    Spouse name: [name]   Number of children: 4   Years of education: 14   Highest education level: Not on file  Occupational History   Occupation: [employer]     Employer: [employer]     Employer: [employer]  Tobacco Use   Smoking status: Never   Smokeless tobacco: Never  Vaping Use   Vaping Use: Never used  Substance and Sexual Activity   Alcohol use: No    Alcohol/week: 0.0 standard drinks   Drug use: No   Sexual activity: Yes    Birth control/protection: Surgical    Comment: tubal/hyst  Other Topics Concern   Not on file  Social History Narrative   Patient lives with her husband [name] ) and her children.   Married 1995  Patient has four children.   Patient works full-time.   Patient has a college education.   Patient is right-handed.   Patient drinks caffeine- once or twice a week.     Social Determinants of Health   Financial Resource Strain: Not on file  Food Insecurity: Not on file  Transportation Needs: Not on file  Physical Activity: Not on file  Stress: Not on file  Social Connections: Not on file  Intimate Partner Violence: Not on file    Outpatient Medications Prior to Visit  Medication Sig Dispense Refill   cetirizine (ZYRTEC) 10 MG tablet Take 10 mg by mouth daily.     Cholecalciferol (VITAMIN D3) 50 MCG (2000 UT) TABS Take 2,000 Units by mouth.     estradiol (CLIMARA - DOSED IN MG/24 HR) 0.1 mg/24hr patch Place 1 patch (0.1 mg total) onto the skin once a week. 12 patch 2   Multiple Vitamins-Minerals (MULTI-VITAMIN  GUMMIES) CHEW Chew 2 tablets by mouth daily.      oxybutynin (DITROPAN XL) 10 MG 24 hr tablet Take 1 tablet (10 mg total) by mouth daily. (Patient not taking: Reported on 06/16/2021) 30 tablet 3   No facility-administered medications prior to visit.    Allergies  Allergen Reactions   Requip [Ropinirole Hcl]     Intolerant; makes her sleepy   Sulfonamide Derivatives Rash    ROS Review of Systems  Constitutional:  Negative for chills, fatigue and fever.  HENT:  Positive for postnasal drip, sinus pressure and sinus pain. Negative for congestion, ear pain, rhinorrhea, sneezing and sore throat.   Eyes:  Negative for discharge.  Respiratory:  Positive for chest tightness. Negative for cough, shortness of breath and wheezing.   Cardiovascular:  Negative for chest pain and palpitations.     Objective:    Physical Exam Constitutional:      General: She is not in acute distress.    Appearance: Normal appearance. She is well-developed. She is obese. She is not ill-appearing.  HENT:     Right Ear: Tympanic membrane normal.     Left Ear: Tympanic membrane normal.     Nose: No congestion or rhinorrhea.     Right Turbinates: Enlarged and swollen.     Left Turbinates: Enlarged and swollen.     Right Sinus: Maxillary sinus tenderness and frontal sinus tenderness present.     Left Sinus: Maxillary sinus tenderness and frontal sinus tenderness present.     Mouth/Throat:     Mouth: Mucous membranes are moist.     Pharynx: Posterior oropharyngeal erythema present. No pharyngeal swelling or oropharyngeal exudate.     Tonsils: No tonsillar exudate.  Eyes:     Extraocular Movements: Extraocular movements intact.     Conjunctiva/sclera: Conjunctivae normal.     Pupils: Pupils are equal, round, and reactive to light.  Neck:     Thyroid: No thyroid mass.  Cardiovascular:     Rate and Rhythm: Normal rate and regular rhythm.  Pulmonary:     Effort: Pulmonary effort is normal.     Breath sounds:  Normal breath sounds.  Musculoskeletal:     Cervical back: Normal range of motion.  Lymphadenopathy:     Cervical:     Right cervical: No superficial cervical adenopathy.    Left cervical: No superficial cervical adenopathy.  Neurological:     Mental Status: She is alert.    BP 122/72   Pulse 72   Temp 98.3 F (36.8 C)   Resp 16  Ht '5\' 5"'$  (1.651 m)   Wt 220 lb 5 oz (99.9 kg)   LMP 01/31/2015   SpO2 98%   BMI 36.66 kg/m  Wt Readings from Last 3 Encounters:  06/16/21 220 lb 5 oz (99.9 kg)  01/20/21 228 lb (103.4 kg)  07/26/20 228 lb (103.4 kg)     Health Maintenance Due  Topic Date Due   Hepatitis C Screening  Never done   Zoster Vaccines- Shingrix (1 of 2) Never done   COVID-19 Vaccine (4 - Booster for Pfizer series) 12/23/2019   TETANUS/TDAP  02/02/2021    There are no preventive care reminders to display for this patient.  Lab Results  Component Value Date   TSH 1.02 12/24/2018   Lab Results  Component Value Date   WBC 9.7 07/26/2020   HGB 13.1 07/26/2020   HCT 39.8 07/26/2020   MCV 92.3 07/26/2020   PLT 372 07/26/2020   Lab Results  Component Value Date   NA 138 07/26/2020   K 3.0 (L) 07/26/2020   CO2 20 (L) 07/26/2020   GLUCOSE 136 (H) 07/26/2020   BUN 18 07/26/2020   CREATININE 0.85 07/26/2020   BILITOT 0.5 07/26/2020   ALKPHOS 71 07/26/2020   AST 20 07/26/2020   ALT 22 07/26/2020   PROT 7.3 07/26/2020   ALBUMIN 4.0 07/26/2020   CALCIUM 8.9 07/26/2020   ANIONGAP 10 07/26/2020   GFR 90.22 06/12/2019   Lab Results  Component Value Date   HGBA1C 5.5 05/14/2014      Assessment & Plan:   Problem List Items Addressed This Visit       Respiratory   Acute non-recurrent frontal sinusitis    Prescription given for augmentin 875/125 mg po bid for ten days. Pt to continue tylenol/ibuprofen prn sinus pain. Continue with humidifier prn and steam showers recommended as well. instructed If no symptom improvement in 48 hours please f/u Recommend  daily flonase 50 mcg Continue daily zyrtec 10 mg       Relevant Medications   cetirizine (ZYRTEC) 10 MG tablet   amoxicillin-clavulanate (AUGMENTIN) 875-125 MG tablet     Digestive   GERD (gastroesophageal reflux disease) - Primary    Try to decrease and or avoid spicy foods, fried fatty foods, and also caffeine and chocolate as these can increase heartburn symptoms.  Sent in trial omeprazole 20 mg once daily x 30 days       Relevant Medications   omeprazole (PRILOSEC) 20 MG capsule    Meds ordered this encounter  Medications   omeprazole (PRILOSEC) 20 MG capsule    Sig: Take 1 capsule (20 mg total) by mouth daily.    Dispense:  30 capsule    Refill:  3    Order Specific Question:   Supervising Provider    Answer:   BEDSOLE, AMY E [[id number]]   amoxicillin-clavulanate (AUGMENTIN) 875-125 MG tablet    Sig: Take 1 tablet by mouth 2 (two) times daily.    Dispense:  20 tablet    Refill:  0    Order Specific Question:   Supervising Provider    Answer:   BEDSOLE, AMY E [[id number]]    Follow-up: Return if symptoms worsen or fail to improve with pcp.    Eugenia Pancoast, FNP

## 2021-06-16 NOTE — Assessment & Plan Note (Signed)
Try to decrease and or avoid spicy foods, fried fatty foods, and also caffeine and chocolate as these can increase heartburn symptoms.  Sent in trial omeprazole 20 mg once daily x 30 days

## 2021-06-16 NOTE — Patient Instructions (Signed)
Sent in omeprazole for your heart burn.  Please start trial of omeprazole as prescribed. Take this medication for two weeks, administer 30 minutes prior to breakfast each am. Try to decrease and or avoid spicy foods, fried fatty foods, and also caffeine and chocolate as these can increase heartburn symptoms.   Recommend flonase daily to start and continue zyrtec.   Take antibiotic as prescribed. Increase oral fluids. Pt to f/u if sx worsen and or fail to improve in 2-3 days.  Due to recent changes in healthcare laws, you may see results of your imaging and/or laboratory studies on MyChart before I have had a chance to review them.  I understand that in some cases there may be results that are confusing or concerning to you. Please understand that not all results are received at the same time and often I may need to interpret multiple results in order to provide you with the best plan of care or course of treatment. Therefore, I ask that you please give me 2 business days to thoroughly review all your results before contacting my office for clarification. Should we see a critical lab result, you will be contacted sooner.   It was a pleasure seeing you today! Please do not hesitate to reach out with any questions and or concerns.  Regards,   Eugenia Pancoast FNP-C

## 2021-06-23 ENCOUNTER — Encounter: Payer: Self-pay | Admitting: Family Medicine

## 2021-06-23 ENCOUNTER — Ambulatory Visit (INDEPENDENT_AMBULATORY_CARE_PROVIDER_SITE_OTHER)
Admission: RE | Admit: 2021-06-23 | Discharge: 2021-06-23 | Disposition: A | Discharge status: Home / Self Care | Payer: No Typology Code available for payment source | Source: Ambulatory Visit | Attending: Family Medicine | Admitting: Family Medicine

## 2021-06-23 ENCOUNTER — Ambulatory Visit (INDEPENDENT_AMBULATORY_CARE_PROVIDER_SITE_OTHER): Payer: No Typology Code available for payment source | Admitting: Family Medicine

## 2021-06-23 VITALS — BP 118/64 | HR 72 | Temp 97.3°F | Ht 65.0 in | Wt 216.0 lb

## 2021-06-23 DIAGNOSIS — M545 Low back pain, unspecified: Secondary | ICD-10-CM | POA: Diagnosis not present

## 2021-06-23 DIAGNOSIS — Z7189 Other specified counseling: Secondary | ICD-10-CM

## 2021-06-23 DIAGNOSIS — M25551 Pain in right hip: Secondary | ICD-10-CM | POA: Diagnosis not present

## 2021-06-23 DIAGNOSIS — R42 Dizziness and giddiness: Secondary | ICD-10-CM | POA: Diagnosis not present

## 2021-06-23 DIAGNOSIS — Z23 Encounter for immunization: Secondary | ICD-10-CM

## 2021-06-23 DIAGNOSIS — E7800 Pure hypercholesterolemia, unspecified: Secondary | ICD-10-CM | POA: Diagnosis not present

## 2021-06-23 DIAGNOSIS — E559 Vitamin D deficiency, unspecified: Secondary | ICD-10-CM

## 2021-06-23 DIAGNOSIS — R202 Paresthesia of skin: Secondary | ICD-10-CM

## 2021-06-23 DIAGNOSIS — Z0000 Encounter for general adult medical examination without abnormal findings: Secondary | ICD-10-CM | POA: Diagnosis not present

## 2021-06-23 NOTE — Progress Notes (Addendum)
CPE- See plan.  Routine anticipatory guidance given to patient.  See health maintenance.  The possibility exists that previously documented standard health maintenance information may have been brought forward from a previous encounter into this note.  If needed, that same information has been updated to reflect the current situation based on today's encounter.    Tetanus- 2023 Flu 2022 PNA not due Shingles d/w pt.   covid vaccine 2021 No Pap due to hysterectomy.   Mammogram 2022, d/w pt about getting follow up.   DXA not due yet.   HRT per gyn.   Living will d/w pt.  Husband designated if patient were incapacitated.   cologuard neg 2020.   Diet and exercise d/w pt.  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   She is not currently on oxybutynin.  It was rx'd per gyn clinic prev.    Recent sinus sx improved in the meantime.    Her sister had breast cancer.  her sister, sister in law and mother all died in the last year.  Condolences offered.    H/o low vit D, recheck pending.  See notes on labs.   She had episode of lightheadedness that improved with taking a snack.  Recheck glucose pending.  Cardiology referral placed to see Dr. Caryl Comes.  She had chest tightness she attributed to GERD with some improvement with PPI.  She does not have exertional symptoms.  She has R hip pain and leg pain.  She still has L arm and leg numbness.  Aching R hip area most of the time.    Prev L spine MRI with: 1. Focally advanced facet arthropathy on the right at L4-5 with joint effusion and posterior synovial cyst. 2. Progressive L5-S1 degenerative disc disease since 2009. An associated left paracentral disc protrusion remains stable and small. 3. No nerve impingement.  Prev C spine MRI with: Status post C5-C7 fusion without residual impingement at those levels. Disc Pathology at C3-4 and C4-5 as well as C7-T1 could represent components of adjacent segment disease. There is  however no significant spinal stenosis or cord compression.  Gabapentin prev helped RLS and leg pain.  She is off med currently.    PMH and SH reviewed  Meds, vitals, and allergies reviewed.   ROS: Per HPI.  Unless specifically indicated otherwise in HPI, the patient denies:  General: fever. Eyes: acute vision changes ENT: sore throat Cardiovascular: chest pain Respiratory: SOB GI: vomiting GU: dysuria Musculoskeletal: acute back pain Derm: acute rash Neuro: acute motor dysfunction Psych: worsening mood Endocrine: polydipsia Heme: bleeding Allergy: hayfever  GEN: nad, alert and oriented HEENT: ncat NECK: supple w/o LA CV: rrr. PULM: ctab, no inc wob ABD: soft, +bs EXT: no edema SKIN: no acute rash She has normal dorsalis pedis pulses bilaterally.

## 2021-06-23 NOTE — Patient Instructions (Addendum)
Tetanus shot today.  Check with your insurance to see if they will cover the shingles shot. Go to the lab on the way out.   If you have mychart we'll likely use that to update you.    Let me see about options with gabapentin.   Take care.  Glad to see you.

## 2021-06-24 LAB — CBC WITH DIFFERENTIAL/PLATELET
Basophils Absolute: 0 10*3/uL (ref 0.0–0.1)
Basophils Relative: 0.2 % (ref 0.0–3.0)
Eosinophils Absolute: 0.1 10*3/uL (ref 0.0–0.7)
Eosinophils Relative: 0.8 % (ref 0.0–5.0)
HCT: 40 % (ref 36.0–46.0)
Hemoglobin: 13.1 g/dL (ref 12.0–15.0)
Lymphocytes Relative: 42 % (ref 12.0–46.0)
Lymphs Abs: 3.1 10*3/uL (ref 0.7–4.0)
MCHC: 32.8 g/dL (ref 30.0–36.0)
MCV: 91.7 fl (ref 78.0–100.0)
Monocytes Absolute: 0.4 10*3/uL (ref 0.1–1.0)
Monocytes Relative: 5.2 % (ref 3.0–12.0)
Neutro Abs: 3.8 10*3/uL (ref 1.4–7.7)
Neutrophils Relative %: 51.8 % (ref 43.0–77.0)
Platelets: 355 10*3/uL (ref 150.0–400.0)
RBC: 4.36 Mil/uL (ref 3.87–5.11)
RDW: 14.1 % (ref 11.5–15.5)
WBC: 7.3 10*3/uL (ref 4.0–10.5)

## 2021-06-24 LAB — COMPREHENSIVE METABOLIC PANEL
ALT: 16 U/L (ref 0–35)
AST: 13 U/L (ref 0–37)
Albumin: 4 g/dL (ref 3.5–5.2)
Alkaline Phosphatase: 74 U/L (ref 39–117)
BUN: 12 mg/dL (ref 6–23)
CO2: 29 mEq/L (ref 19–32)
Calcium: 9.3 mg/dL (ref 8.4–10.5)
Chloride: 104 mEq/L (ref 96–112)
Creatinine, Ser: 0.8 mg/dL (ref 0.40–1.20)
GFR: 82.21 mL/min (ref >60.00)
Glucose, Bld: 96 mg/dL (ref 70–99)
Potassium: 4 mEq/L (ref 3.5–5.1)
Sodium: 140 mEq/L (ref 135–145)
Total Bilirubin: 0.7 mg/dL (ref 0.2–1.2)
Total Protein: 6.7 g/dL (ref 6.0–8.3)

## 2021-06-24 LAB — LIPID PANEL
Cholesterol: 227 mg/dL — ABNORMAL HIGH (ref 0–200)
HDL: 42.1 mg/dL (ref >39.00)
LDL Cholesterol: 165 mg/dL — ABNORMAL HIGH (ref 0–99)
NonHDL: 185.3
Total CHOL/HDL Ratio: 5
Triglycerides: 104 mg/dL (ref 0.0–149.0)
VLDL: 20.8 mg/dL (ref 0.0–40.0)

## 2021-06-24 LAB — VITAMIN D 25 HYDROXY (VIT D DEFICIENCY, FRACTURES): VITD: 28.61 ng/mL — ABNORMAL LOW (ref 30.00–100.00)

## 2021-06-24 LAB — TSH: TSH: 1.54 u[IU]/mL (ref 0.35–5.50)

## 2021-06-27 ENCOUNTER — Other Ambulatory Visit: Payer: Self-pay | Admitting: Family Medicine

## 2021-06-27 DIAGNOSIS — E559 Vitamin D deficiency, unspecified: Secondary | ICD-10-CM | POA: Insufficient documentation

## 2021-06-27 DIAGNOSIS — Z7189 Other specified counseling: Secondary | ICD-10-CM | POA: Insufficient documentation

## 2021-06-27 MED ORDER — GABAPENTIN 100 MG PO CAPS: 100.0000 mg | ORAL_CAPSULE | Freq: Three times a day (TID) | ORAL | 3 refills | Status: DC | PRN

## 2021-06-27 MED ORDER — VITAMIN D3 50 MCG (2000 UT) PO TABS: 4000.0000 [IU] | ORAL_TABLET | Freq: Every day | ORAL | Status: AC

## 2021-06-27 NOTE — Assessment & Plan Note (Addendum)
See notes on labs.  Prescription sent to restart gabapentin.  Records reviewed regarding previous C-spine and L-spine imaging along with MRI brain.  Given her hip and leg symptoms that it is reasonable to recheck plain films.  See notes on plain films.

## 2021-06-27 NOTE — Assessment & Plan Note (Signed)
See notes on labs and refer back to cardiology.

## 2021-06-27 NOTE — Assessment & Plan Note (Signed)
Living will d/w pt.  Husband designated if patient were incapacitated.  

## 2021-06-27 NOTE — Assessment & Plan Note (Signed)
H/o low vit D, recheck pending.  See notes on labs.

## 2021-06-27 NOTE — Assessment & Plan Note (Signed)
Tetanus- 2023 Flu 2022 PNA not due Shingles d/w pt.   covid vaccine 2021 No Pap due to hysterectomy.   Mammogram 2022, d/w pt about getting follow up.   DXA not due yet.   HRT per gyn.   Living will d/w pt.  Husband designated if patient were incapacitated.   cologuard neg 2020.   Diet and exercise d/w pt.  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

## 2021-07-01 ENCOUNTER — Telehealth: Payer: Self-pay

## 2021-07-01 NOTE — Telephone Encounter (Signed)
Results have been discussed with patient. See result notes.

## 2021-07-01 NOTE — Telephone Encounter (Signed)
lmtcb

## 2021-07-01 NOTE — Telephone Encounter (Signed)
Patient is returning phone call to [facility] about lab results.

## 2021-07-05 NOTE — Progress Notes (Deleted)
Raynald Blend MD Reason for referral-dizziness and chest pain  HPI: 56 year old female for evaluation of dizziness and chest pain at request of Elsie Stain MD.  Patient seen previously but not since May 2019. Echo 12/08 showed normal LV function.  Laboratories May 2023 showed TSH 1.54, creatinine 0.80 with potassium 4.0, normal liver functions, LDL 165, hemoglobin 13.1.  Current Outpatient Medications  Medication Sig Dispense Refill   cetirizine (ZYRTEC) 10 MG tablet Take 10 mg by mouth daily.     Cholecalciferol (VITAMIN D3) 50 MCG (2000 UT) TABS Take 4,000 Units by mouth daily.     estradiol (CLIMARA - DOSED IN MG/24 HR) 0.1 mg/24hr patch Place 1 patch (0.1 mg total) onto the skin once a week. 12 patch 2   gabapentin (NEURONTIN) 100 MG capsule Take 1-2 capsules (100-200 mg total) by mouth 3 (three) times daily as needed. 90 capsule 3   Multiple Vitamins-Minerals (MULTI-VITAMIN GUMMIES) CHEW Chew 2 tablets by mouth daily.      omeprazole (PRILOSEC) 20 MG capsule Take 1 capsule (20 mg total) by mouth daily. 30 capsule 3   No current facility-administered medications for this visit.    Allergies  Allergen Reactions   Requip [Ropinirole Hcl]     Intolerant; makes her sleepy   Sulfonamide Derivatives Rash     Past Medical History:  Diagnosis Date   Anemia    Anxiety state, unspecified    GERD (gastroesophageal reflux disease)    Headache(784.0)    Other constipation    S/P hysterectomy with oophorectomy 01/20/2021   SVT (supraventricular tachycardia) [facility])     Past Surgical History:  Procedure Laterality Date   CARDIAC CATHETERIZATION  12/09/03   Normal   CERVICAL DISC SURGERY     Ruptured disc repair / and approach c5/6, c6/7; plate   HERNIA REPAIR     umbilical   LAPAROSCOPIC ASSISTED VAGINAL HYSTERECTOMY N/A 03/09/2015   Procedure: LAPAROSCOPIC ASSISTED VAGINAL HYSTERECTOMY;  Surgeon: Jonnie Kind, MD;  Location: [facility];  Service: Gynecology;   Laterality: N/A;   OPEN REDUCTION INTERNAL FIXATION (ORIF) DISTAL RADIAL FRACTURE Right 03/01/2018   Procedure: OPEN REDUCTION INTERNAL FIXATION (ORIF) DISTAL RADIAL FRACTURE;  Surgeon: Iran Planas, MD;  Location: [place];  Service: Orthopedics;  Laterality: Right;   RECTOCELE REPAIR N/A 03/09/2015   Procedure: POSTERIOR REPAIR (RECTOCELE);  Surgeon: Jonnie Kind, MD;  Location: [facility];  Service: Gynecology;  Laterality: N/A;   SALPINGOOPHORECTOMY Bilateral 03/09/2015   Procedure: SALPINGO OOPHORECTOMY;  Surgeon: Jonnie Kind, MD;  Location: [facility];  Service: Gynecology;  Laterality: Bilateral;   TUBAL LIGATION     VAGINAL DELIVERY  90, 97, 99, 03   x 4     Social History   Socioeconomic History   Marital status: Married    Spouse name: [name]   Number of children: 4   Years of education: 14   Highest education level: Not on file  Occupational History   Occupation: [employer]     Employer: [place]     Employer: [employer]  Tobacco Use   Smoking status: Never   Smokeless tobacco: Never  Vaping Use   Vaping Use: Never used  Substance and Sexual Activity   Alcohol use: No    Alcohol/week: 0.0 standard drinks   Drug use: No   Sexual activity: Yes    Birth control/protection: Surgical    Comment: tubal/hyst  Other Topics Concern  Not on file  Social History Narrative   Patient lives with her husband [name] ) and her children.   Married 1995   Patient has four children.   Patient works full-time.   Patient has a college education.   Patient is right-handed.   Patient drinks caffeine- once or twice a week.     Social Determinants of Health   Financial Resource Strain: Not on file  Food Insecurity: Not on file  Transportation Needs: Not on file  Physical Activity: Not on file  Stress: Not on file  Social Connections: Not on file  Intimate Partner Violence: Not on file    Family History  Problem Relation Age of  Onset   Deep vein thrombosis Mother        cerebral blood clot    Heart attack Mother    Heart failure Father    Breast cancer Sister    Depression Maternal Uncle    Colon cancer Maternal Uncle    Diabetes Maternal Grandmother    Stroke Maternal Grandmother    Cancer Maternal Grandfather        Prostate    Hypertension Neg Hx    Arthritis Neg Hx     ROS: no fevers or chills, productive cough, hemoptysis, dysphasia, odynophagia, melena, hematochezia, dysuria, hematuria, rash, seizure activity, orthopnea, PND, pedal edema, claudication. Remaining systems are negative.  Physical Exam:   Last menstrual period 01/31/2015.  General:  Well developed/well nourished in NAD Skin warm/dry Patient not depressed No peripheral clubbing Back-normal HEENT-normal/normal eyelids Neck supple/normal carotid upstroke bilaterally; no bruits; no JVD; no thyromegaly chest - CTA/ normal expansion CV - RRR/normal S1 and S2; no murmurs, rubs or gallops;  PMI nondisplaced Abdomen -NT/ND, no HSM, no mass, + bowel sounds, no bruit 2+ femoral pulses, no bruits Ext-no edema, chords, 2+ DP Neuro-grossly nonfocal  ECG - personally reviewed  A/P  1 dizziness-  2 chest pain-  3 hyperlipidemia-  4 history of SVT-  Kirk Ruths, MD

## 2021-07-08 ENCOUNTER — Ambulatory Visit: Payer: No Typology Code available for payment source | Admitting: Cardiology

## 2021-08-05 ENCOUNTER — Encounter: Payer: Self-pay | Admitting: *Deleted

## 2021-10-09 NOTE — Progress Notes (Deleted)
    Chloe Huneke T. Randell Detter, MD, [place] at [facility] [place], [address]  Phone: [phone]  FAX: [place] - 56 y.o. female  MRN [id number]  Date of Birth: 1965/03/17  Date: 10/10/2021  PCP: Tonia Ghent, MD  Referral: Tonia Ghent, MD  No chief complaint on file.  Subjective:   [name] is a 56 y.o. very pleasant female patient with There is no height or weight on file to calculate BMI. who presents with the following:  Pleasant young lady presents with a rash on her chest.    Review of Systems is noted in the HPI, as appropriate  Objective:   LMP 01/31/2015   GEN: No acute distress; alert,appropriate. PULM: Breathing comfortably in no respiratory distress PSYCH: Normally interactive.   Laboratory and Imaging Data:  Assessment and Plan:   ***

## 2021-10-10 ENCOUNTER — Ambulatory Visit: Payer: No Typology Code available for payment source | Admitting: Family Medicine

## 2021-10-10 DIAGNOSIS — R21 Rash and other nonspecific skin eruption: Secondary | ICD-10-CM

## 2021-11-20 NOTE — Progress Notes (Unsigned)
    Matsue Strom T. Keltin Baird, MD, [place] at [facility] [place], [address]  Phone: [phone]  FAX: [place] - 56 y.o. female  MRN [id number]  Date of Birth: Jun 13, 1965  Date: 11/21/2021  PCP: Tonia Ghent, MD  Referral: Tonia Ghent, MD  No chief complaint on file.  Subjective:   [name] is a 56 y.o. very pleasant female patient with There is no height or weight on file to calculate BMI. who presents with the following:  She presents with acute R leg pain:     Review of Systems is noted in the HPI, as appropriate  Objective:   LMP 01/31/2015   GEN: No acute distress; alert,appropriate. PULM: Breathing comfortably in no respiratory distress PSYCH: Normally interactive.   Laboratory and Imaging Data:  Assessment and Plan:   ***

## 2021-11-21 ENCOUNTER — Ambulatory Visit (INDEPENDENT_AMBULATORY_CARE_PROVIDER_SITE_OTHER): Payer: No Typology Code available for payment source | Admitting: Family Medicine

## 2021-11-21 ENCOUNTER — Encounter: Payer: Self-pay | Admitting: Family Medicine

## 2021-11-21 VITALS — BP 102/72 | HR 72 | Temp 98.1°F | Ht 65.0 in | Wt 219.5 lb

## 2021-11-21 DIAGNOSIS — M1611 Unilateral primary osteoarthritis, right hip: Secondary | ICD-10-CM | POA: Diagnosis not present

## 2021-11-21 DIAGNOSIS — M5416 Radiculopathy, lumbar region: Secondary | ICD-10-CM | POA: Diagnosis not present

## 2021-11-21 DIAGNOSIS — M5412 Radiculopathy, cervical region: Secondary | ICD-10-CM

## 2021-11-21 MED ORDER — CYCLOBENZAPRINE HCL 10 MG PO TABS: 5.0000 mg | ORAL_TABLET | Freq: Every evening | ORAL | 2 refills | Status: DC | PRN

## 2021-11-21 MED ORDER — PREDNISONE 20 MG PO TABS: ORAL_TABLET | ORAL | 0 refills | Status: DC

## 2021-12-15 ENCOUNTER — Ambulatory Visit: Payer: No Typology Code available for payment source | Attending: Family Medicine

## 2021-12-15 ENCOUNTER — Other Ambulatory Visit: Payer: Self-pay

## 2021-12-15 DIAGNOSIS — M5412 Radiculopathy, cervical region: Secondary | ICD-10-CM | POA: Insufficient documentation

## 2021-12-15 DIAGNOSIS — M6281 Muscle weakness (generalized): Secondary | ICD-10-CM | POA: Diagnosis present

## 2021-12-15 DIAGNOSIS — G8929 Other chronic pain: Secondary | ICD-10-CM | POA: Insufficient documentation

## 2021-12-15 DIAGNOSIS — M5416 Radiculopathy, lumbar region: Secondary | ICD-10-CM | POA: Diagnosis not present

## 2021-12-15 DIAGNOSIS — M5441 Lumbago with sciatica, right side: Secondary | ICD-10-CM | POA: Insufficient documentation

## 2021-12-15 DIAGNOSIS — M1611 Unilateral primary osteoarthritis, right hip: Secondary | ICD-10-CM | POA: Diagnosis not present

## 2021-12-15 NOTE — Therapy (Signed)
OUTPATIENT PHYSICAL THERAPY THORACOLUMBAR EVALUATION   Patient Name: [name] MRN: [id number] DOB:03-Mar-1965, 56 y.o., female Today's Date: 12/16/2021  END OF SESSION:  PT End of Session - 12/16/21 0614     Visit Number 1    Number of Visits 13    Date for PT Re-Evaluation 01/27/22    Authorization Type  FOCUS    Progress Note Due on Visit 10    PT Start Time 1505    PT Stop Time 1550    PT Time Calculation (min) 45 min    Activity Tolerance Patient tolerated treatment well    Behavior During Therapy WFL for tasks assessed/performed             Past Medical History:  Diagnosis Date   Anemia    Anxiety state, unspecified    GERD (gastroesophageal reflux disease)    Headache(784.0)    Other constipation    S/P hysterectomy with oophorectomy 01/20/2021   SVT (supraventricular tachycardia)    Past Surgical History:  Procedure Laterality Date   CARDIAC CATHETERIZATION  12/09/03   Normal   CERVICAL DISC SURGERY     Ruptured disc repair / and approach c5/6, c6/7; plate   HERNIA REPAIR     umbilical   LAPAROSCOPIC ASSISTED VAGINAL HYSTERECTOMY N/A 03/09/2015   Procedure: LAPAROSCOPIC ASSISTED VAGINAL HYSTERECTOMY;  Surgeon: Jonnie Kind, MD;  Location: [facility];  Service: Gynecology;  Laterality: N/A;   OPEN REDUCTION INTERNAL FIXATION (ORIF) DISTAL RADIAL FRACTURE Right 03/01/2018   Procedure: OPEN REDUCTION INTERNAL FIXATION (ORIF) DISTAL RADIAL FRACTURE;  Surgeon: Iran Planas, MD;  Location: [place];  Service: Orthopedics;  Laterality: Right;   RECTOCELE REPAIR N/A 03/09/2015   Procedure: POSTERIOR REPAIR (RECTOCELE);  Surgeon: Jonnie Kind, MD;  Location: [facility];  Service: Gynecology;  Laterality: N/A;   SALPINGOOPHORECTOMY Bilateral 03/09/2015   Procedure: SALPINGO OOPHORECTOMY;  Surgeon: Jonnie Kind, MD;  Location: [facility];  Service: Gynecology;  Laterality: Bilateral;   TUBAL LIGATION     VAGINAL DELIVERY  90, 97, 99, 03   x 4    Patient Active  Problem List   Diagnosis Date Noted   Advance care planning 06/27/2021   Vitamin D deficiency 06/27/2021   Mixed stress and urge urinary incontinence 01/20/2021   Current use of estrogen therapy 01/20/2021   LFT elevation 06/13/2020   Hematuria, microscopic 06/12/2019   Atypical chest pain 05/02/2018   Fatigue 11/08/2017   Lightheadedness 05/02/2017   Routine general medical examination at a health care facility 10/04/2015   Paresthesia 12/05/2012   GERD (gastroesophageal reflux disease) 07/28/2010   ECZEMA 01/21/2008   CHRONIC RHINITIS 10/01/2007   HEADACHE, CHRONIC 10/01/2007   HYPERCHOLESTEROLEMIA 03/27/2007   SUPRAVENTRICULAR TACHYCARDIA 03/27/2007   CONSTIPATION, CHRONIC 03/27/2007   ANXIETY, MILD 07/16/2006    PCP: Tonia Ghent, MD   REFERRING PROVIDER: Owens Loffler, MD   REFERRING DIAG: M54.16 (ICD-10-CM) - Lumbar radiculopathy, acute; M54.12 (ICD-10-CM) - Cervical radiculopathy, acute; M16.11 (ICD-10-CM) - Localized osteoarthrosis of right hip  Rationale for Evaluation and Treatment: Rehabilitation  THERAPY DIAG:  Chronic right-sided low back pain with right-sided sciatica  Muscle weakness (generalized)  ONSET DATE: Chronic with a increase in pain around June   SUBJECTIVE:  SUBJECTIVE STATEMENT: Pt reports pain of her R lateral hip/flank radiating down her R leg after prolonged lying or sitting which increased around June 2023. This pain improved after a predisone dose pack about 1 month ago. Pt reports she has been walking more and has progressed to 2 miles.  PERTINENT HISTORY:  High BMI  PAIN:  Are you having pain? Yes: NPRS scale: 2/10 Pain location: R lateral hip/flank Pain description: ache  Aggravating factors: prolonged standing and walking, standing after  prolonged sitting or lying Relieving factors: Sitting Pain range on eval=2-5/10  PRECAUTIONS: None  WEIGHT BEARING RESTRICTIONS: No  FALLS:  Has patient fallen in last 6 months? Yes. Number of falls 1  Not steeping up high enough on a curb and tripping. Pt does not have a concern about her balance.  LIVING ENVIRONMENT: Lives with: lives with their family Lives in: House/apartment No issue with accessing or mobility within home  OCCUPATION: Sleep tech, a lot of work on a computer  PLOF: Independent  PATIENT GOALS: To have less pain  NEXT MD VISIT: None scheduled  OBJECTIVE:   DIAGNOSTIC FINDINGS:  Xray Lumbar 06/23/21 FINDINGS: Lumbar vertebral body height are preserved without evidence of fracture. Grade 1 anterolisthesis of L4 relative to L3 and L5. Moderate intervertebral disc space narrowing at L5-S1 and mild narrowing at L4-L5. Facet arthropathy which is most severe at L4-L5.   IMPRESSION: Degenerative changes of the lumbar spine as described.  Xray HIP 06/25/21 FINDINGS: No acute fracture or hip dislocation is identified. There is mild bilateral hip joint space narrowing, right slightly greater than left. No destructive osseous lesion is identified.   IMPRESSION: Mild bilateral hip osteoarthrosis.   PATIENT SURVEYS:  FOTO: Perceived function   50%, predicted   63%   SCREENING FOR RED FLAGS: Bowel or bladder incontinence: No Spinal tumors: No Cauda equina syndrome: No Compression fracture: No  COGNITION: Overall cognitive status: Within functional limits for tasks assessed     SENSATION: WFL  MUSCLE LENGTH: Hamstrings: Right WNLs deg; Left WNLs deg Marcello Moores test: Right tight deg; Left tight deg  POSTURE: rounded shoulders, forward head, and increased lumbar lordosis, anterior pelvic tilt  PALPATION: TTP R paraspinals and anteriorlateral hip  LUMBAR ROM:   AROM eval  Flexion Full lordosis does not fully reverse   Extension Mod limitation*   Right lateral flexion Min limitation*  Left lateral flexion Full  Right rotation Full  Left rotation Full   * denotes provoked pain  LOWER EXTREMITY ROM:    Grossly WNLs and equal except bilat hip IR is min decreased. Pain not reproduced Passive  Right eval Left eval  Hip flexion    Hip extension    Hip abduction    Hip adduction    Hip internal rotation    Hip external rotation    Knee flexion    Knee extension    Ankle dorsiflexion    Ankle plantarflexion    Ankle inversion    Ankle eversion     (Blank rows = not tested)  LOWER EXTREMITY MMT:   Decreased core strength MMT Right eval Left eval  Hip flexion 4 4+  Hip extension 3+ 4  Hip abduction 4 4+  Hip adduction    Hip internal rotation    Hip external rotation 4 4+  Knee flexion    Knee extension    Ankle dorsiflexion    Ankle plantarflexion    Ankle inversion    Ankle eversion     (Blank  rows = not tested)  LUMBAR SPECIAL TESTS:  Straight leg raise test: Negative, Slump test: Negative, and SI Compression/distraction test: Negative  FUNCTIONAL TESTS:  NT  GAIT: Distance walked: 220f Assistive device utilized: None Level of assistance: Complete Independence Comments: WNLS  TODAY'STREATMENT:                                                                                                                              [facility] Adult PT Treatment:                                                DATE: 12/16/21 Therapeutic Exercise: Development and instruction in the completion of below HEP   PATIENT EDUCATION:  Education details: Eval findings, POC, HEP, self care Person educated: Patient Education method: Explanation, Demonstration, Tactile cues, Verbal cues, and Handouts Education comprehension: verbalized understanding, returned demonstration, verbal cues required, and tactile cues required  HOME EXERCISE PROGRAM: Access Code: [email or web address] Date:  12/16/2021 Prepared by: AGar Ponto Exercises - Supine Posterior Pelvic Tilt  - 1 x daily - 7 x weekly - 2 sets - 10 reps - 3 hold - Seated Flexion Stretch with Swiss Ball  - 1 x daily - 7 x weekly - 1 sets - 10 reps - 10 hold  ASSESSMENT:  CLINICAL IMPRESSION: Patient is a 56y.o. female who was seen today for physical therapy evaluation and treatment for M54.16 (ICD-10-CM) - Lumbar radiculopathy, acute; M54.12 (ICD-10-CM) - Cervical radiculopathy, acute; M16.11 (ICD-10-CM) - Localized osteoarthrosis of right hip. Pt presents to PT with decrease trunk ROMs limited by pain, R hip and core weakness, increased lumbar lordosis which does not fully reverse with forward bending, and decreased standing and walking tolerance, with standing the greater issue.  OBJECTIVE IMPAIRMENTS: decreased activity tolerance, decreased mobility, decreased ROM, decreased strength, postural dysfunction, obesity, and pain.   ACTIVITY LIMITATIONS: carrying, lifting, bending, standing, squatting, sleeping, and transfers  PARTICIPATION LIMITATIONS: meal prep, cleaning, laundry, shopping, community activity, and yard work  PERSONAL FACTORS: Fitness, Past/current experiences, Time since onset of injury/illness/exacerbation, and 1 comorbidity: high BMI  are also affecting patient's functional outcome.   REHAB POTENTIAL: Good  CLINICAL DECISION MAKING: Evolving/moderate complexity  EVALUATION COMPLEXITY: Moderate   GOALS:  SHORT TERM GOALS: Target date: 01/06/22  Pt will be Ind in an initial HEP Baseline: initiated Goal status: INITIAL  2.  Pt will voice understanding of measures to assist in pain reduction Baseline: initated Goal status: INITIAL  LONG TERM GOALS: Target date: 02/03/22  Pt will be Ind in a final HEP to maintain achieved LOF Baseline: initiated Goal status: INITIAL  2.  Increase trunk ROM for extension and R side bending to min limitation or less for improved function and indication of  decreased pain. Baseline: see flow sheet Goal status: INITIAL  3.  Pt will report a decrease in her low back/R leg pain to 3/10 or less with daily home and work related activities for improved QOL Baseline: 2-5/10 Goal status: INITIAL  4.  Increase R hip strength to 4+ or greater for improved improved lumbopelvic stability and function Baseline: see flow sheets Goal status: INITIAL  5.  Pt's FOTO score will improved to the predicted value of 63% as indication of improved function  Baseline:50% Goal status: INITIAL   PLAN:  PT FREQUENCY: 2x/week  PT DURATION: 6 weeks  PLANNED INTERVENTIONS: Therapeutic exercises, Therapeutic activity, Patient/Family education, Self Care, Dry Needling, Electrical stimulation, Spinal manipulation, Spinal mobilization, Cryotherapy, Moist heat, Taping, Ionotophoresis '4mg'$ /ml Dexamethasone, Manual therapy, and Re-evaluation.  PLAN FOR NEXT SESSION: Review FOTO; assess response to HEP; progress therex as indicated; use of modalities, manual therapy; and TPDN as indicated.  Sueann Brownley MS, PT 12/16/21 6:29 AM

## 2021-12-30 ENCOUNTER — Ambulatory Visit: Payer: No Typology Code available for payment source | Attending: Family Medicine | Admitting: Physical Therapy

## 2021-12-30 ENCOUNTER — Encounter: Payer: Self-pay | Admitting: Physical Therapy

## 2021-12-30 DIAGNOSIS — M6281 Muscle weakness (generalized): Secondary | ICD-10-CM | POA: Diagnosis present

## 2021-12-30 DIAGNOSIS — G8929 Other chronic pain: Secondary | ICD-10-CM | POA: Insufficient documentation

## 2021-12-30 DIAGNOSIS — M5441 Lumbago with sciatica, right side: Secondary | ICD-10-CM | POA: Diagnosis present

## 2021-12-30 NOTE — Therapy (Signed)
OUTPATIENT PHYSICAL THERAPY TREATMENT NOTE   Patient Name: [name] MRN: [id number] DOB:1965/10/05, 56 y.o., female Today's Date: 12/30/2021  PCP: Tonia Ghent, MD     REFERRING PROVIDER: Owens Loffler, MD  END OF SESSION:   PT End of Session - 12/30/21 1112     Visit Number 2    Number of Visits 13    Date for PT Re-Evaluation 01/27/22    Authorization Type [place] [employer]    Progress Note Due on Visit 10    PT Start Time 1105    PT Stop Time 1145    PT Time Calculation (min) 40 min             Past Medical History:  Diagnosis Date   Anemia    Anxiety state, unspecified    GERD (gastroesophageal reflux disease)    Headache(784.0)    Other constipation    S/P hysterectomy with oophorectomy 01/20/2021   SVT (supraventricular tachycardia)    Past Surgical History:  Procedure Laterality Date   CARDIAC CATHETERIZATION  12/09/03   Normal   CERVICAL DISC SURGERY     Ruptured disc repair / and approach c5/6, c6/7; plate   HERNIA REPAIR     umbilical   LAPAROSCOPIC ASSISTED VAGINAL HYSTERECTOMY N/A 03/09/2015   Procedure: LAPAROSCOPIC ASSISTED VAGINAL HYSTERECTOMY;  Surgeon: Jonnie Kind, MD;  Location: [facility];  Service: Gynecology;  Laterality: N/A;   OPEN REDUCTION INTERNAL FIXATION (ORIF) DISTAL RADIAL FRACTURE Right 03/01/2018   Procedure: OPEN REDUCTION INTERNAL FIXATION (ORIF) DISTAL RADIAL FRACTURE;  Surgeon: Iran Planas, MD;  Location: [place];  Service: Orthopedics;  Laterality: Right;   RECTOCELE REPAIR N/A 03/09/2015   Procedure: POSTERIOR REPAIR (RECTOCELE);  Surgeon: Jonnie Kind, MD;  Location: [facility];  Service: Gynecology;  Laterality: N/A;   SALPINGOOPHORECTOMY Bilateral 03/09/2015   Procedure: SALPINGO OOPHORECTOMY;  Surgeon: Jonnie Kind, MD;  Location: [facility];  Service: Gynecology;  Laterality: Bilateral;   TUBAL LIGATION     VAGINAL DELIVERY  90, 97, 99, 03   x 4    Patient Active Problem List   Diagnosis Date Noted   Advance  care planning 06/27/2021   Vitamin D deficiency 06/27/2021   Mixed stress and urge urinary incontinence 01/20/2021   Current use of estrogen therapy 01/20/2021   LFT elevation 06/13/2020   Hematuria, microscopic 06/12/2019   Atypical chest pain 05/02/2018   Fatigue 11/08/2017   Lightheadedness 05/02/2017   Routine general medical examination at a health care facility 10/04/2015   Paresthesia 12/05/2012   GERD (gastroesophageal reflux disease) 07/28/2010   ECZEMA 01/21/2008   CHRONIC RHINITIS 10/01/2007   HEADACHE, CHRONIC 10/01/2007   HYPERCHOLESTEROLEMIA 03/27/2007   SUPRAVENTRICULAR TACHYCARDIA 03/27/2007   CONSTIPATION, CHRONIC 03/27/2007   ANXIETY, MILD 07/16/2006    REFERRING DIAG: M54.16 (ICD-10-CM) - Lumbar radiculopathy, acute; M54.12 (ICD-10-CM) - Cervical radiculopathy, acute; M16.11 (ICD-10-CM) - Localized osteoarthrosis of right hip  THERAPY DIAG:  Chronic right-sided low back pain with right-sided sciatica  Muscle weakness (generalized)  Rationale for Evaluation and Treatment Rehabilitation  PERTINENT HISTORY:  High BMI  PRECAUTIONS: None  SUBJECTIVE:  SUBJECTIVE STATEMENT:  I am doing okay today. I have some trouble with the pelvic tilt exercise.    PAIN:  Are you having pain? Yes: NPRS scale: 2/10 Pain location: R lateral hip/flank Pain description: ache  Aggravating factors: prolonged standing and walking, standing after prolonged sitting or lying Relieving factors: Sitting Pain range on eval=2-5/10   OBJECTIVE: (objective measures completed at initial evaluation unless otherwise dated)   DIAGNOSTIC FINDINGS:  Xray Lumbar 06/23/21 FINDINGS: Lumbar vertebral body height are preserved without evidence of fracture. Grade 1 anterolisthesis of L4 relative to L3 and  L5. Moderate intervertebral disc space narrowing at L5-S1 and mild narrowing at L4-L5. Facet arthropathy which is most severe at L4-L5.   IMPRESSION: Degenerative changes of the lumbar spine as described.   Xray HIP 06/25/21 FINDINGS: No acute fracture or hip dislocation is identified. There is mild bilateral hip joint space narrowing, right slightly greater than left. No destructive osseous lesion is identified.   IMPRESSION: Mild bilateral hip osteoarthrosis.     PATIENT SURVEYS:  FOTO: Perceived function   50%, predicted   63%    SCREENING FOR RED FLAGS: Bowel or bladder incontinence: No Spinal tumors: No Cauda equina syndrome: No Compression fracture: No   COGNITION: Overall cognitive status: Within functional limits for tasks assessed                          SENSATION: WFL   MUSCLE LENGTH: Hamstrings: Right WNLs deg; Left WNLs deg Marcello Moores test: Right tight deg; Left tight deg   POSTURE: rounded shoulders, forward head, and increased lumbar lordosis, anterior pelvic tilt   PALPATION: TTP R paraspinals and anteriorlateral hip   LUMBAR ROM:    AROM eval  Flexion Full lordosis does not fully reverse   Extension Mod limitation*  Right lateral flexion Min limitation*  Left lateral flexion Full  Right rotation Full  Left rotation Full   * denotes provoked pain   LOWER EXTREMITY ROM:    Grossly WNLs and equal except bilat hip IR is min decreased. Pain not reproduced Passive  Right eval Left eval  Hip flexion      Hip extension      Hip abduction      Hip adduction      Hip internal rotation      Hip external rotation      Knee flexion      Knee extension      Ankle dorsiflexion      Ankle plantarflexion      Ankle inversion      Ankle eversion       (Blank rows = not tested)   LOWER EXTREMITY MMT:   Decreased core strength MMT Right eval Left eval  Hip flexion 4 4+  Hip extension 3+ 4  Hip abduction 4 4+  Hip adduction      Hip internal  rotation      Hip external rotation 4 4+  Knee flexion      Knee extension      Ankle dorsiflexion      Ankle plantarflexion      Ankle inversion      Ankle eversion       (Blank rows = not tested)   LUMBAR SPECIAL TESTS:  Straight leg raise test: Negative, Slump test: Negative, and SI Compression/distraction test: Negative   FUNCTIONAL TESTS:  NT   GAIT: Distance walked: 240f Assistive device utilized: None Level of assistance: Complete Independence  Comments: WNLS   TODAY'STREATMENT:                                                                                                                              [facility] Adult PT Treatment:                                                DATE: 12/30/21 Therapeutic Exercise: Nustep L5 UE/LE x 5 minutes Seated lumbar flexion with ball Seated lumbar lateral flexion with ball  Supine PPT 5 sec x 10 Supine PPT to [employer] with Green band  Bridge with band    [facility] Adult PT Treatment:                                                DATE: 12/16/21 Therapeutic Exercise: Development and instruction in the completion of below HEP    PATIENT EDUCATION:  Education details: Eval findings, POC, HEP, self care Person educated: Patient Education method: Explanation, Demonstration, Tactile cues, Verbal cues, and Handouts Education comprehension: verbalized understanding, returned demonstration, verbal cues required, and tactile cues required   HOME EXERCISE PROGRAM: Access Code: [place] URL: [email or web address] Date: 12/16/2021 Prepared by: Gar Ponto   Exercises - Supine Posterior Pelvic Tilt  - 1 x daily - 7 x weekly - 2 sets - 10 reps - 3 hold - Seated Flexion Stretch with Swiss Ball  - 1 x daily - 7 x weekly - 1 sets - 10 reps - 10 hold Added - Child's Pose with Sidebending  - 1 x daily - 7 x weekly - 1 sets - 10 reps - 10 hold - Supine Bridge with Resistance Band  - 1 x daily - 7 x weekly - 2-3 sets - 10 reps - 5  hold   ASSESSMENT:   CLINICAL IMPRESSION: Patient is a 56 y.o. female who was seen today for physical therapy evaluation and treatment for M54.16 (ICD-10-CM) - Lumbar radiculopathy, acute; M54.12 (ICD-10-CM) - Cervical radiculopathy, acute; M16.11 (ICD-10-CM) - Localized osteoarthrosis of right hip. Reviewed HEP and progressed with lumbar stretching and core/ hip stabilization. Able to update HEP today. She reports concern with the way she walks which is with slight forward flexion. Will continue to address trunk flexibility.    OBJECTIVE IMPAIRMENTS: decreased activity tolerance, decreased mobility, decreased ROM, decreased strength, postural dysfunction, obesity, and pain.    ACTIVITY LIMITATIONS: carrying, lifting, bending, standing, squatting, sleeping, and transfers   PARTICIPATION LIMITATIONS: meal prep, cleaning, laundry, shopping, community activity, and yard work   PERSONAL FACTORS: Fitness, Past/current experiences, Time since onset of injury/illness/exacerbation, and 1 comorbidity: high BMI  are also affecting patient's functional outcome.    REHAB POTENTIAL:  Good   CLINICAL DECISION MAKING: Evolving/moderate complexity   EVALUATION COMPLEXITY: Moderate     GOALS:   SHORT TERM GOALS: Target date: 01/06/22   Pt will be Ind in an initial HEP Baseline: initiated Goal status: INITIAL   2.  Pt will voice understanding of measures to assist in pain reduction Baseline: initated Goal status: INITIAL   LONG TERM GOALS: Target date: 02/03/22   Pt will be Ind in a final HEP to maintain achieved LOF Baseline: initiated Goal status: INITIAL   2.  Increase trunk ROM for extension and R side bending to min limitation or less for improved function and indication of decreased pain. Baseline: see flow sheet Goal status: INITIAL   3.  Pt will report a decrease in her low back/R leg pain to 3/10 or less with daily home and work related activities for improved QOL Baseline:  2-5/10 Goal status: INITIAL   4.  Increase R hip strength to 4+ or greater for improved improved lumbopelvic stability and function Baseline: see flow sheets Goal status: INITIAL   5.  Pt's FOTO score will improved to the predicted value of 63% as indication of improved function  Baseline:50% Goal status: INITIAL     PLAN:   PT FREQUENCY: 2x/week   PT DURATION: 6 weeks   PLANNED INTERVENTIONS: Therapeutic exercises, Therapeutic activity, Patient/Family education, Self Care, Dry Needling, Electrical stimulation, Spinal manipulation, Spinal mobilization, Cryotherapy, Moist heat, Taping, Ionotophoresis '4mg'$ /ml Dexamethasone, Manual therapy, and Re-evaluation.   PLAN FOR NEXT SESSION: Review FOTO; assess response to HEP; progress therex as indicated; use of modalities, manual therapy; and TPDN as indicated.     Hessie Diener, PTA 12/30/21 12:53 PM Phone: [phone] Fax: [phone]

## 2022-01-06 ENCOUNTER — Encounter: Payer: Self-pay | Admitting: Physical Therapy

## 2022-01-06 ENCOUNTER — Ambulatory Visit: Payer: No Typology Code available for payment source | Admitting: Physical Therapy

## 2022-01-06 DIAGNOSIS — G8929 Other chronic pain: Secondary | ICD-10-CM

## 2022-01-06 DIAGNOSIS — M6281 Muscle weakness (generalized): Secondary | ICD-10-CM

## 2022-01-06 DIAGNOSIS — M5441 Lumbago with sciatica, right side: Secondary | ICD-10-CM | POA: Diagnosis not present

## 2022-01-06 NOTE — Therapy (Addendum)
OUTPATIENT PHYSICAL THERAPY TREATMENT NOTE/Discharge   Patient Name: [name] MRN: [id number] DOB:1965-10-07, 56 y.o., female Today's Date: 01/06/2022  PCP: Tonia Ghent, MD     REFERRING PROVIDER: Owens Loffler, MD  END OF SESSION:   PT End of Session - 01/06/22 1110     Visit Number 3    Number of Visits 13    Date for PT Re-Evaluation 01/27/22    Authorization Type [place] [employer]OC[employer]    PT Start Time 1106    PT Stop Time 1145    PT Time Calculation (min) 39 min             Past Medical History:  Diagnosis Date   Anemia    Anxiety state, unspecified    GERD (gastroesophageal reflux disease)    Headache(784.0)    Other constipation    S/P hysterectomy with oophorectomy 01/20/2021   SVT (supraventricular tachycardia)    Past Surgical History:  Procedure Laterality Date   CARDIAC CATHETERIZATION  12/09/03   Normal   CERVICAL DISC SURGERY     Ruptured disc repair / and approach c5/6, c6/7; plate   HERNIA REPAIR     umbilical   LAPAROSCOPIC ASSISTED VAGINAL HYSTERECTOMY N/A 03/09/2015   Procedure: LAPAROSCOPIC ASSISTED VAGINAL HYSTERECTOMY;  Surgeon: Jonnie Kind, MD;  Location: [facility];  Service: Gynecology;  Laterality: N/A;   OPEN REDUCTION INTERNAL FIXATION (ORIF) DISTAL RADIAL FRACTURE Right 03/01/2018   Procedure: OPEN REDUCTION INTERNAL FIXATION (ORIF) DISTAL RADIAL FRACTURE;  Surgeon: Iran Planas, MD;  Location: [place];  Service: Orthopedics;  Laterality: Right;   RECTOCELE REPAIR N/A 03/09/2015   Procedure: POSTERIOR REPAIR (RECTOCELE);  Surgeon: Jonnie Kind, MD;  Location: [facility];  Service: Gynecology;  Laterality: N/A;   SALPINGOOPHORECTOMY Bilateral 03/09/2015   Procedure: SALPINGO OOPHORECTOMY;  Surgeon: Jonnie Kind, MD;  Location: [facility];  Service: Gynecology;  Laterality: Bilateral;   TUBAL LIGATION     VAGINAL DELIVERY  90, 97, 99, 03   x 4    Patient Active Problem List   Diagnosis Date Noted   Advance care planning 06/27/2021    Vitamin D deficiency 06/27/2021   Mixed stress and urge urinary incontinence 01/20/2021   Current use of estrogen therapy 01/20/2021   LFT elevation 06/13/2020   Hematuria, microscopic 06/12/2019   Atypical chest pain 05/02/2018   Fatigue 11/08/2017   Lightheadedness 05/02/2017   Routine general medical examination at a health care facility 10/04/2015   Paresthesia 12/05/2012   GERD (gastroesophageal reflux disease) 07/28/2010   ECZEMA 01/21/2008   CHRONIC RHINITIS 10/01/2007   HEADACHE, CHRONIC 10/01/2007   HYPERCHOLESTEROLEMIA 03/27/2007   SUPRAVENTRICULAR TACHYCARDIA 03/27/2007   CONSTIPATION, CHRONIC 03/27/2007   ANXIETY, MILD 07/16/2006    REFERRING DIAG: M54.16 (ICD-10-CM) - Lumbar radiculopathy, acute; M54.12 (ICD-10-CM) - Cervical radiculopathy, acute; M16.11 (ICD-10-CM) - Localized osteoarthrosis of right hip  THERAPY DIAG:  Chronic right-sided low back pain with right-sided sciatica  Muscle weakness (generalized)  Rationale for Evaluation and Treatment Rehabilitation  PERTINENT HISTORY:  High BMI  PRECAUTIONS: None  SUBJECTIVE:  SUBJECTIVE STATEMENT:  I was in so much pain. I started wearing a girdle and it has helped decrease my pain some.   PAIN:  Are you having pain? Yes: NPRS scale: 2/10, by end of day 7/10 Pain location: R lateral hip/flank Pain description: ache  Aggravating factors: prolonged standing and walking, standing after prolonged sitting or lying Relieving factors: Sitting Pain range on eval=2-5/10   OBJECTIVE: (objective measures completed at initial evaluation unless otherwise dated)   DIAGNOSTIC FINDINGS:  Xray Lumbar 06/23/21 FINDINGS: Lumbar vertebral body height are preserved without evidence of fracture. Grade 1 anterolisthesis of L4 relative to L3  and L5. Moderate intervertebral disc space narrowing at L5-S1 and mild narrowing at L4-L5. Facet arthropathy which is most severe at L4-L5.   IMPRESSION: Degenerative changes of the lumbar spine as described.   Xray HIP 06/25/21 FINDINGS: No acute fracture or hip dislocation is identified. There is mild bilateral hip joint space narrowing, right slightly greater than left. No destructive osseous lesion is identified.   IMPRESSION: Mild bilateral hip osteoarthrosis.     PATIENT SURVEYS:  FOTO: Perceived function   50%, predicted   63%    SCREENING FOR RED FLAGS: Bowel or bladder incontinence: No Spinal tumors: No Cauda equina syndrome: No Compression fracture: No   COGNITION: Overall cognitive status: Within functional limits for tasks assessed                          SENSATION: WFL   MUSCLE LENGTH: Hamstrings: Right WNLs deg; Left WNLs deg Marcello Moores test: Right tight deg; Left tight deg   POSTURE: rounded shoulders, forward head, and increased lumbar lordosis, anterior pelvic tilt   PALPATION: TTP R paraspinals and anteriorlateral hip   LUMBAR ROM:    AROM eval  Flexion Full lordosis does not fully reverse   Extension Mod limitation*  Right lateral flexion Min limitation*  Left lateral flexion Full  Right rotation Full  Left rotation Full   * denotes provoked pain   LOWER EXTREMITY ROM:    Grossly WNLs and equal except bilat hip IR is min decreased. Pain not reproduced Passive  Right eval Left eval  Hip flexion      Hip extension      Hip abduction      Hip adduction      Hip internal rotation      Hip external rotation      Knee flexion      Knee extension      Ankle dorsiflexion      Ankle plantarflexion      Ankle inversion      Ankle eversion       (Blank rows = not tested)   LOWER EXTREMITY MMT:   Decreased core strength MMT Right eval Left eval  Hip flexion 4 4+  Hip extension 3+ 4  Hip abduction 4 4+  Hip adduction      Hip internal  rotation      Hip external rotation 4 4+  Knee flexion      Knee extension      Ankle dorsiflexion      Ankle plantarflexion      Ankle inversion      Ankle eversion       (Blank rows = not tested)   LUMBAR SPECIAL TESTS:  Straight leg raise test: Negative, Slump test: Negative, and SI Compression/distraction test: Negative   FUNCTIONAL TESTS:  NT   GAIT: Distance walked: 262f  Assistive device utilized: None Level of assistance: Complete Independence Comments: WNLS   TODAY'STREATMENT:                                                                                                                              [facility] Adult PT Treatment:                                                DATE: 01/06/22 Therapeutic Exercise: Nustep L5 UE/ LE  x 6 minutes  PPT 5 sec x 10  PPT to bridge  Banded bridge  PPT with MARCH  PPT with Alt knee fall out Childs pose with laterals Qped alt Le ext- unable to reach neutral extension   Prone quad stretch with strap x 3 each Hip flexor stretch EOM , bilat   [facility] Adult PT Treatment:                                                DATE: 12/30/21 Therapeutic Exercise: Nustep L5 UE/LE x 5 minutes Seated lumbar flexion with ball Seated lumbar lateral flexion with ball  Supine PPT 5 sec x 10 Supine PPT to [employer] with Green band  Bridge with band    [facility] Adult PT Treatment:                                                DATE: 12/16/21 Therapeutic Exercise: Development and instruction in the completion of below HEP    PATIENT EDUCATION:  Education details: Eval findings, POC, HEP, self care Person educated: Patient Education method: Explanation, Demonstration, Tactile cues, Verbal cues, and Handouts Education comprehension: verbalized understanding, returned demonstration, verbal cues required, and tactile cues required   HOME EXERCISE PROGRAM: Access Code: [place] URL: [email or web address] Date: 12/16/2021 Prepared by:  Gar Ponto   Exercises - Supine Posterior Pelvic Tilt  - 1 x daily - 7 x weekly - 2 sets - 10 reps - 3 hold - Seated Flexion Stretch with Swiss Ball  - 1 x daily - 7 x weekly - 1 sets - 10 reps - 10 hold Added - Child's Pose with Sidebending  - 1 x daily - 7 x weekly - 1 sets - 10 reps - 10 hold - Supine Bridge with Resistance Band  - 1 x daily - 7 x weekly - 2-3 sets - 10 reps - 5 hold Added 01/06/22 Hip flexor stretch EOM   ASSESSMENT:   CLINICAL IMPRESSION: Patient is a 56 y.o. female who was seen today for physical therapy evaluation and  treatment for M54.16 (ICD-10-CM) - Lumbar radiculopathy, acute; M54.12 (ICD-10-CM) - Cervical radiculopathy, acute; M16.11 (ICD-10-CM) - Localized osteoarthrosis of right hip. Pt reports compliance with HEP. She continues to have daily pain that progressed to 7/10 each day and most aggravated with walking. Reviewed HEP and progressed with lumbar stretching and core/ hip stabilization. She Addressed quad and hip flexor tightness and updated HEP.    OBJECTIVE IMPAIRMENTS: decreased activity tolerance, decreased mobility, decreased ROM, decreased strength, postural dysfunction, obesity, and pain.    ACTIVITY LIMITATIONS: carrying, lifting, bending, standing, squatting, sleeping, and transfers   PARTICIPATION LIMITATIONS: meal prep, cleaning, laundry, shopping, community activity, and yard work   PERSONAL FACTORS: Fitness, Past/current experiences, Time since onset of injury/illness/exacerbation, and 1 comorbidity: high BMI  are also affecting patient's functional outcome.    REHAB POTENTIAL: Good   CLINICAL DECISION MAKING: Evolving/moderate complexity   EVALUATION COMPLEXITY: Moderate     GOALS:   SHORT TERM GOALS: Target date: 01/06/22   Pt will be Ind in an initial HEP Baseline: initiated Goal status: INITIAL   2.  Pt will voice understanding of measures to assist in pain reduction Baseline: initated Goal status: INITIAL   LONG TERM  GOALS: Target date: 02/03/22   Pt will be Ind in a final HEP to maintain achieved LOF Baseline: initiated Goal status: INITIAL   2.  Increase trunk ROM for extension and R side bending to min limitation or less for improved function and indication of decreased pain. Baseline: see flow sheet Goal status: INITIAL   3.  Pt will report a decrease in her low back/R leg pain to 3/10 or less with daily home and work related activities for improved QOL Baseline: 2-5/10 Goal status: INITIAL   4.  Increase R hip strength to 4+ or greater for improved improved lumbopelvic stability and function Baseline: see flow sheets Goal status: INITIAL   5.  Pt's FOTO score will improved to the predicted value of 63% as indication of improved function  Baseline:50% Goal status: INITIAL     PLAN:   PT FREQUENCY: 2x/week   PT DURATION: 6 weeks   PLANNED INTERVENTIONS: Therapeutic exercises, Therapeutic activity, Patient/Family education, Self Care, Dry Needling, Electrical stimulation, Spinal manipulation, Spinal mobilization, Cryotherapy, Moist heat, Taping, Ionotophoresis 50m/ml Dexamethasone, Manual therapy, and Re-evaluation.   PLAN FOR NEXT SESSION: ; assess response to HEP; progress therex as indicated; use of modalities, manual therapy; and TPDN as indicated.     JHessie Diener PTA 01/06/22 12:55 PM Phone: [phone]: [phone]  PHYSICAL THERAPY DISCHARGE SUMMARY  Visits from Start of Care: 3  Current functional level related to goals / functional outcomes: unknown   Remaining deficits: unknown   Education / Equipment: HEP   Patient agrees to discharge. Patient goals were not met. Patient is being discharged due to not returning since the last visit.   Allen Ralls MS, PT 03/23/22 2:02 PM

## 2022-01-09 ENCOUNTER — Telehealth: Payer: Self-pay | Admitting: Physical Therapy

## 2022-01-09 ENCOUNTER — Ambulatory Visit: Payer: No Typology Code available for payment source | Admitting: Physical Therapy

## 2022-01-09 NOTE — Telephone Encounter (Signed)
Left voice mail regarding no show to appointment this morning. Reminded of next appointment date and time.

## 2022-01-13 ENCOUNTER — Encounter: Payer: Self-pay | Admitting: Physical Therapy

## 2022-01-13 ENCOUNTER — Ambulatory Visit: Payer: No Typology Code available for payment source | Admitting: Physical Therapy

## 2022-01-13 ENCOUNTER — Telehealth: Payer: Self-pay | Admitting: Physical Therapy

## 2022-01-13 NOTE — Telephone Encounter (Signed)
Left voicemail for patient regarding a second consecutive no-show to PT appointment. Left summary of attendance policy: all future appointments are canceled. Please call to schedule if needed.

## 2022-01-16 ENCOUNTER — Ambulatory Visit: Payer: No Typology Code available for payment source | Admitting: Physical Therapy

## 2022-01-20 ENCOUNTER — Encounter: Payer: No Typology Code available for payment source | Admitting: Physical Therapy

## 2022-02-07 ENCOUNTER — Other Ambulatory Visit: Payer: Self-pay | Admitting: Adult Health

## 2022-06-06 ENCOUNTER — Ambulatory Visit (INDEPENDENT_AMBULATORY_CARE_PROVIDER_SITE_OTHER): Payer: 59 | Admitting: Internal Medicine

## 2022-06-06 ENCOUNTER — Encounter: Payer: Self-pay | Admitting: Internal Medicine

## 2022-06-06 VITALS — BP 110/84 | HR 75 | Temp 97.5°F | Ht 65.0 in | Wt 220.0 lb

## 2022-06-06 DIAGNOSIS — R079 Chest pain, unspecified: Secondary | ICD-10-CM | POA: Insufficient documentation

## 2022-06-06 LAB — COMPREHENSIVE METABOLIC PANEL
ALT: 14 U/L (ref 0–35)
AST: 15 U/L (ref 0–37)
Albumin: 4.2 g/dL (ref 3.5–5.2)
Alkaline Phosphatase: 79 U/L (ref 39–117)
BUN: 17 mg/dL (ref 6–23)
CO2: 27 mEq/L (ref 19–32)
Calcium: 9.4 mg/dL (ref 8.4–10.5)
Chloride: 104 mEq/L (ref 96–112)
Creatinine, Ser: 0.8 mg/dL (ref 0.40–1.20)
GFR: 81.66 mL/min (ref >60.00)
Glucose, Bld: 84 mg/dL (ref 70–99)
Potassium: 4.1 mEq/L (ref 3.5–5.1)
Sodium: 138 mEq/L (ref 135–145)
Total Bilirubin: 0.6 mg/dL (ref 0.2–1.2)
Total Protein: 7.5 g/dL (ref 6.0–8.3)

## 2022-06-06 LAB — CBC
HCT: 40.8 % (ref 36.0–46.0)
Hemoglobin: 13.4 g/dL (ref 12.0–15.0)
MCHC: 32.8 g/dL (ref 30.0–36.0)
MCV: 92 fl (ref 78.0–100.0)
Platelets: 353 10*3/uL (ref 150.0–400.0)
RBC: 4.43 Mil/uL (ref 3.87–5.11)
RDW: 14 % (ref 11.5–15.5)
WBC: 8.5 10*3/uL (ref 4.0–10.5)

## 2022-06-06 LAB — TSH: TSH: 3.38 u[IU]/mL (ref 0.35–5.50)

## 2022-06-06 LAB — TROPONIN I (HIGH SENSITIVITY): High Sens Troponin I: 3 ng/L (ref 2–17)

## 2022-06-06 NOTE — Addendum Note (Signed)
Addended by: Tillman Abide I on: 06/06/2022 09:53 AM   Modules accepted: Orders

## 2022-06-06 NOTE — Assessment & Plan Note (Addendum)
Highly suspicious for coronary ischemia--though could be reflux esophagitis as well Will check EKG---sinus at 66. Complete RBBB with repol changes. No change since 2022  Will check labs--including troponin Continue omeprazole daily Will ask for urgent cardiology evaluation Call 911 for recurrent symptoms

## 2022-06-06 NOTE — Progress Notes (Signed)
Subjective:    Patient ID: [name], female    DOB: 08-Apr-1965, 57 y.o.   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     cyclobenzaprine (FLEXERIL) 10 MG tablet Take 0.5-1 tablets (5-10 mg total) by mouth at bedtime as needed for muscle spasms. 30 tablet 2   estradiol (CLIMARA - DOSED IN MG/24 HR) 0.1 mg/24hr patch PLACE 1 PATCH (0.1 MG TOTAL) ONTO THE SKIN ONCE A WEEK. MUST FILL AT [facility] 12 patch 4   Multiple Vitamins-Minerals (MULTI-VITAMIN GUMMIES) CHEW Chew 2 tablets by mouth daily.      omeprazole (PRILOSEC) 20 MG capsule Take 1 capsule (20 mg total) by mouth daily. 30 capsule 3   No current facility-administered medications on file prior to visit.    Allergies  Allergen Reactions   Requip [Ropinirole Hcl]     Intolerant; makes her sleepy   Sulfonamide Derivatives Rash    Past Medical History:  Diagnosis Date   Anemia    Anxiety state, unspecified    GERD (gastroesophageal reflux disease)    Headache(784.0)    Other constipation    S/P hysterectomy with oophorectomy 01/20/2021   SVT (supraventricular tachycardia)     Past Surgical History:  Procedure Laterality Date   CARDIAC CATHETERIZATION  12/09/03   Normal   CERVICAL DISC SURGERY     Ruptured disc repair / and approach c5/6, c6/7; plate   HERNIA REPAIR     umbilical   LAPAROSCOPIC ASSISTED VAGINAL HYSTERECTOMY  N/A 03/09/2015   Procedure: LAPAROSCOPIC ASSISTED VAGINAL HYSTERECTOMY;  Surgeon: Tilda Burrow, MD;  Location: [facility];  Service: Gynecology;  Laterality: N/A;   OPEN REDUCTION INTERNAL FIXATION (ORIF) DISTAL RADIAL FRACTURE Right 03/01/2018   Procedure: OPEN REDUCTION INTERNAL FIXATION (ORIF) DISTAL RADIAL FRACTURE;  Surgeon: Bradly Bienenstock, MD;  Location: [facility];  Service: Orthopedics;  Laterality: Right;   RECTOCELE REPAIR N/A 03/09/2015   Procedure: POSTERIOR REPAIR (RECTOCELE);  Surgeon: Tilda Burrow, MD;  Location: [facility];  Service: Gynecology;  Laterality: N/A;   SALPINGOOPHORECTOMY Bilateral 03/09/2015   Procedure: SALPINGO OOPHORECTOMY;  Surgeon: Tilda Burrow, MD;  Location: [facility];  Service: Gynecology;  Laterality: Bilateral;   TUBAL LIGATION     VAGINAL DELIVERY  90, 97, 99, 03   x 4     Family History  Problem Relation Age of Onset   Deep vein thrombosis Mother        cerebral blood clot    Heart attack Mother    Heart failure Father    Breast cancer Sister    Depression Maternal Uncle    Colon cancer Maternal Uncle    Diabetes Maternal Grandmother    Stroke Maternal Grandmother    Cancer Maternal Grandfather        Prostate    Hypertension Neg Hx    Arthritis Neg Hx     Social History   Socioeconomic History   Marital  status: Married    Spouse name: Jeri Modena   Number of children: 4   Years of education: 14   Highest education level: Not on file  Occupational History   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   Married 1995   Patient has four children.   Patient works full-time.   Patient has a college education.   Patient is right-handed.   Patient drinks caffeine- once or twice a week.     Social Determinants of Health   Financial Resource Strain: Low Risk  (09/25/2019)   Overall Financial Resource Strain (CARDIA)    Difficulty of Paying Living Expenses: Not hard at all  Food Insecurity: No Food Insecurity (10/23/2019)   Hunger Vital Sign    Worried About Running Out of Food in the Last Year: Never true    Ran Out of Food in the Last Year: Never true  Transportation Needs: No Transportation Needs (10/23/2019)   PRAPARE - Administrator, Civil Service (Medical): No    Lack of Transportation (Non-Medical): No  Physical Activity: Insufficiently Active (10/23/2019)   Exercise Vital Sign    Days of Exercise per Week: 2 days    Minutes of Exercise per Session: 20 min  Stress: Stress Concern Present (10/23/2019)   [employer] of Occupational Health - Occupational Stress Questionnaire    Feeling of Stress : To some extent  Social Connections: Moderately Integrated (10/23/2019)   Social Connection and Isolation Panel [NHANES]    Frequency of Communication with Friends and Family: Once a week    Frequency of Social Gatherings with Friends and Family: Once a week    Attends Religious Services: More than 4 times per year    Active Member of [employer] or Organizations: Yes    Attends Engineer, structural: More than 4 times per year    Marital Status: Married  Catering manager Violence: Not At Risk (10/23/2019)   Humiliation, Afraid, Rape, and Kick questionnaire    Fear of Current or Ex-Partner: No    Emotionally Abused: No    Physically Abused: No    Sexually Abused: No   Review of Systems 2 distant stress tests were okay---?15 years ago? Sleeps okay in general No illness---no fever or cough    Objective:   Physical  Exam Constitutional:      Appearance: Normal appearance.  Cardiovascular:     Rate and Rhythm: Normal rate and regular rhythm.     Pulses: Normal pulses.     Heart sounds: No murmur heard.    No gallop.  Pulmonary:     Effort: Pulmonary effort is normal.     Breath sounds: Normal breath sounds. No wheezing or rales.  Abdominal:     Palpations: Abdomen is soft.     Tenderness: There is no abdominal tenderness.  Musculoskeletal:     Cervical back: Neck  supple.     Right lower leg: No edema.     Left lower leg: No edema.  Lymphadenopathy:     Cervical: No cervical adenopathy.  Neurological:     Mental Status: She is alert.  Psychiatric:        Mood and Affect: Mood normal.        Behavior: Behavior normal.            Assessment & Plan:

## 2022-06-08 ENCOUNTER — Ambulatory Visit: Payer: 59 | Admitting: Family Medicine

## 2022-06-13 ENCOUNTER — Ambulatory Visit (INDEPENDENT_AMBULATORY_CARE_PROVIDER_SITE_OTHER): Payer: 59 | Admitting: Family Medicine

## 2022-06-13 ENCOUNTER — Telehealth: Payer: Self-pay | Admitting: Family Medicine

## 2022-06-13 ENCOUNTER — Encounter: Payer: Self-pay | Admitting: Family Medicine

## 2022-06-13 VITALS — BP 110/80 | HR 73 | Temp 97.7°F | Ht 65.0 in | Wt 217.0 lb

## 2022-06-13 DIAGNOSIS — J0100 Acute maxillary sinusitis, unspecified: Secondary | ICD-10-CM

## 2022-06-13 MED ORDER — AMOXICILLIN-POT CLAVULANATE 875-125 MG PO TABS
1.0000 | ORAL_TABLET | Freq: Two times a day (BID) | ORAL | 0 refills | Status: DC
Start: 2022-06-13 — End: 2022-07-13

## 2022-06-13 NOTE — Telephone Encounter (Signed)
Patient notified and already picked letter up.

## 2022-06-13 NOTE — Telephone Encounter (Signed)
Letter done and up front for patient.

## 2022-06-13 NOTE — Telephone Encounter (Signed)
Patient called in and stated that she will like to go ahead and take off tonight from work. She stated that she is at the pharmacy now and can come back by real quick to get the letter. Informed her that the letter may not be ready that soon. Thank you!

## 2022-06-13 NOTE — Progress Notes (Signed)
She had prev eval for chest burning and DOE.  She had cards eval pending.  She had chest congestion at the time of last OV.  She then had other sx after that eval.  More chest congestion, then had fever, body aches since Friday. Has been taking ibuprofen, nyquil, dayquil and sudafed. Was covid tested yesterday at health at work and was negative.   Temp up to 101.  No cough.  Chest feels tight.  She feels worse when meds wear off.  No ST now.  Prev ST last week, resolved in the meantime.  Rhinorrhea, bloody.  Frontal and maxillary pain B.  Ears ringing.  No vomiting, no diarrhea.    Meds, vitals, and allergies reviewed.   ROS: Per HPI unless specifically indicated in ROS section   GEN: nad, alert and oriented HEENT: MMM, OP wnl, nasal exam stuffy, TM wnl B, sinuses ttp x4.   NECK: supple w/o LA CV: rrr PULM: ctab, no inc wob ABD: soft, +bs EXT: no edema SKIN: well perfused.

## 2022-06-13 NOTE — Patient Instructions (Addendum)
See if you can get on the cancellation list with cardiology.   Take care.  Glad to see you. Likely sinus infection, start augmentin, rest and fluids.  Update [place] as needed.

## 2022-06-14 NOTE — Assessment & Plan Note (Signed)
Okay for outpatient follow-up. Work note done. Likely sinus infection, start augmentin, rest and fluids.  Update [place] as needed.  She agrees to plan.

## 2022-07-02 ENCOUNTER — Other Ambulatory Visit: Payer: Self-pay | Admitting: Adult Health

## 2022-07-12 ENCOUNTER — Telehealth: Payer: 59 | Admitting: Nurse Practitioner

## 2022-07-12 DIAGNOSIS — R052 Subacute cough: Secondary | ICD-10-CM

## 2022-07-12 NOTE — Progress Notes (Signed)
[name],  After reviewing your chart and with the recent concerns with your cardiac/heart symptoms we would like you to follow up with your primary care office for this.   I feel your condition warrants further evaluation and I recommend that you be seen for a face to face visit.  Please contact your primary care physician practice to be seen. Many offices offer virtual options to be seen via video if you are not comfortable going in person to a medical facility at this time.  NOTE: You will NOT be charged for this eVisit.  If you do not have a PCP, [facility] offers a free physician referral service available at [phone]. Our trained staff has the experience, knowledge and resources to put you in touch with a physician who is right for you.    If you are having a true medical emergency please call 911.   Your e-visit answers were reviewed by a board certified advanced clinical practitioner to complete your personal care plan.  Thank you for using e-Visits.

## 2022-07-13 ENCOUNTER — Encounter: Payer: Self-pay | Admitting: Internal Medicine

## 2022-07-13 ENCOUNTER — Ambulatory Visit (INDEPENDENT_AMBULATORY_CARE_PROVIDER_SITE_OTHER): Payer: 59 | Admitting: Internal Medicine

## 2022-07-13 VITALS — BP 130/76 | HR 96 | Temp 97.4°F | Ht 65.0 in | Wt 220.0 lb

## 2022-07-13 DIAGNOSIS — J0100 Acute maxillary sinusitis, unspecified: Secondary | ICD-10-CM | POA: Diagnosis not present

## 2022-07-13 MED ORDER — FLUTICASONE PROPIONATE 50 MCG/ACT NA SUSP: 2.0000 | Freq: Every day | NASAL | 1 refills | Status: DC

## 2022-07-13 MED ORDER — DOXYCYCLINE HYCLATE 100 MG PO TABS
100.0000 mg | ORAL_TABLET | Freq: Two times a day (BID) | ORAL | 1 refills | Status: DC
Start: 2022-07-13 — End: 2022-12-06

## 2022-07-13 NOTE — Assessment & Plan Note (Signed)
Responded to augmentin last month but then recurred Discussed nasal steroids  Analgesics Will try doxycycline 100 bid (1 week with refill)

## 2022-07-13 NOTE — Progress Notes (Signed)
Subjective:    Patient ID: [name], female    DOB: 04/18/1965, 57 y.o.   MRN: [id number]  HPI Here due to respiratory infection  Off and on respiratory symptoms go back to last month Rx for sinus infection---but symptoms came back several days after stopping antibiotic Sore throat recurred ~9 days ago Nasal congestion, sinus headache Then started chills Low grade fever No SOB other than the nasal congestion Some mouth and gum tenderness  Seen for chest pressure 5/7--but then felt sick and thought that she had infection 5/14--diagnosed with sinusitis and Rx with augmentin for 10 days  Current Outpatient Medications on File Prior to Visit  Medication Sig Dispense Refill   Cholecalciferol (VITAMIN D3) 50 MCG (2000 UT) TABS Take 4,000 Units by mouth daily.     cyclobenzaprine (FLEXERIL) 10 MG tablet Take 0.5-1 tablets (5-10 mg total) by mouth at bedtime as needed for muscle spasms. 30 tablet 2   estradiol (CLIMARA - DOSED IN MG/24 HR) 0.1 mg/24hr patch PLACE 1 PATCH (0.1 MG TOTAL) ONTO THE SKIN ONCE A WEEK 12 patch 3   Multiple Vitamins-Minerals (MULTI-VITAMIN GUMMIES) CHEW Chew 2 tablets by mouth daily.      omeprazole (PRILOSEC) 20 MG capsule Take 1 capsule (20 mg total) by mouth daily. 30 capsule 3   No current facility-administered medications on file prior to visit.    Allergies  Allergen Reactions   Requip [Ropinirole Hcl]     Intolerant; makes her sleepy   Sulfonamide Derivatives Rash    Past Medical History:  Diagnosis Date   Anemia    Anxiety state, unspecified    GERD (gastroesophageal reflux disease)    Headache(784.0)    Other constipation    S/P hysterectomy with oophorectomy 01/20/2021   SVT (supraventricular tachycardia)     Past Surgical History:  Procedure Laterality Date   CARDIAC CATHETERIZATION  12/09/03   Normal   CERVICAL DISC SURGERY     Ruptured disc repair / and approach c5/6, c6/7; plate   HERNIA REPAIR     umbilical    LAPAROSCOPIC ASSISTED VAGINAL HYSTERECTOMY N/A 03/09/2015   Procedure: LAPAROSCOPIC ASSISTED VAGINAL HYSTERECTOMY;  Surgeon: Tilda Burrow, MD;  Location: [facility];  Service: Gynecology;  Laterality: N/A;   OPEN REDUCTION INTERNAL FIXATION (ORIF) DISTAL RADIAL FRACTURE Right 03/01/2018   Procedure: OPEN REDUCTION INTERNAL FIXATION (ORIF) DISTAL RADIAL FRACTURE;  Surgeon: Bradly Bienenstock, MD;  Location: [facility];  Service: Orthopedics;  Laterality: Right;   RECTOCELE REPAIR N/A 03/09/2015   Procedure: POSTERIOR REPAIR (RECTOCELE);  Surgeon: Tilda Burrow, MD;  Location: [facility];  Service: Gynecology;  Laterality: N/A;   SALPINGOOPHORECTOMY Bilateral 03/09/2015   Procedure: SALPINGO OOPHORECTOMY;  Surgeon: Tilda Burrow, MD;  Location: [facility];  Service: Gynecology;  Laterality: Bilateral;   TUBAL LIGATION     VAGINAL DELIVERY  90, 97, 99, 03   x 4     Family History  Problem Relation Age of Onset   Deep vein thrombosis Mother        cerebral blood clot    Heart attack Mother    Heart failure Father    Breast cancer Sister    Depression Maternal Uncle    Colon cancer Maternal Uncle    Diabetes Maternal Grandmother    Stroke Maternal Grandmother    Cancer Maternal Grandfather        Prostate    Hypertension Neg Hx    Arthritis Neg Hx     Social History  Socioeconomic History   Marital status: Married    Spouse name: Jeri Modena   Number of children: 4   Years of education: 14   Highest education level: Not on file  Occupational History   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   Married 1995   Patient has four children.   Patient works full-time.   Patient has a college education.   Patient is right-handed.   Patient drinks caffeine- once or twice a week.     Social Determinants of Health   Financial Resource Strain: Low Risk  (09/25/2019)   Overall Financial Resource Strain (CARDIA)    Difficulty of Paying Living Expenses: Not hard at all  Food Insecurity: No Food Insecurity (10/23/2019)   Hunger Vital Sign    Worried About Running Out of Food in the Last Year: Never true    Ran Out of Food in the Last Year: Never true  Transportation Needs: No Transportation Needs (10/23/2019)   PRAPARE - Administrator, Civil Service (Medical): No    Lack of Transportation (Non-Medical): No  Physical Activity: Insufficiently Active (10/23/2019)   Exercise Vital Sign    Days of Exercise per Week: 2 days    Minutes of Exercise per Session: 20 min  Stress: Stress Concern Present (10/23/2019)   [employer] of Occupational Health - Occupational Stress Questionnaire    Feeling of Stress : To some extent  Social Connections: Moderately Integrated (10/23/2019)   Social Connection and Isolation Panel [NHANES]    Frequency of Communication with Friends and Family: Once a week    Frequency of Social Gatherings with Friends and Family: Once a week    Attends Religious Services: More than 4 times per year    Active Member of [employer] or Organizations: Yes    Attends Engineer, structural: More than 4 times per year    Marital Status: Married  Catering manager Violence: Not At Risk (10/23/2019)   Humiliation, Afraid, Rape, and Kick questionnaire    Fear of Current or Ex-Partner: No    Emotionally Abused: No    Physically Abused: No    Sexually Abused: No   Review of Systems No N/V Appetite is some better now Home COVID test negative     Objective:   Physical  Exam Constitutional:      Appearance: Normal appearance.  HENT:     Head:     Comments: Frontal and maxillary tenderness    Right Ear: Tympanic membrane normal.     Ears:     Comments: Slight retraction left TM---no inflammation    Mouth/Throat:     Pharynx: No oropharyngeal exudate or posterior oropharyngeal erythema.  Pulmonary:     Effort: Pulmonary effort is normal.     Breath sounds: Normal breath sounds. No wheezing or rales.  Musculoskeletal:     Cervical back: Neck  supple.  Lymphadenopathy:     Cervical: No cervical adenopathy.  Neurological:     Mental Status: She is alert.            Assessment & Plan:

## 2022-08-04 ENCOUNTER — Other Ambulatory Visit: Payer: Self-pay | Admitting: Internal Medicine

## 2022-08-16 NOTE — Progress Notes (Signed)
Referring-Richard Alphonsus Sias MD Reason for referral-chest pain  HPI: 57 year old female for evaluation of chest pain at request of Tillman Abide MD.  Patient seen previously but not since 2019.  Echocardiogram 2008 showed normal LV function.  Laboratories May 2024 showed hemoglobin 13.4, normal troponin, sodium 138, potassium 4.1, creatinine 0.80, normal liver functions.  TSH 3.38.  Patient has had intermittent chest pain in the past.  Was seen May 2024 with this complaint and referred to cardiology.  Patient states she has had a sinus infection intermittently for 3 months.  She has had associated dyspnea on exertion but there is no orthopnea, PND or pedal edema.  She has some chest discomfort in the substernal and right chest area.  It can last several minutes and resolve spontaneously.  Not exertional and no associated symptoms.  No radiation.  Resolved spontaneously.  She also describes some pain in her legs bilaterally with ambulation.  Cardiology now asked to evaluate.  Current Outpatient Medications  Medication Sig Dispense Refill   Cholecalciferol (VITAMIN D3) 50 MCG (2000 UT) TABS Take 4,000 Units by mouth daily.     cyclobenzaprine (FLEXERIL) 10 MG tablet Take 0.5-1 tablets (5-10 mg total) by mouth at bedtime as needed for muscle spasms. 30 tablet 2   estradiol (CLIMARA - DOSED IN MG/24 HR) 0.1 mg/24hr patch PLACE 1 PATCH (0.1 MG TOTAL) ONTO THE SKIN ONCE A WEEK 12 patch 3   fluticasone (FLONASE) 50 MCG/ACT nasal spray PLACE 2 SPRAYS INTO BOTH NOSTRILS DAILY. IN EACH NOSTRIL 48 mL 1   Multiple Vitamins-Minerals (MULTI-VITAMIN GUMMIES) CHEW Chew 2 tablets by mouth daily.      omeprazole (PRILOSEC) 20 MG capsule Take 1 capsule (20 mg total) by mouth daily. 30 capsule 3   doxycycline (VIBRA-TABS) 100 MG tablet Take 1 tablet (100 mg total) by mouth 2 (two) times daily. (Patient not taking: Reported on 08/24/2022) 14 tablet 1   No current facility-administered medications for this visit.     Allergies  Allergen Reactions   Requip [Ropinirole Hcl]     Intolerant; makes her sleepy   Sulfonamide Derivatives Rash     Past Medical History:  Diagnosis Date   Anemia    Anxiety state, unspecified    GERD (gastroesophageal reflux disease)    Headache(784.0)    Other constipation    S/P hysterectomy with oophorectomy 01/20/2021   SVT (supraventricular tachycardia)     Past Surgical History:  Procedure Laterality Date   CARDIAC CATHETERIZATION  12/09/03   Normal   CERVICAL DISC SURGERY     Ruptured disc repair / and approach c5/6, c6/7; plate   HERNIA REPAIR     umbilical   LAPAROSCOPIC ASSISTED VAGINAL HYSTERECTOMY N/A 03/09/2015   Procedure: LAPAROSCOPIC ASSISTED VAGINAL HYSTERECTOMY;  Surgeon: Tilda Burrow, MD;  Location: [facility];  Service: Gynecology;  Laterality: N/A;   OPEN REDUCTION INTERNAL FIXATION (ORIF) DISTAL RADIAL FRACTURE Right 03/01/2018   Procedure: OPEN REDUCTION INTERNAL FIXATION (ORIF) DISTAL RADIAL FRACTURE;  Surgeon: Bradly Bienenstock, MD;  Location[facility];  Service: Orthopedics;  Laterality: Right;   RECTOCELE REPAIR N/A 03/09/2015   Procedure: POSTERIOR REPAIR (RECTOCELE);  Surgeon: Tilda Burrow, MD;  Location: [facility];  Service: Gynecology;  Laterality: N/A;   SALPINGOOPHORECTOMY Bilateral 03/09/2015   Procedure: SALPINGO OOPHORECTOMY;  Surgeon: Tilda Burrow, MD;  Location: [facility];  Service: Gynecology;  Laterality: Bilateral;   TUBAL LIGATION     VAGINAL DELIVERY  90, 97, 99, 03   x  4     Social History   Socioeconomic History   Marital status: Married    Spouse name: Jeri Modena   Number of children: 4   Years of education: 14   Highest education level: Not on file  Occupational History   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   Married 1995   Patient has four children.   Patient works full-time.   Patient has a college education.   Patient is right-handed.   Patient drinks caffeine- once or twice a week.     Social Determinants of Health   Financial Resource Strain: Low Risk  (09/25/2019)   Overall Financial Resource Strain (CARDIA)    Difficulty of Paying Living Expenses: Not hard at all  Food Insecurity: No Food Insecurity (10/23/2019)   Hunger Vital Sign    Worried About Running Out of Food in the Last Year: Never true    Ran Out of Food in the Last Year: Never true  Transportation Needs: No Transportation Needs (10/23/2019)   PRAPARE - Administrator, Civil Service (Medical): No    Lack of Transportation (Non-Medical): No  Physical Activity: Insufficiently Active (10/23/2019)   Exercise Vital Sign    Days of Exercise per Week: 2 days    Minutes of Exercise per Session: 20 min  Stress: Stress Concern Present (10/23/2019)   [employer] of Occupational Health - Occupational Stress Questionnaire    Feeling of Stress : To some extent  Social Connections: Moderately Integrated (10/23/2019)   Social Connection and Isolation Panel [NHANES]    Frequency of Communication with Friends and Family: Once a week    Frequency of Social Gatherings with Friends and Family: Once a week    Attends Religious Services: More than 4 times per year    Active Member of [employer] or Organizations: Yes    Attends Engineer, structural: More than 4 times per year    Marital Status: Married  Catering manager Violence: Not At Risk (10/23/2019)   Humiliation, Afraid, Rape, and Kick questionnaire    Fear of  Current or Ex-Partner: No    Emotionally Abused: No    Physically Abused: No    Sexually Abused: No    Family History  Problem Relation Age of Onset   Deep vein thrombosis Mother        cerebral blood clot    Heart attack Mother    Heart failure Father    Breast cancer Sister    Depression Maternal Uncle    Colon cancer Maternal Uncle    Diabetes Maternal Grandmother    Stroke Maternal Grandmother    Cancer Maternal Grandfather        Prostate    Hypertension Neg Hx    Arthritis Neg Hx     ROS: no fevers or chills, productive cough, hemoptysis, dysphasia, odynophagia, melena, hematochezia, dysuria,  hematuria, rash, seizure activity, orthopnea, PND, pedal edema, claudication. Remaining systems are negative.  Physical Exam:   Blood pressure 124/74, pulse 70, height 5\' 5"  (1.651 m), weight 216 lb (98 kg), last menstrual period 01/31/2015, SpO2 99%.  General:  Well developed/well nourished in NAD Skin warm/dry Patient not depressed No peripheral clubbing Back-normal HEENT-normal/normal eyelids Neck supple/normal carotid upstroke bilaterally; no bruits; no JVD; no thyromegaly chest - CTA/ normal expansion CV - RRR/normal S1 and S2; no murmurs, rubs or gallops;  PMI nondisplaced Abdomen -NT/ND, no HSM, no mass, + bowel sounds, no bruit 2+ femoral pulses, no bruits Ext-no edema, chords, 2+ DP Neuro-grossly nonfocal  ECG -Jun 06, 2022-normal sinus rhythm with right bundle branch block.  Personally reviewed  EKG Interpretation Date/Time:  Thursday August 24 2022 16:01:11 EDT Ventricular Rate:  78 PR Interval:  154 QRS Duration:  144 QT Interval:  408 QTC Calculation: 465 R Axis:   -2  Text Interpretation: Normal sinus rhythm with sinus arrhythmia Right bundle branch block When compared with ECG of 26-Jul-2020 07:05, PREVIOUS ECG IS PRESENT Confirmed by Olga Millers ([id number]) on 08/24/2022 4:01:50 PM    A/P  1 chest pain-symptoms are atypical.  She is concerned about  these.  We will arrange a cardiac CTA to rule out obstructive coronary disease.  2 bilateral leg pain-some pain with ambulation.  Will arrange ABIs with Doppler.  Olga Millers, MD

## 2022-08-24 ENCOUNTER — Ambulatory Visit: Payer: 59 | Admitting: Cardiology

## 2022-08-24 ENCOUNTER — Encounter: Payer: Self-pay | Admitting: Cardiology

## 2022-08-24 VITALS — BP 124/74 | HR 70 | Ht 65.0 in | Wt 216.0 lb

## 2022-08-24 DIAGNOSIS — M79605 Pain in left leg: Secondary | ICD-10-CM | POA: Diagnosis not present

## 2022-08-24 DIAGNOSIS — M79604 Pain in right leg: Secondary | ICD-10-CM | POA: Diagnosis not present

## 2022-08-24 DIAGNOSIS — R072 Precordial pain: Secondary | ICD-10-CM | POA: Diagnosis not present

## 2022-08-24 MED ORDER — METOPROLOL TARTRATE 100 MG PO TABS: ORAL_TABLET | ORAL | 0 refills | Status: DC

## 2022-08-24 NOTE — Patient Instructions (Signed)
Testing/Procedures: Your physician has requested that you have an ankle brachial index (ABI). During this test an ultrasound and blood pressure cuff are used to evaluate the arteries that supply the arms and legs with blood. Allow thirty minutes for this exam. There are no restrictions or special instructions. [facility]     Your cardiac CT will be scheduled at   [facility] [address] [place], [place] [address] [phone]    If scheduled at [facility], please arrive at the [facility] and Children's Entrance (Entrance C2) of [facility] 30 minutes prior to test start time. You can use the FREE valet parking offered at entrance C (encouraged to control the heart rate for the test)  Proceed to the [facility] Radiology Department (first floor) to check-in and test prep.  All radiology patients and guests should use entrance C2 at [facility], accessed from [facility], even though the hospital's physical address listed is [address].       Please follow these instructions carefully (unless otherwise directed):  An IV will be required for this test and Nitroglycerin will be given.    On the Night Before the Test: Be sure to Drink plenty of water. Do not consume any caffeinated/decaffeinated beverages or chocolate 12 hours prior to your test. Do not take any antihistamines 12 hours prior to your test.   On the Day of the Test: Drink plenty of water until 1 hour prior to the test. Do not eat any food 1 hour prior to test. You may take your regular medications prior to the test.  Take metoprolol (Lopressor) 100 MG two hours prior to test. FEMALES- please wear underwire-free bra if available, avoid dresses & tight clothing      After the Test: Drink plenty of water. After receiving IV contrast, you may experience a mild flushed feeling. This is normal. On occasion, you may experience a mild rash up to  24 hours after the test. This is not dangerous. If this occurs, you can take Benadryl 25 mg and increase your fluid intake. If you experience trouble breathing, this can be serious. If it is severe call 911 IMMEDIATELY. If it is mild, please call our office.   We will call to schedule your test 2-4 weeks out understanding that some insurance companies will need an authorization prior to the service being performed.   For more information and frequently asked questions, please visit our website : [email or web address]  For non-scheduling related questions, please contact the cardiac imaging nurse navigator should you have any questions/concerns: Cardiac Imaging Nurse Navigators Direct Office Dial: [phone]   For scheduling needs, including cancellations and rescheduling, please call [place], [phone].    Follow-Up: At [facility], you and your health needs are our priority.  As part of our continuing mission to provide you with exceptional heart care, we have created designated Provider Care Teams.  These Care Teams include your primary Cardiologist (physician) and Advanced Practice Providers (APPs -  Physician Assistants and Nurse Practitioners) who all work together to provide you with the care you need, when you need it.  We recommend signing up for the patient portal called "MyChart".  Sign up information is provided on this After Visit Summary.  MyChart is used to connect with patients for Virtual Visits (Telemedicine).  Patients are able to view lab/test results, encounter notes, upcoming appointments, etc.  Non-urgent messages can be sent to your provider as  well.   To learn more about what you can do with MyChart, go to [email or web address].    Your next appointment:   AS NEEDED

## 2022-08-25 ENCOUNTER — Other Ambulatory Visit (HOSPITAL_COMMUNITY): Payer: Self-pay | Admitting: Cardiology

## 2022-08-25 DIAGNOSIS — I739 Peripheral vascular disease, unspecified: Secondary | ICD-10-CM

## 2022-09-08 ENCOUNTER — Ambulatory Visit (HOSPITAL_COMMUNITY)
Admission: RE | Admit: 2022-09-08 | Discharge: 2022-09-08 | Disposition: A | Discharge status: Home / Self Care | Payer: 59 | Source: Ambulatory Visit | Attending: Cardiology | Admitting: Cardiology

## 2022-09-08 DIAGNOSIS — M79604 Pain in right leg: Secondary | ICD-10-CM | POA: Insufficient documentation

## 2022-09-08 DIAGNOSIS — M79605 Pain in left leg: Secondary | ICD-10-CM | POA: Diagnosis not present

## 2022-09-08 LAB — VAS US ABI WITH/WO TBI
Left ABI: 1.22
Right ABI: 1.21

## 2022-09-14 ENCOUNTER — Encounter: Payer: Self-pay | Admitting: *Deleted

## 2022-09-22 ENCOUNTER — Emergency Department (HOSPITAL_COMMUNITY)
Admission: EM | Admit: 2022-09-22 | Discharge: 2022-09-22 | Disposition: A | Discharge status: Home / Self Care | Payer: 59 | Attending: Emergency Medicine | Admitting: Emergency Medicine

## 2022-09-22 ENCOUNTER — Other Ambulatory Visit: Payer: Self-pay

## 2022-09-22 ENCOUNTER — Encounter (HOSPITAL_COMMUNITY): Payer: Self-pay | Admitting: *Deleted

## 2022-09-22 ENCOUNTER — Emergency Department (HOSPITAL_COMMUNITY): Payer: 59

## 2022-09-22 DIAGNOSIS — I4510 Unspecified right bundle-branch block: Secondary | ICD-10-CM | POA: Diagnosis not present

## 2022-09-22 DIAGNOSIS — E860 Dehydration: Secondary | ICD-10-CM | POA: Diagnosis not present

## 2022-09-22 DIAGNOSIS — M79602 Pain in left arm: Secondary | ICD-10-CM | POA: Diagnosis not present

## 2022-09-22 DIAGNOSIS — R1900 Intra-abdominal and pelvic swelling, mass and lump, unspecified site: Secondary | ICD-10-CM | POA: Diagnosis not present

## 2022-09-22 DIAGNOSIS — R110 Nausea: Secondary | ICD-10-CM | POA: Diagnosis not present

## 2022-09-22 DIAGNOSIS — R0602 Shortness of breath: Secondary | ICD-10-CM | POA: Insufficient documentation

## 2022-09-22 DIAGNOSIS — R531 Weakness: Secondary | ICD-10-CM | POA: Diagnosis not present

## 2022-09-22 DIAGNOSIS — M79603 Pain in arm, unspecified: Secondary | ICD-10-CM | POA: Diagnosis not present

## 2022-09-22 DIAGNOSIS — K8020 Calculus of gallbladder without cholecystitis without obstruction: Secondary | ICD-10-CM | POA: Diagnosis not present

## 2022-09-22 DIAGNOSIS — R109 Unspecified abdominal pain: Secondary | ICD-10-CM | POA: Diagnosis not present

## 2022-09-22 LAB — HEPATIC FUNCTION PANEL
ALT: 17 U/L (ref 0–44)
AST: 24 U/L (ref 15–41)
Albumin: 3.6 g/dL (ref 3.5–5.0)
Alkaline Phosphatase: 77 U/L (ref 38–126)
Bilirubin, Direct: 0.2 mg/dL (ref 0.0–0.2)
Indirect Bilirubin: 0.3 mg/dL (ref 0.3–0.9)
Total Bilirubin: 0.5 mg/dL (ref 0.3–1.2)
Total Protein: 7.2 g/dL (ref 6.5–8.1)

## 2022-09-22 LAB — BASIC METABOLIC PANEL
Anion gap: 17 — ABNORMAL HIGH (ref 5–15)
Anion gap: 11 (ref 5–15)
BUN: 11 mg/dL (ref 6–20)
BUN: 8 mg/dL (ref 6–20)
CO2: 15 mmol/L — ABNORMAL LOW (ref 22–32)
CO2: 20 mmol/L — ABNORMAL LOW (ref 22–32)
Calcium: 8.8 mg/dL — ABNORMAL LOW (ref 8.9–10.3)
Calcium: 8.8 mg/dL — ABNORMAL LOW (ref 8.9–10.3)
Chloride: 103 mmol/L (ref 98–111)
Chloride: 108 mmol/L (ref 98–111)
Creatinine, Ser: 0.97 mg/dL (ref 0.44–1.00)
Creatinine, Ser: 0.69 mg/dL (ref 0.44–1.00)
GFR, Estimated: >60 mL/min (ref >60)
GFR, Estimated: >60 mL/min (ref >60)
Glucose, Bld: 143 mg/dL — ABNORMAL HIGH (ref 70–99)
Glucose, Bld: 93 mg/dL (ref 70–99)
Potassium: 3 mmol/L — ABNORMAL LOW (ref 3.5–5.1)
Potassium: 4.1 mmol/L (ref 3.5–5.1)
Sodium: 135 mmol/L (ref 135–145)
Sodium: 139 mmol/L (ref 135–145)

## 2022-09-22 LAB — URINALYSIS, ROUTINE W REFLEX MICROSCOPIC
Bilirubin Urine: NEGATIVE
Glucose, UA: NEGATIVE mg/dL
Ketones, ur: NEGATIVE mg/dL
Leukocytes,Ua: NEGATIVE
Nitrite: NEGATIVE
Protein, ur: NEGATIVE mg/dL
Specific Gravity, Urine: 1.004 — ABNORMAL LOW (ref 1.005–1.030)
pH: 7 (ref 5.0–8.0)

## 2022-09-22 LAB — CBC
HCT: 41.4 % (ref 36.0–46.0)
Hemoglobin: 13.8 g/dL (ref 12.0–15.0)
MCH: 30.3 pg (ref 26.0–34.0)
MCHC: 33.3 g/dL (ref 30.0–36.0)
MCV: 90.8 fL (ref 80.0–100.0)
Platelets: 397 10*3/uL (ref 150–400)
RBC: 4.56 MIL/uL (ref 3.87–5.11)
RDW: 14 % (ref 11.5–15.5)
WBC: 8.5 10*3/uL (ref 4.0–10.5)
nRBC: 0 % (ref 0.0–0.2)

## 2022-09-22 LAB — ETHANOL: Alcohol, Ethyl (B): <10 mg/dL (ref <10)

## 2022-09-22 LAB — LACTIC ACID, PLASMA
Lactic Acid, Venous: 2.3 mmol/L (ref 0.5–1.9)
Lactic Acid, Venous: 1.2 mmol/L (ref 0.5–1.9)

## 2022-09-22 LAB — TROPONIN I (HIGH SENSITIVITY)
Troponin I (High Sensitivity): 5 ng/L (ref <18)
Troponin I (High Sensitivity): 4 ng/L (ref <18)

## 2022-09-22 LAB — LIPASE, BLOOD: Lipase: 28 U/L (ref 11–51)

## 2022-09-22 LAB — CBG MONITORING, ED: Glucose-Capillary: 148 mg/dL — ABNORMAL HIGH (ref 70–99)

## 2022-09-22 LAB — MAGNESIUM: Magnesium: 1.9 mg/dL (ref 1.7–2.4)

## 2022-09-22 LAB — SALICYLATE LEVEL: Salicylate Lvl: <7 mg/dL — ABNORMAL LOW (ref 7.0–30.0)

## 2022-09-22 LAB — BRAIN NATRIURETIC PEPTIDE: B Natriuretic Peptide: 23.6 pg/mL (ref 0.0–100.0)

## 2022-09-22 MED ORDER — IOHEXOL 350 MG/ML SOLN
100.0000 mL | Freq: Once | INTRAVENOUS | Status: AC | PRN
  Administered 2022-09-22: 100 mL via INTRAVENOUS

## 2022-09-22 MED ORDER — POTASSIUM CHLORIDE CRYS ER 20 MEQ PO TBCR: 40.0000 meq | EXTENDED_RELEASE_TABLET | Freq: Once | ORAL | Status: DC

## 2022-09-22 MED ORDER — LACTATED RINGERS IV BOLUS
1000.0000 mL | Freq: Once | INTRAVENOUS | Status: AC
  Administered 2022-09-22: 1000 mL via INTRAVENOUS

## 2022-09-22 MED ORDER — POTASSIUM CHLORIDE 20 MEQ PO PACK
40.0000 meq | PACK | Freq: Once | ORAL | Status: AC
  Administered 2022-09-22: 40 meq via ORAL
  Filled 2022-09-22: qty 2

## 2022-09-22 MED ORDER — POTASSIUM CHLORIDE 10 MEQ/100ML IV SOLN
10.0000 meq | Freq: Once | INTRAVENOUS | Status: AC
  Administered 2022-09-22: 10 meq via INTRAVENOUS
  Filled 2022-09-22: qty 100

## 2022-09-22 MED ORDER — POTASSIUM CHLORIDE 20 MEQ PO PACK: 40.0000 meq | PACK | Freq: Two times a day (BID) | ORAL | Status: DC

## 2022-09-22 MED ORDER — POTASSIUM CHLORIDE 10 MEQ/100ML IV SOLN: 10.0000 meq | INTRAVENOUS | Status: DC

## 2022-09-22 NOTE — ED Provider Notes (Addendum)
[place] EMERGENCY DEPARTMENT AT [facility] Provider Note   CSN: [id number] Arrival date & time: 09/22/22  1247     History  Chief Complaint  Patient presents with   Weakness    [name] is a 57 y.o. female.  HPI 56 year old female with a history of headaches, anxiety, anemia, GERD, SVT, s/p hysterectomy who presents to the ER with complaints of nausea, fatigue and left arm pain which started this morning.  Patient states that she woke up this morning feeling lightheaded, which improved with laying down.  She thought that she was dehydrated and started to drink some water.  She then developed left arm tingling and numbness, though this is not uncommon for her when she sleeps on her left side.  She does have a history of cervical spine surgery.  She felt progressively more nauseous, but denies any vomiting.  She does state that she has been constipated and thought this was contributing to her nausea.  She also endorsed worsening shortness of breath with ambulation.  Denies any cough, fevers or chills.  No leg swelling.  No recent travel or surgeries.  Denies any history of COPD or asthma.  Drinking alcohol or taking salicylates or aspirin.  She did state that she took 2 ibuprofens earlier today but denies any excessive or chronic use.  Per chart review, she was evaluated by Dr. Jens Som with cardiology and July where she also had endorsed some dyspnea on exertion and chest discomfort.  She had a cardiac CT ordered and ABIs.  ABIs were normal.  I do not see the coronary CT results.    Home Medications Prior to Admission medications   Medication Sig Start Date End Date Taking? Authorizing Provider  Cholecalciferol (VITAMIN D3) 50 MCG (2000 UT) TABS Take 4,000 Units by mouth daily. 06/27/21   Joaquim Nam, MD  cyclobenzaprine (FLEXERIL) 10 MG tablet Take 0.5-1 tablets (5-10 mg total) by mouth at bedtime as needed for muscle spasms. 11/21/21   Copland, Karleen Hampshire, MD   doxycycline (VIBRA-TABS) 100 MG tablet Take 1 tablet (100 mg total) by mouth 2 (two) times daily. Patient not taking: Reported on 08/24/2022 07/13/22   Tillman Abide I, MD  estradiol (CLIMARA - DOSED IN MG/24 HR) 0.1 mg/24hr patch PLACE 1 PATCH (0.1 MG TOTAL) ONTO THE SKIN ONCE A WEEK 07/03/22   Cyril Mourning A, NP  fluticasone (FLONASE) 50 MCG/ACT nasal spray PLACE 2 SPRAYS INTO BOTH NOSTRILS DAILY. IN [facility] NOSTRIL 08/04/22   Joaquim Nam, MD  metoprolol tartrate (LOPRESSOR) 100 MG tablet TAKE 2 HOURS PRIOR TO CT SCAN 08/24/22   Lewayne Bunting, MD  Multiple Vitamins-Minerals (MULTI-VITAMIN GUMMIES) CHEW Chew 2 tablets by mouth daily.  05/30/16   [provider]  omeprazole (PRILOSEC) 20 MG capsule Take 1 capsule (20 mg total) by mouth daily. 06/16/21   Mort Sawyers, FNP      Allergies    Requip [ropinirole hcl] and Sulfonamide derivatives    Review of Systems   Review of Systems Ten systems reviewed and are negative for acute change, except as noted in the HPI.   Physical Exam Updated Vital Signs BP 134/69   Pulse (!) 59   Temp 97.6 F (36.4 C) (Oral)   Resp 17   Ht 5\' 4"  (1.626 m)   Wt 99.3 kg   LMP 01/31/2015   SpO2 100%   BMI 37.59 kg/m  Physical Exam Vitals and nursing note reviewed.  Constitutional:  General: She is not in acute distress.    Appearance: She is well-developed.  HENT:     Head: Normocephalic and atraumatic.  Eyes:     Conjunctiva/sclera: Conjunctivae normal.  Cardiovascular:     Rate and Rhythm: Normal rate and regular rhythm.     Heart sounds: No murmur heard. Pulmonary:     Effort: Pulmonary effort is normal. No respiratory distress.     Breath sounds: Normal breath sounds.  Abdominal:     Palpations: Abdomen is soft.     Tenderness: There is no abdominal tenderness.  Musculoskeletal:        General: No swelling, tenderness or deformity.     Cervical back: Neck supple.  Skin:    General: Skin is warm and dry.     Capillary  Refill: Capillary refill takes less than 2 seconds.  Neurological:     General: No focal deficit present.     Mental Status: She is alert and oriented to person, place, and time.     Comments: Mental Status:  Alert, thought content appropriate, able to give a coherent history. Speech fluent without evidence of aphasia. Able to follow 2 step commands without difficulty.  Cranial Nerves:  II:  Peripheral visual fields grossly normal, pupils equal, round, reactive to light III,IV, VI: ptosis not present, extra-ocular motions intact bilaterally  V,VII: smile symmetric, facial light touch sensation equal VIII: hearing grossly normal to voice  X: uvula elevates symmetrically  XI: bilateral shoulder shrug symmetric and strong XII: midline tongue extension without fassiculations Motor:  Normal tone. 5/5 strength of BUE and BLE major muscle groups including strong and equal grip strength and dorsiflexion/plantar flexion Sensory: light touch normal in all extremities. Cerebellar: normal finger-to-nose with bilateral upper extremities, Romberg sign absent Gait: Not accessed     Psychiatric:        Mood and Affect: Mood normal.     ED Results / Procedures / Treatments   Labs (all labs ordered are listed, but only abnormal results are displayed) Labs Reviewed  BASIC METABOLIC PANEL - Abnormal; Notable for the following components:      Result Value   Potassium 3.0 (*)    CO2 15 (*)    Glucose, Bld 143 (*)    Calcium 8.8 (*)    Anion gap 17 (*)    All other components within normal limits  URINALYSIS, ROUTINE W REFLEX MICROSCOPIC - Abnormal; Notable for the following components:   Color, Urine STRAW (*)    Specific Gravity, Urine 1.004 (*)    Hgb urine dipstick SMALL (*)    Bacteria, UA RARE (*)    All other components within normal limits  SALICYLATE LEVEL - Abnormal; Notable for the following components:   Salicylate Lvl <7.0 (*)    All other components within normal limits   LACTIC ACID, PLASMA - Abnormal; Notable for the following components:   Lactic Acid, Venous 2.3 (*)    All other components within normal limits  BASIC METABOLIC PANEL - Abnormal; Notable for the following components:   CO2 20 (*)    Calcium 8.8 (*)    All other components within normal limits  CBG MONITORING, ED - Abnormal; Notable for the following components:   Glucose-Capillary 148 (*)    All other components within normal limits  CBC  ETHANOL  MAGNESIUM  HEPATIC FUNCTION PANEL  LIPASE, BLOOD  BRAIN NATRIURETIC PEPTIDE  LACTIC ACID, PLASMA  LACTIC ACID, PLASMA  TROPONIN I (HIGH SENSITIVITY)  TROPONIN  I (HIGH SENSITIVITY)    EKG EKG Interpretation Date/Time:  Friday September 22 2022 13:14:15 EDT Ventricular Rate:  92 PR Interval:  150 QRS Duration:  152 QT Interval:  384 QTC Calculation: 474 R Axis:   209  Text Interpretation: Normal sinus rhythm Right bundle branch block Abnormal ECG No significant change since last tracing Confirmed by Melene Plan [phone]) on 09/22/2022 7:19:25 PM  Radiology CT ABDOMEN PELVIS W CONTRAST  Result Date: 09/22/2022 CLINICAL DATA:  Abdominal swelling and pain. EXAM: CT ABDOMEN AND PELVIS WITH CONTRAST TECHNIQUE: Multidetector CT imaging of the abdomen and pelvis was performed using the standard protocol following bolus administration of intravenous contrast. RADIATION DOSE REDUCTION: This exam was performed according to the departmental dose-optimization program which includes automated exposure control, adjustment of the mA and/or kV according to patient size and/or use of iterative reconstruction technique. CONTRAST:  OMNIPAQUE IOHEXOL 350 MG/ML SOLN COMPARISON:  CT abdomen pelvis dated 09/09/2010. FINDINGS: Lower chest: The visualized lung bases are clear. No intra-abdominal free air or free fluid. Hepatobiliary: The liver is unremarkable. No biliary dilatation. The gallbladder is filled with stones. No pericholecystic fluid or evidence of  acute cholecystitis by CT. Ultrasound may provide better evaluation of the gallbladder if there is clinical concern for acute cholecystitis. Pancreas: Unremarkable. No pancreatic ductal dilatation or surrounding inflammatory changes. Spleen: Normal in size without focal abnormality. Adrenals/Urinary Tract: The adrenal glands are unremarkable. There is no hydronephrosis on either side. There is symmetric enhancement and excretion of contrast by both kidneys. The visualized ureters and the urinary bladder appear unremarkable. Stomach/Bowel: Loose stool throughout the colon in keeping with diarrheal state. Correlation with clinical exam and stool cultures recommended. There is no bowel obstruction or active inflammation. The appendix is normal. Vascular/Lymphatic: The abdominal aorta and IVC are unremarkable. No portal venous gas. There is no adenopathy. Reproductive: Hysterectomy.  No adnexal masses. Other: None Musculoskeletal: Degenerative changes of the spine and lower lumbar facet arthropathy. No acute osseous pathology. IMPRESSION: 1. Diarrheal state. Correlation with clinical exam and stool cultures recommended. No bowel obstruction. Normal appendix. 2. Cholelithiasis. Electronically Signed   By: Elgie Collard M.D.   On: 09/22/2022 20:24   DG Chest Portable 1 View  Result Date: 09/22/2022 CLINICAL DATA:  Nausea.  Arm pain EXAM: PORTABLE CHEST 1 VIEW COMPARISON:  X-ray 07/26/2020 FINDINGS: The heart size and mediastinal contours are within normal limits. No consolidation, pneumothorax or effusion. No edema. Normal cardiopericardial silhouette. Overlapping cardiac leads. The visualized skeletal structures are unremarkable. IMPRESSION: No acute cardiopulmonary disease Electronically Signed   By: Karen Kays M.D.   On: 09/22/2022 16:03    Procedures Procedures    Medications Ordered in ED Medications  lactated ringers bolus 1,000 mL (0 mLs Intravenous Stopped 09/22/22 1656)  potassium chloride 10 mEq  in 100 mL IVPB (0 mEq Intravenous Stopped 09/22/22 1656)  potassium chloride (KLOR-CON) packet 40 mEq (40 mEq Oral Given 09/22/22 1849)  lactated ringers bolus 1,000 mL (0 mLs Intravenous Stopped 09/22/22 2144)  iohexol (OMNIPAQUE) 350 MG/ML injection 100 mL (100 mLs Intravenous Contrast Given 09/22/22 2018)    ED Course/ Medical Decision Making/ A&P                                 Medical Decision Making Amount and/or Complexity of Data Reviewed Labs: ordered. Radiology: ordered.  Risk Prescription drug management.   57 year old female presents to the ER with complaints  of lightheadedness, shortness of breath, left arm pain and nausea.  Vitals on arrival overall reassuring.  Physical exam large unremarkable, abdomen is soft and nontender, lung sounds clear, no appreciable neurologic deficits, equal strength and sensation in upper and lower extremities bilaterally.  Differential is broad, includes dehydration, vertigo, ACS, viral infection, PE, new onset heart failure, less likely dissection  Labs ordered, reviewed, interpreted by me.  BMP with a potassium of 3, and interestingly an anion gap acidosis with a anion gap of 17 and a CO2 of 15.  Glucose 143.  No history of DM.  Does not appear to be on any SGLT2 inhibitors.  BC unremarkable.  EKG reviewed, normal sinus rhythm with a right bundle branch block.  Prior EKGs reviewed, this is present on prior EKGs.  In regards to anion gap acidosis, we will add on ethanol, salicylate, lactic acid, will add on hepatic function panel, BNP and lipase for nausea.  UA is pending.  Will replete her potassium.  Will also give her 1 L LR bolus.  Orthostatic vitals pending.  Orthostatic vitals negative.  Her BNP, troponin, lipase and ethanol are negative.  Salicylate level negative.  Lactic acid is elevated at 2.3 would explain her anion gap acidosis.  Lactic acid 2.3 after 1 L of LR.  Potassium was repleted.  Patient denies any excessive diarrhea, does state  that she eats small meals.  CT of the abdomen pelvis was ordered to rule out possible malignancy as a cause of her lactic acidosis.  CT of the abdomen reviewed and agree with radiology read, consistent with diarrheal state.  No leukocytosis, no fever, and no risk factors that would suggest that she would have C. Difficile. She denies any diarrhea.  Had a dry decision-making conversation, no indication for stool testing at this time.  Repeat lactic acid after another liter of fluids 1.2.  I suspect all of her lab abnormalities are secondary to dehydration.  She is overall feeling better.  I encouraged her to follow-up with her PCP.  We discussed return precautions.  Stable for discharge. Final Clinical Impression(s) / ED Diagnoses Final diagnoses:  Dehydration    Rx / DC Orders ED Discharge Orders     None          Leone Brand 09/22/22 2232    Melene Plan, DO 09/22/22 2257

## 2022-09-22 NOTE — ED Notes (Signed)
Pt could not collect urine sample at this time.

## 2022-09-22 NOTE — Discharge Instructions (Addendum)
Your workup today was overall reassuring.  Your labs showed findings likely consistent with dehydration.  As discussed, your CT scan did show some indication of possible diarrheal illness but without any symptoms I do not think we need to test your stool for infectious causes.  Please continue to drink plenty of fluids. Make sure to complete your coronary CT.  Follow-up with your primary care doctor.  Return to the ER for any new or worsening symptoms.

## 2022-09-22 NOTE — ED Notes (Signed)
Ask patient x 2 for urine sample unable to get one at this time.

## 2022-09-22 NOTE — ED Triage Notes (Signed)
Patient states she woke up dizzy this am /co pain in left arm with c/o nausea states she drove herself to the ED.

## 2022-09-25 ENCOUNTER — Telehealth: Payer: Self-pay | Admitting: Family Medicine

## 2022-09-25 NOTE — Telephone Encounter (Signed)
Patient called in regarding appointment tomorrow, requested a call back whenever possible to discuss getting a referral to GI as soon as possible. Advised that patient would have to be evaluated in order to get referral, patient is asking for a call back whenever possible.

## 2022-09-25 NOTE — Telephone Encounter (Signed)
LMTCB

## 2022-09-26 ENCOUNTER — Encounter: Payer: Self-pay | Admitting: Nurse Practitioner

## 2022-09-26 ENCOUNTER — Other Ambulatory Visit: Payer: Self-pay | Admitting: Family

## 2022-09-26 ENCOUNTER — Encounter: Payer: Self-pay | Admitting: *Deleted

## 2022-09-26 ENCOUNTER — Ambulatory Visit (INDEPENDENT_AMBULATORY_CARE_PROVIDER_SITE_OTHER): Payer: 59 | Admitting: Nurse Practitioner

## 2022-09-26 VITALS — BP 128/76 | HR 68 | Temp 97.4°F | Ht 64.0 in | Wt 213.6 lb

## 2022-09-26 DIAGNOSIS — K8020 Calculus of gallbladder without cholecystitis without obstruction: Secondary | ICD-10-CM

## 2022-09-26 DIAGNOSIS — K219 Gastro-esophageal reflux disease without esophagitis: Secondary | ICD-10-CM

## 2022-09-26 DIAGNOSIS — R1011 Right upper quadrant pain: Secondary | ICD-10-CM | POA: Diagnosis not present

## 2022-09-26 DIAGNOSIS — R109 Unspecified abdominal pain: Secondary | ICD-10-CM

## 2022-09-26 DIAGNOSIS — R110 Nausea: Secondary | ICD-10-CM | POA: Diagnosis not present

## 2022-09-26 LAB — POCT URINALYSIS DIPSTICK
Bilirubin, UA: NEGATIVE
Blood, UA: POSITIVE
Glucose, UA: POSITIVE — AB
Ketones, UA: POSITIVE
Leukocytes, UA: NEGATIVE
Nitrite, UA: NEGATIVE
Protein, UA: POSITIVE — AB
Spec Grav, UA: >=1.03 — AB
Urobilinogen, UA: 1 U/dL
pH, UA: 5.5

## 2022-09-26 LAB — COMPREHENSIVE METABOLIC PANEL WITH GFR
ALT: 12 U/L (ref 0–35)
AST: 17 U/L (ref 0–37)
Albumin: 4.1 g/dL (ref 3.5–5.2)
Alkaline Phosphatase: 81 U/L (ref 39–117)
BUN: 13 mg/dL (ref 6–23)
CO2: 27 meq/L (ref 19–32)
Calcium: 9.4 mg/dL (ref 8.4–10.5)
Chloride: 104 meq/L (ref 96–112)
Creatinine, Ser: 0.89 mg/dL (ref 0.40–1.20)
GFR: 71.7 mL/min
Glucose, Bld: 99 mg/dL (ref 70–99)
Potassium: 3.7 meq/L (ref 3.5–5.1)
Sodium: 139 meq/L (ref 135–145)
Total Bilirubin: 0.8 mg/dL (ref 0.2–1.2)
Total Protein: 7.3 g/dL (ref 6.0–8.3)

## 2022-09-26 LAB — CBC
HCT: 41.9 % (ref 36.0–46.0)
Hemoglobin: 13.6 g/dL (ref 12.0–15.0)
MCHC: 32.4 g/dL (ref 30.0–36.0)
MCV: 91.5 fl (ref 78.0–100.0)
Platelets: 376 10*3/uL (ref 150.0–400.0)
RBC: 4.58 Mil/uL (ref 3.87–5.11)
RDW: 14.4 % (ref 11.5–15.5)
WBC: 9.5 10*3/uL (ref 4.0–10.5)

## 2022-09-26 MED ORDER — ONDANSETRON 4 MG PO TBDP
4.0000 mg | ORAL_TABLET | Freq: Three times a day (TID) | ORAL | 0 refills | Status: DC | PRN
Start: 2022-09-26 — End: 2023-03-23

## 2022-09-26 NOTE — Patient Instructions (Signed)
Nice to see you today I will be in touch with the labs and imaging once I have it  Follow up if no improvement or worsening symptoms

## 2022-09-26 NOTE — Assessment & Plan Note (Signed)
Suspect gallbladder involvement patient to continue taking her omeprazole as she has been spotty with adherence.  Pending labs pending ultrasound right upper quadrant

## 2022-09-26 NOTE — Telephone Encounter (Signed)
Patient was seen in office by Audria Nine, NP today. Closing note.

## 2022-09-26 NOTE — Assessment & Plan Note (Signed)
Patient was referred to Dr. Lovell Sheehan in the past.  Gallbladder still intact CT abdomen and pelvis in the ER showed gallstones we will do ultrasound of the right upper quadrant pending result consider HIDA scan or just referral to surgery.  Pending labs patient was given information to call to set up ultrasound

## 2022-09-26 NOTE — Progress Notes (Signed)
Acute Office Visit  Subjective:     Patient ID: [name], female    DOB: 07-13-1965, 57 y.o.   MRN: [id number]  Chief Complaint  Patient presents with   right side pain    Pt complains of right side pain started Saturday. Pain level 4 today. Pt states she hasn't been eating. Pt states she has chills. Pt states she is still weak from ER visit.     HPI Patient is in today for Right stated pain with a history of GERD, eczema, hematuria, SVT, estrogen therapy   Of note patient was seen in the ED on 09/22/2019 and had an extensive work up that r/o cardiac etiology. She did under go a CT abdomen and pelivs that show cholelithasis  States that it started on Saturday night that woke her up. State that it is upper right quardrant in her back that is raditiatn to the front. States that she has no history of the same and she would stop eating and it would resovle. Has seen Gi in the past and told her her galbladder.  States that she has no dirrhea. 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 Negative for fever.  Respiratory:  Positive for shortness of breath. Negative for cough.   Cardiovascular:  Negative for chest pain.  Gastrointestinal:  Positive for abdominal pain and nausea. Negative for constipation, diarrhea and vomiting.  Neurological:  Positive for dizziness.        Objective:    BP 128/76   Pulse 68   Temp (!) 97.4 F (36.3 C) (Temporal)   Ht 5\' 4"  (1.626 m)   Wt 213 lb 9.6 oz (96.9 kg)   LMP 01/31/2015   SpO2 98%   BMI 36.66 kg/m  BP Readings from Last 3 Encounters:  09/26/22 128/76  09/22/22 134/69  08/24/22 124/74   Wt Readings from Last 3 Encounters:  09/26/22 213 lb 9.6 oz (96.9 kg)  09/22/22 219 lb (99.3 kg)  08/24/22 216  lb (98 kg)      Physical Exam Vitals and nursing note reviewed.  Constitutional:      Appearance: Normal appearance.  Cardiovascular:     Rate and Rhythm: Normal rate and regular rhythm.     Heart sounds: Normal heart sounds.  Pulmonary:     Effort: Pulmonary effort is normal.     Breath sounds: Normal breath sounds.  Abdominal:     General: Bowel sounds are normal. There is no distension.     Palpations: There is no mass.     Tenderness: There is abdominal tenderness. There is no right CVA tenderness or left CVA tenderness.     Hernia: No hernia is present.  Musculoskeletal:       Arms:  Neurological:     Mental Status: She is alert.     Results for orders placed or performed in visit on 09/26/22  POCT urinalysis dipstick  Result Value Ref Range   Color, UA Yellow    Clarity, UA cloudy    Glucose, UA Positive (A) Negative   Bilirubin, UA neg    Ketones, UA pos    Spec Grav, UA >=1.030 (A) 1.010 - 1.025  Blood, UA pos    pH, UA 5.5 5.0 - 8.0   Protein, UA Positive (A) Negative   Urobilinogen, UA 1.0 0.2 or 1.0 E.U./dL   Nitrite, UA neg    Leukocytes, UA Negative Negative   Appearance     Odor          Assessment & Plan:   Problem List Items Addressed This Visit       Digestive   Calculus of gallbladder without cholecystitis without obstruction    Patient was referred to Dr. Lovell Sheehan in the past.  Gallbladder still intact CT abdomen and pelvis in the ER showed gallstones we will do ultrasound of the right upper quadrant pending result consider HIDA scan or just referral to surgery.  Pending labs patient was given information to call to set up ultrasound      Relevant Orders   US Abdomen Limited RUQ (LIVER/GB)     Other   Right lateral abdominal pain - Primary    Suspect gallbladder involvement patient to continue taking her omeprazole as she has been spotty with adherence.  Pending labs pending ultrasound right upper quadrant      Relevant Orders    CBC   Comprehensive metabolic panel   POCT urinalysis dipstick (Completed)   Urine Culture   US Abdomen Limited RUQ (LIVER/GB)   Nausea    Ondansetron 4 mg 3 times daily as needed      Relevant Medications   ondansetron (ZOFRAN-ODT) 4 MG disintegrating tablet    Meds ordered this encounter  Medications   ondansetron (ZOFRAN-ODT) 4 MG disintegrating tablet    Sig: Take 1 tablet (4 mg total) by mouth every 8 (eight) hours as needed for nausea or vomiting.    Dispense:  20 tablet    Refill:  0    Order Specific Question:   Supervising Provider    Answer:   TOWER, MARNE A [[id number]    Return if symptoms worsen or fail to improve.  Audria Nine, NP

## 2022-09-26 NOTE — Assessment & Plan Note (Signed)
Ondansetron 4 mg 3 times daily as needed

## 2022-09-28 LAB — URINE CULTURE
MICRO NUMBER:: 15387696
SPECIMEN QUALITY:: ADEQUATE

## 2022-10-05 ENCOUNTER — Ambulatory Visit
Admission: RE | Admit: 2022-10-05 | Discharge: 2022-10-05 | Disposition: A | Discharge status: Home / Self Care | Payer: 59 | Source: Ambulatory Visit | Attending: Nurse Practitioner | Admitting: Nurse Practitioner

## 2022-10-05 DIAGNOSIS — R1011 Right upper quadrant pain: Secondary | ICD-10-CM | POA: Diagnosis not present

## 2022-10-05 DIAGNOSIS — K8020 Calculus of gallbladder without cholecystitis without obstruction: Secondary | ICD-10-CM | POA: Diagnosis not present

## 2022-10-05 DIAGNOSIS — R109 Unspecified abdominal pain: Secondary | ICD-10-CM

## 2022-10-06 ENCOUNTER — Encounter (HOSPITAL_COMMUNITY): Payer: Self-pay

## 2022-10-06 ENCOUNTER — Encounter: Payer: Self-pay | Admitting: Nurse Practitioner

## 2022-10-06 NOTE — Telephone Encounter (Signed)
I see where results were received but not reviewed yet.

## 2022-10-20 ENCOUNTER — Encounter: Payer: Self-pay | Admitting: Nurse Practitioner

## 2022-12-06 ENCOUNTER — Telehealth: Payer: 59 | Admitting: Physician Assistant

## 2022-12-06 ENCOUNTER — Telehealth: Payer: Self-pay | Admitting: Family Medicine

## 2022-12-06 ENCOUNTER — Other Ambulatory Visit: Payer: Self-pay | Admitting: Internal Medicine

## 2022-12-06 DIAGNOSIS — J019 Acute sinusitis, unspecified: Secondary | ICD-10-CM

## 2022-12-06 DIAGNOSIS — B9689 Other specified bacterial agents as the cause of diseases classified elsewhere: Secondary | ICD-10-CM

## 2022-12-06 MED ORDER — AMOXICILLIN-POT CLAVULANATE 875-125 MG PO TABS: 1.0000 | ORAL_TABLET | Freq: Two times a day (BID) | ORAL | 0 refills | Status: DC

## 2022-12-06 NOTE — Telephone Encounter (Signed)
LAST APPOINTMENT DATE: 09/26/2022 (seen by Audria Nine, NP)  NEXT APPOINTMENT DATE: Visit date not found  Doxycycline 100 mg   LAST REFILL: 07/13/2022  QTY: #14 1RF    Pt states that she is traveling out of state tomorrow and needs refill today. Pt stated that the pharmacy told her to contact PCP due to her waiting too long to pick up prescription. Pt stated the infection came back and that is why she was giving refills for this script.

## 2022-12-06 NOTE — Progress Notes (Signed)
Virtual Visit Consent   [name], you are scheduled for a virtual visit with a [facility] provider today. Just as with appointments in the office, your consent must be obtained to participate. Your consent will be active for this visit and any virtual visit you may have with one of our providers in the next 365 days. If you have a MyChart account, a copy of this consent can be sent to you electronically.  As this is a virtual visit, video technology does not allow for your provider to perform a traditional examination. This may limit your provider's ability to fully assess your condition. If your provider identifies any concerns that need to be evaluated in person or the need to arrange testing (such as labs, EKG, etc.), we will make arrangements to do so. Although advances in technology are sophisticated, we cannot ensure that it will always work on either your end or our end. If the connection with a video visit is poor, the visit may have to be switched to a telephone visit. With either a video or telephone visit, we are not always able to ensure that we have a secure connection.  By engaging in this virtual visit, you consent to the provision of healthcare and authorize for your insurance to be billed (if applicable) for the services provided during this visit. Depending on your insurance coverage, you may receive a charge related to this service.  I need to obtain your verbal consent now. Are you willing to proceed with your visit today? [name] has provided verbal consent on 12/06/2022 for a virtual visit (video or telephone). Piedad Climes, [place]  Date: 12/06/2022 3:46 PM  Virtual Visit via Video Note   I, Piedad Climes, connected with  [name]  ([id number], [birth year]) on 12/06/22 at  3:45 PM EST by a video-enabled telemedicine application and verified that I am speaking with the correct person using two identifiers.  Location: Patient: Virtual Visit Location  Patient: Home Provider: Virtual Visit Location Provider: Home Office   I discussed the limitations of evaluation and management by telemedicine and the availability of in person appointments. The patient expressed understanding and agreed to proceed.    History of Present Illness: [name] is a 57 y.o. who identifies as a female who was assigned female at birth, and is being seen today for possible sinusitis. Patient endorses around a week or so of nasal and head congestion with drainage that began initially to improve but then worsened again. Denies fever, chills, aches. Some sinus pain but denies tooth pain.  Denies recent travel or sick contact.   OTC -- Robitussin, Nyquil  HPI: HPI  Problems:  Patient Active Problem List   Diagnosis Date Noted   Nausea 09/26/2022   Chest pain 06/06/2022   Advance care planning 06/27/2021   Vitamin D deficiency 06/27/2021   Mixed stress and urge urinary incontinence 01/20/2021   Current use of estrogen therapy 01/20/2021   LFT elevation 06/13/2020   Right lateral abdominal pain 06/12/2019   Hematuria, microscopic 06/12/2019   Fatigue 11/08/2017   Lightheadedness 05/02/2017   Routine general medical examination at a health care facility 10/04/2015   Acute non-recurrent maxillary sinusitis 04/01/2014   Calculus of gallbladder without cholecystitis without obstruction 12/01/2013   Paresthesia 12/05/2012   GERD (gastroesophageal reflux disease) 07/28/2010   ECZEMA 01/21/2008   CHRONIC RHINITIS 10/01/2007   HEADACHE, CHRONIC 10/01/2007   HYPERCHOLESTEROLEMIA 03/27/2007   SUPRAVENTRICULAR TACHYCARDIA 03/27/2007  CONSTIPATION, CHRONIC 03/27/2007   ANXIETY, MILD 07/16/2006    Allergies:  Allergies  Allergen Reactions   Requip [Ropinirole Hcl]     Intolerant; makes her sleepy   Sulfonamide Derivatives Rash   Medications:  Current Outpatient Medications:    amoxicillin-clavulanate (AUGMENTIN) 875-125 MG tablet, Take 1 tablet by mouth 2  (two) times daily., Disp: 14 tablet, Rfl: 0   Cholecalciferol (VITAMIN D3) 50 MCG (2000 UT) TABS, Take 4,000 Units by mouth daily., Disp: , Rfl:    cyclobenzaprine (FLEXERIL) 10 MG tablet, Take 0.5-1 tablets (5-10 mg total) by mouth at bedtime as needed for muscle spasms., Disp: 30 tablet, Rfl: 2   estradiol (CLIMARA - DOSED IN MG/24 HR) 0.1 mg/24hr patch, PLACE 1 PATCH (0.1 MG TOTAL) ONTO THE SKIN ONCE A WEEK, Disp: 12 patch, Rfl: 3   fluticasone (FLONASE) 50 MCG/ACT nasal spray, PLACE 2 SPRAYS INTO BOTH NOSTRILS DAILY. IN EACH NOSTRIL, Disp: 48 mL, Rfl: 1   metoprolol tartrate (LOPRESSOR) 100 MG tablet, TAKE 2 HOURS PRIOR TO CT SCAN (Patient not taking: Reported on 09/26/2022), Disp: 1 tablet, Rfl: 0   Multiple Vitamins-Minerals (MULTI-VITAMIN GUMMIES) CHEW, Chew 2 tablets by mouth daily. , Disp: , Rfl:    omeprazole (PRILOSEC) 20 MG capsule, TAKE 1 CAPSULE BY MOUTH EVERY DAY, Disp: 30 capsule, Rfl: 3   ondansetron (ZOFRAN-ODT) 4 MG disintegrating tablet, Take 1 tablet (4 mg total) by mouth every 8 (eight) hours as needed for nausea or vomiting., Disp: 20 tablet, Rfl: 0  Observations/Objective: Patient is well-developed, well-nourished in no acute distress.  Resting comfortably at home.  Head is normocephalic, atraumatic.  No labored breathing. Speech is clear and coherent with logical content.  Patient is alert and oriented at baseline.   Assessment and Plan: 1. Acute bacterial sinusitis - amoxicillin-clavulanate (AUGMENTIN) 875-125 MG tablet; Take 1 tablet by mouth 2 (two) times daily.  Dispense: 14 tablet; Refill: 0  Rx Augmentin.  Increase fluids.  Rest.  Saline nasal spray.  Probiotic.  Mucinex as directed.  Humidifier in bedroom. Resume Flonase.  Call or return to clinic if symptoms are not improving.   Follow Up Instructions: I discussed the assessment and treatment plan with the patient. The patient was provided an opportunity to ask questions and all were answered. The patient  agreed with the plan and demonstrated an understanding of the instructions.  A copy of instructions were sent to the patient via MyChart unless otherwise noted below.   The patient was advised to call back or seek an in-person evaluation if the symptoms worsen or if the condition fails to improve as anticipated.    Piedad Climes, PA-C

## 2022-12-06 NOTE — Telephone Encounter (Signed)
Prescription Request  12/06/2022  LOV: 06/13/2022  What is the name of the medication or equipment? doxycycline (VIBRA-TABS) 100 MG tablet   Have you contacted your pharmacy to request a refill? Yes   Which pharmacy would you like this sent to?  [id number] [address]ann Sheen, [place] - [address] [place] [address] Phone: [phone] Fax: [phone]    Patient notified that their request is being sent to the clinical staff for review and that they should receive a response within 2 business days.   Please advise at [facility] [phone]

## 2022-12-06 NOTE — Telephone Encounter (Signed)
I called the pt and advised that she would need to be seen to get a refill on the antibiotic. I helped get her scheduled on [email or web address] on at virtual visit at 345 today.

## 2022-12-06 NOTE — Patient Instructions (Signed)
Whitney Post, thank you for joining Piedad Climes, PA-C for today's virtual visit.  While this provider is not your primary care provider (PCP), if your PCP is located in our provider database this encounter information will be shared with them immediately following your visit.   A [facility] MyChart account gives you access to today's visit and all your visits, tests, and labs performed at [facility] " click here if you don't have a [facility] MyChart account or go to [email or web address]  Consent: (Patient) [name] provided verbal consent for this virtual visit at the beginning of the encounter.  Current Medications:  Current Outpatient Medications:    Cholecalciferol (VITAMIN D3) 50 MCG (2000 UT) TABS, Take 4,000 Units by mouth daily., Disp: , Rfl:    cyclobenzaprine (FLEXERIL) 10 MG tablet, Take 0.5-1 tablets (5-10 mg total) by mouth at bedtime as needed for muscle spasms., Disp: 30 tablet, Rfl: 2   doxycycline (VIBRA-TABS) 100 MG tablet, Take 1 tablet (100 mg total) by mouth 2 (two) times daily. (Patient not taking: Reported on 08/24/2022), Disp: 14 tablet, Rfl: 1   estradiol (CLIMARA - DOSED IN MG/24 HR) 0.1 mg/24hr patch, PLACE 1 PATCH (0.1 MG TOTAL) ONTO THE SKIN ONCE A WEEK, Disp: 12 patch, Rfl: 3   fluticasone (FLONASE) 50 MCG/ACT nasal spray, PLACE 2 SPRAYS INTO BOTH NOSTRILS DAILY. IN EACH NOSTRIL, Disp: 48 mL, Rfl: 1   metoprolol tartrate (LOPRESSOR) 100 MG tablet, TAKE 2 HOURS PRIOR TO CT SCAN (Patient not taking: Reported on 09/26/2022), Disp: 1 tablet, Rfl: 0   Multiple Vitamins-Minerals (MULTI-VITAMIN GUMMIES) CHEW, Chew 2 tablets by mouth daily. , Disp: , Rfl:    omeprazole (PRILOSEC) 20 MG capsule, TAKE 1 CAPSULE BY MOUTH EVERY DAY, Disp: 30 capsule, Rfl: 3   ondansetron (ZOFRAN-ODT) 4 MG disintegrating tablet, Take 1 tablet (4 mg total) by mouth every 8 (eight) hours as needed for nausea or vomiting., Disp: 20 tablet, Rfl: 0   Medications  ordered in this encounter:  No orders of the defined types were placed in this encounter.    *If you need refills on other medications prior to your next appointment, please contact your pharmacy*  Follow-Up: Call back or seek an in-person evaluation if the symptoms worsen or if the condition fails to improve as anticipated.  [facility] [phone]  Other Instructions Please take antibiotic as directed.  Increase fluid intake.  Use Saline nasal spray.  Take a daily multivitamin. Resume your Flonase.  Place a humidifier in the bedroom.  Please call or return clinic if symptoms are not improving.  Sinusitis Sinusitis is redness, soreness, and swelling (inflammation) of the paranasal sinuses. Paranasal sinuses are air pockets within the bones of your face (beneath the eyes, the middle of the forehead, or above the eyes). In healthy paranasal sinuses, mucus is able to drain out, and air is able to circulate through them by way of your nose. However, when your paranasal sinuses are inflamed, mucus and air can become trapped. This can allow bacteria and other germs to grow and cause infection. Sinusitis can develop quickly and last only a short time (acute) or continue over a long period (chronic). Sinusitis that lasts for more than 12 weeks is considered chronic.  CAUSES  Causes of sinusitis include: Allergies. Structural abnormalities, such as displacement of the cartilage that separates your nostrils (deviated septum), which can decrease the air flow through your nose and sinuses and affect sinus drainage. Functional abnormalities,  such as when the small hairs (cilia) that line your sinuses and help remove mucus do not work properly or are not present. SYMPTOMS  Symptoms of acute and chronic sinusitis are the same. The primary symptoms are pain and pressure around the affected sinuses. Other symptoms include: Upper toothache. Earache. Headache. Bad breath. Decreased sense of  smell and taste. A cough, which worsens when you are lying flat. Fatigue. Fever. Thick drainage from your nose, which often is green and may contain pus (purulent). Swelling and warmth over the affected sinuses. DIAGNOSIS  Your caregiver will perform a physical exam. During the exam, your caregiver may: Look in your nose for signs of abnormal growths in your nostrils (nasal polyps). Tap over the affected sinus to check for signs of infection. View the inside of your sinuses (endoscopy) with a special imaging device with a light attached (endoscope), which is inserted into your sinuses. If your caregiver suspects that you have chronic sinusitis, one or more of the following tests may be recommended: Allergy tests. Nasal culture A sample of mucus is taken from your nose and sent to a lab and screened for bacteria. Nasal cytology A sample of mucus is taken from your nose and examined by your caregiver to determine if your sinusitis is related to an allergy. TREATMENT  Most cases of acute sinusitis are related to a viral infection and will resolve on their own within 10 days. Sometimes medicines are prescribed to help relieve symptoms (pain medicine, decongestants, nasal steroid sprays, or saline sprays).  However, for sinusitis related to a bacterial infection, your caregiver will prescribe antibiotic medicines. These are medicines that will help kill the bacteria causing the infection.  Rarely, sinusitis is caused by a fungal infection. In theses cases, your caregiver will prescribe antifungal medicine. For some cases of chronic sinusitis, surgery is needed. Generally, these are cases in which sinusitis recurs more than 3 times per year, despite other treatments. HOME CARE INSTRUCTIONS  Drink plenty of water. Water helps thin the mucus so your sinuses can drain more easily. Use a humidifier. Inhale steam 3 to 4 times a day (for example, sit in the bathroom with the shower running). Apply a  warm, moist washcloth to your face 3 to 4 times a day, or as directed by your caregiver. Use saline nasal sprays to help moisten and clean your sinuses. Take over-the-counter or prescription medicines for pain, discomfort, or fever only as directed by your caregiver. SEEK IMMEDIATE MEDICAL CARE IF: You have increasing pain or severe headaches. You have nausea, vomiting, or drowsiness. You have swelling around your face. You have vision problems. You have a stiff neck. You have difficulty breathing. MAKE SURE YOU:  Understand these instructions. Will watch your condition. Will get help right away if you are not doing well or get worse. Document Released: 01/16/2005 Document Revised: 04/10/2011 Document Reviewed: 01/31/2011 [facility] Patient Information 2014 [place], [place].    If you have been instructed to have an in-person evaluation today at a local Urgent Care facility, please use the link below. It will take you to a list of all of our available [facility], including address, phone number and hours of operation. Please do not delay care.  [facility]  If you or a family member do not have a primary care provider, use the link below to schedule a visit and establish care. When you choose a [facility] primary care physician or advanced practice provider, you gain a long-term  partner in health. Find a Primary Care Provider  Learn more about [facility] in-office and virtual care options: [facility] - Get Care Now

## 2022-12-06 NOTE — Telephone Encounter (Signed)
Agree. Thanks

## 2022-12-07 NOTE — Telephone Encounter (Signed)
Yeah---I saw her 5 months ago

## 2023-02-19 ENCOUNTER — Telehealth: Payer: Self-pay | Admitting: Family Medicine

## 2023-02-19 NOTE — Telephone Encounter (Signed)
Patient dropped off document  Health Exam Certification , to be filled out by provider. Patient requested to send it back via Call Patient to pick up within ASAP. Document is located in providers tray at front office.Please advise at [facility] [phone] She is needing the form back for employee.

## 2023-02-20 ENCOUNTER — Telehealth: Payer: Self-pay | Admitting: Family Medicine

## 2023-02-20 NOTE — Telephone Encounter (Signed)
Called patient and informed her that she will need to come into the office for a physical since her last physical was in 2023. Patient stated that she needed this formed filled out ASAP for she is needing it for a job. Patient is scheduled for physical with Dr. Para March on 03/08/23 at 11:30 AM. She stated that she will look on the employer's website to see the list of places that will take her.

## 2023-03-08 ENCOUNTER — Telehealth: Payer: Self-pay | Admitting: Family Medicine

## 2023-03-08 ENCOUNTER — Encounter: Payer: BC Managed Care – PPO | Admitting: Family Medicine

## 2023-03-08 NOTE — Telephone Encounter (Signed)
 Patient will be stopping by in regards to ppw.   Copied from CRM [id number]. Topic: General - Other >> Mar 08, 2023  9:41 AM Victoria A wrote: Reason for CRM: Patient said that she received a call that Dr. Cleatus had emergency. Agent said she would cancel and reschedule appointment. Patient said leave the appointment because she was stopping by office because there was paperwork that she needed completed and it has been over two weeks

## 2023-03-08 NOTE — Telephone Encounter (Signed)
 Patient came in to office today and picked up paperwork. She is aware that its not completed because she needed to be seen before provider will fill out

## 2023-03-23 ENCOUNTER — Encounter: Payer: Self-pay | Admitting: Family Medicine

## 2023-03-23 ENCOUNTER — Ambulatory Visit (INDEPENDENT_AMBULATORY_CARE_PROVIDER_SITE_OTHER): Payer: BC Managed Care – PPO | Admitting: Family Medicine

## 2023-03-23 VITALS — BP 132/64 | HR 79 | Temp 98.1°F | Ht 62.99 in | Wt 219.4 lb

## 2023-03-23 DIAGNOSIS — Z1211 Encounter for screening for malignant neoplasm of colon: Secondary | ICD-10-CM

## 2023-03-23 DIAGNOSIS — Z7189 Other specified counseling: Secondary | ICD-10-CM

## 2023-03-23 DIAGNOSIS — Z0000 Encounter for general adult medical examination without abnormal findings: Secondary | ICD-10-CM | POA: Diagnosis not present

## 2023-03-23 DIAGNOSIS — Z23 Encounter for immunization: Secondary | ICD-10-CM

## 2023-03-23 DIAGNOSIS — Z111 Encounter for screening for respiratory tuberculosis: Secondary | ICD-10-CM | POA: Diagnosis not present

## 2023-03-23 DIAGNOSIS — Z79899 Other long term (current) drug therapy: Secondary | ICD-10-CM

## 2023-03-23 DIAGNOSIS — M549 Dorsalgia, unspecified: Secondary | ICD-10-CM

## 2023-03-23 NOTE — Progress Notes (Signed)
 CPE- See plan.  Routine anticipatory guidance given to patient.  See health maintenance.  The possibility exists that previously documented standard health maintenance information may have been brought forward from a previous encounter into this note.  If needed, that same information has been updated to reflect the current situation based on today's encounter.    Tetanus- 2023 Flu 2025 PNA not due Shingles d/w pt.   covid vaccine 2021 No Pap due to hysterectomy.   Mammogram 2022, d/w pt about getting follow up.   DXA not due yet.   HRT per gyn.   Living will d/w pt.  Husband designated if patient were incapacitated.   cologuard ordered 2025  She had labs done with insurance- d/w pt about dropping off a copy.    She is having R leg pain that is worse with weightbearing after getting out of bed.  Radiates down the leg.  Prev back films reviewed with patient.    She is trying to get used to working in the day and sleeping at night- she is not working 3rd shift now.    She is going to substitute teach. D/w pt.  Form to be filled out when TB test done.  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    HRT per outside clinic.  D/w pt. She is trying to taper down slowly.    D/w pt about getting cardiac CT done when possible.  Ordered per outside clinic.   PMH and SH reviewed  Meds, vitals, and allergies reviewed.   ROS: Per HPI.  Unless specifically indicated otherwise in HPI, the patient denies:  General: fever. Eyes: acute vision changes ENT: sore throat Cardiovascular: chest pain Respiratory: SOB GI: vomiting GU: dysuria Musculoskeletal: acute back pain Derm: acute rash Neuro: acute motor dysfunction Psych: worsening mood Endocrine: polydipsia Heme: bleeding Allergy: hayfever  GEN: nad, alert and oriented HEENT: ncat NECK: supple w/o LA CV: rrr. PULM: ctab, no inc wob ABD: soft, +bs EXT: no edema SKIN: no acute rash Midline back not ttp SLR leg neg, S/S grossly wnl.

## 2023-03-23 NOTE — Patient Instructions (Addendum)
Flu shot today.   You can call for a mammogram at the [facility] [address] [place] [address] [place]  Cologuard ordered.   Use the back exercises and please drop off a copy of your labs.   Take care.  Glad to see you.

## 2023-03-25 ENCOUNTER — Encounter: Payer: Self-pay | Admitting: Family Medicine

## 2023-03-25 NOTE — Assessment & Plan Note (Signed)
 HRT per outside clinic.  D/w pt. She is trying to taper down slowly.

## 2023-03-25 NOTE — Assessment & Plan Note (Signed)
 D/w pt about lower back exercises, handout given.  Can update me as needed.

## 2023-03-25 NOTE — Assessment & Plan Note (Signed)
 Tetanus- 2023 Flu 2025 PNA not due Shingles d/w pt.   covid vaccine 2021 No Pap due to hysterectomy.   Mammogram 2022, d/w pt about getting follow up.   DXA not due yet.   HRT per gyn.   Living will d/w pt.  Husband designated if patient were incapacitated.   cologuard ordered 2025  She had labs done with insurance- d/w pt about dropping off a copy.

## 2023-03-25 NOTE — Assessment & Plan Note (Signed)
Living will d/w pt.  Husband designated if patient were incapacitated.  

## 2023-03-26 ENCOUNTER — Other Ambulatory Visit: Payer: Self-pay | Admitting: Family Medicine

## 2023-03-26 DIAGNOSIS — R7612 Nonspecific reaction to cell mediated immunity measurement of gamma interferon antigen response without active tuberculosis: Secondary | ICD-10-CM

## 2023-03-26 LAB — QUANTIFERON-TB GOLD PLUS
Mitogen-NIL: 8.31 [IU]/mL
NIL: 0.05 [IU]/mL
QuantiFERON-TB Gold Plus: POSITIVE — AB
TB1-NIL: 4.87 [IU]/mL
TB2-NIL: 3.05 [IU]/mL

## 2023-03-27 ENCOUNTER — Telehealth: Payer: Self-pay

## 2023-03-27 DIAGNOSIS — Z1211 Encounter for screening for malignant neoplasm of colon: Secondary | ICD-10-CM | POA: Diagnosis not present

## 2023-03-27 NOTE — Telephone Encounter (Signed)
 Copied from CRM [id number]. Topic: General - Other >> Mar 27, 2023 10:14 AM Aletta Edouard wrote: Reason for CRM: patient is calling in regarding a call she received from the office

## 2023-03-27 NOTE — Telephone Encounter (Signed)
 Spoke with patient and she was advised of her results

## 2023-03-28 ENCOUNTER — Other Ambulatory Visit: Payer: Self-pay | Admitting: Family Medicine

## 2023-03-28 ENCOUNTER — Other Ambulatory Visit: Payer: BC Managed Care – PPO

## 2023-03-28 ENCOUNTER — Ambulatory Visit (INDEPENDENT_AMBULATORY_CARE_PROVIDER_SITE_OTHER)
Admission: RE | Admit: 2023-03-28 | Discharge: 2023-03-28 | Disposition: A | Discharge status: Home / Self Care | Payer: BC Managed Care – PPO | Source: Ambulatory Visit | Attending: Family Medicine | Admitting: Family Medicine

## 2023-03-28 DIAGNOSIS — R7612 Nonspecific reaction to cell mediated immunity measurement of gamma interferon antigen response without active tuberculosis: Secondary | ICD-10-CM | POA: Diagnosis not present

## 2023-03-28 DIAGNOSIS — Z111 Encounter for screening for respiratory tuberculosis: Secondary | ICD-10-CM | POA: Diagnosis not present

## 2023-03-29 ENCOUNTER — Encounter: Payer: Self-pay | Admitting: Family Medicine

## 2023-03-29 ENCOUNTER — Telehealth: Payer: Self-pay

## 2023-03-29 NOTE — Telephone Encounter (Signed)
 Copied from CRM [id number]. Topic: General - Other >> Mar 29, 2023  3:07 PM Kathryne Eriksson wrote: Reason for CRM: Health Form >> Mar 29, 2023  3:09 PM Kathryne Eriksson wrote: Patient called in wanting to know if the health form that Joaquim Nam, MD had to complete was ready for pickup.

## 2023-04-01 NOTE — Telephone Encounter (Signed)
 The form required TB screening and I ordered the TB gold test instead of PPD given her history.    I ordered the screening since it was required on her paperwork.   She may not need treatment, but based on my conversation with the ID clinic, it is reasonable to see them to see if she needs treatment.   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  I don't know what her employer's response would be to that.  Please pull the form for me.

## 2023-04-02 LAB — COLOGUARD: COLOGUARD: NEGATIVE

## 2023-04-02 NOTE — Telephone Encounter (Signed)
 Patient has been notified through my chart that form is available for pickup

## 2023-04-02 NOTE — Telephone Encounter (Signed)
 Form done, please scan and send for pick up.  Thanks.

## 2023-04-03 ENCOUNTER — Encounter: Payer: Self-pay | Admitting: Family Medicine

## 2023-04-18 ENCOUNTER — Other Ambulatory Visit: Payer: Self-pay

## 2023-04-18 MED ORDER — FLUTICASONE PROPIONATE 50 MCG/ACT NA SUSP
2.0000 | Freq: Every day | NASAL | 1 refills | Status: AC
Start: 2023-04-18 — End: ?

## 2023-07-31 ENCOUNTER — Other Ambulatory Visit: Payer: Self-pay | Admitting: Family Medicine

## 2023-07-31 DIAGNOSIS — R928 Other abnormal and inconclusive findings on diagnostic imaging of breast: Secondary | ICD-10-CM

## 2023-09-03 ENCOUNTER — Encounter: Payer: Self-pay | Admitting: Nurse Practitioner

## 2023-09-03 ENCOUNTER — Ambulatory Visit: Payer: Self-pay

## 2023-09-03 ENCOUNTER — Other Ambulatory Visit: Payer: Self-pay | Admitting: Family Medicine

## 2023-09-03 DIAGNOSIS — Z1231 Encounter for screening mammogram for malignant neoplasm of breast: Secondary | ICD-10-CM

## 2023-09-03 NOTE — Telephone Encounter (Signed)
 FYI Only or Action Required?: Action required by provider: request for appointment.  Patient was last seen in primary care on 03/23/2023 by Cleatus Arlyss RAMAN, MD.  Called Nurse Triage reporting Facial Pain.  Symptoms began last Monday .  Interventions attempted: OTC medications: Dayquil/Nyquil/Sudafed/cough drops.  Symptoms are: gradually worsening.  Triage Disposition: See PCP When Office is Open (Within 3 Days)  Patient/caregiver understands and will follow disposition?: yes- unable to find in office or virtual appt- can pt be [employer] call pt if not  advised pt that if cannot work her in she needs to go to [facility]- waiting on call form PCP office until then        Message from [facility] G sent at 09/03/2023  9:56 AM EDT  congestion, cough, sinus ( worsening )   Reason for Disposition  [1] Sinus congestion (pressure, fullness) AND [2] present > 10 days  Answer Assessment - Initial Assessment Questions 1. LOCATION: Where does it hurt?      Forehead and eyes 2. ONSET: When did the sinus pain start?  (e.g., hours, days)      Last Monday  3. SEVERITY: How bad is the pain?   (Scale 0-10; or none, mild, moderate or severe)     Mild  4. RECURRENT SYMPTOM: Have you ever had sinus problems before? If Yes, ask: When was the last time? and What happened that time?      yes 5. NASAL CONGESTION: Is the nose blocked? If Yes, ask: Can you open it or must you breathe through your mouth?     No  6. NASAL DISCHARGE: Do you have discharge from your nose? If so ask, What color?     Yes/yellow 7. FEVER: Do you have a fever? If Yes, ask: What is it, how was it measured, and when did it start?      no 8. OTHER SYMPTOMS: Do you have any other symptoms? (e.g., sore throat, cough, earache, difficulty breathing)     Cough, chest soreness  Protocols used: Sinus Pain or Congestion-A-AH

## 2023-09-03 NOTE — Telephone Encounter (Signed)
 Returned call to patient scheduled her to come in on 8/6

## 2023-09-05 ENCOUNTER — Encounter: Payer: Self-pay | Admitting: Family

## 2023-09-05 ENCOUNTER — Ambulatory Visit: Admitting: Family

## 2023-09-05 VITALS — BP 114/82 | HR 72 | Temp 98.0°F | Ht 64.5 in | Wt 221.4 lb

## 2023-09-05 DIAGNOSIS — J22 Unspecified acute lower respiratory infection: Secondary | ICD-10-CM | POA: Diagnosis not present

## 2023-09-05 DIAGNOSIS — R051 Acute cough: Secondary | ICD-10-CM

## 2023-09-05 MED ORDER — DOXYCYCLINE HYCLATE 100 MG PO TABS: 100.0000 mg | ORAL_TABLET | Freq: Two times a day (BID) | ORAL | 0 refills | Status: AC

## 2023-09-05 MED ORDER — GUAIFENESIN-CODEINE 100-10 MG/5ML PO SOLN: 5.0000 mL | Freq: Three times a day (TID) | ORAL | 0 refills | Status: AC | PRN

## 2023-09-05 NOTE — Assessment & Plan Note (Signed)
Take antibiotic as prescribed. Increase oral fluids. Pt to f/u if sx worsen and or fail to improve in 2-3 days. RX doxycycline 100 mg po bid x 10 days   

## 2023-09-05 NOTE — Progress Notes (Signed)
 Established Patient Office Visit  Subjective:   Patient ID: [name], female    DOB: Apr 20, 1965  Age: 58 y.o. MRN: [id number]  CC:  Chief Complaint  Patient presents with   Acute Visit    Reports sinus pressure, nasal congestion, facial pain and cough x1 week.    HPI: [name] is a 58 y.o. female presenting on 09/05/2023 for Acute Visit (Reports sinus pressure, nasal congestion, facial pain and cough x1 week.)    8 days ago started with nasal congestion, dry cough, slight chest congestion, no sore throat and or ear pain. Has tried multiple otc medications with only mild relief but her stomach was irritated witth nyquil so had to stop taking this.  Feels it more in her chest than in her face.  Took two covid tests last week and were negative.   No fever.         ROS: Negative unless specifically indicated above in HPI.   Relevant past medical history reviewed and updated as indicated.   Allergies and medications reviewed and updated.   Current Outpatient Medications:    Cholecalciferol (VITAMIN D3) 50 MCG (2000 UT) TABS, Take 4,000 Units by mouth daily., Disp: , Rfl:    cyclobenzaprine  (FLEXERIL ) 10 MG tablet, Take 0.5-1 tablets (5-10 mg total) by mouth at bedtime as needed for muscle spasms., Disp: 30 tablet, Rfl: 2   doxycycline  (VIBRA -TABS) 100 MG tablet, Take 1 tablet (100 mg total) by mouth 2 (two) times daily for 10 days., Disp: 20 tablet, Rfl: 0   fluticasone  (FLONASE ) 50 MCG/ACT nasal spray, Place 2 sprays into both nostrils daily. In each nostril, Disp: 48 mL, Rfl: 1   guaiFENesin -codeine  100-10 MG/5ML syrup, Take 5 mLs by mouth 3 (three) times daily as needed for up to 5 days., Disp: 75 mL, Rfl: 0   Multiple Vitamins-Minerals (MULTI-VITAMIN GUMMIES) CHEW, Chew 2 tablets by mouth daily. , Disp: , Rfl:    omeprazole  (PRILOSEC) 20 MG capsule, TAKE 1 CAPSULE BY MOUTH EVERY DAY, Disp: 30 capsule, Rfl: 3  Allergies  Allergen Reactions   Requip  [Ropinirole Hcl]     Intolerant; makes her sleepy   Tuberculin, Ppd     Avoid PPD use given h/o positive test   Sulfonamide Derivatives Rash    Objective:   BP 114/82 (BP Location: Left Arm, Patient Position: Sitting, Cuff Size: Large)   Pulse 72   Temp 98 F (36.7 C) (Temporal)   Ht 5' 4.5 (1.638 m)   Wt 221 lb 6.4 oz (100.4 kg)   LMP 01/31/2015   SpO2 100%   BMI 37.42 kg/m    Physical Exam Vitals reviewed.  Constitutional:      General: She is not in acute distress.    Appearance: Normal appearance. She is normal weight. She is not ill-appearing, toxic-appearing or diaphoretic.  HENT:     Head: Normocephalic.     Right Ear: Tympanic membrane normal.     Left Ear: Tympanic membrane normal.     Nose: Nose normal.     Right Turbinates: Swollen.     Mouth/Throat:     Mouth: Mucous membranes are dry.     Pharynx: No oropharyngeal exudate or posterior oropharyngeal erythema.  Eyes:     Extraocular Movements: Extraocular movements intact.     Pupils: Pupils are equal, round, and reactive to light.  Cardiovascular:     Rate and Rhythm: Normal rate and regular rhythm.     Pulses: Normal pulses.  Heart sounds: Normal heart sounds.  Pulmonary:     Effort: Pulmonary effort is normal.     Breath sounds: Normal breath sounds.  Musculoskeletal:     Cervical back: Normal range of motion.  Neurological:     General: No focal deficit present.     Mental Status: She is alert and oriented to person, place, and time. Mental status is at baseline.  Psychiatric:        Mood and Affect: Mood normal.        Behavior: Behavior normal.        Thought Content: Thought content normal.        Judgment: Judgment normal.     Assessment & Plan:  Lower respiratory infection Assessment & Plan: Take antibiotic as prescribed. Increase oral fluids. Pt to f/u if sx worsen and or fail to improve in 2-3 days. RX doxycycline  100 mg po bid x 10 days   Orders: -     Doxycycline  Hyclate;  Take 1 tablet (100 mg total) by mouth 2 (two) times daily for 10 days.  Dispense: 20 tablet; Refill: 0  Acute cough -     guaiFENesin -Codeine ; Take 5 mLs by mouth 3 (three) times daily as needed for up to 5 days.  Dispense: 75 mL; Refill: 0     Follow up plan: Return for f/u PCP if no improvement in symptoms.  Ginger Patrick, FNP

## 2023-09-06 ENCOUNTER — Ambulatory Visit: Admitting: Primary Care

## 2023-09-13 ENCOUNTER — Encounter

## 2023-09-13 DIAGNOSIS — Z1231 Encounter for screening mammogram for malignant neoplasm of breast: Secondary | ICD-10-CM

## 2023-10-30 ENCOUNTER — Other Ambulatory Visit: Payer: Self-pay | Admitting: Family Medicine

## 2023-10-31 NOTE — Telephone Encounter (Signed)
 LOV: 03/23/23 NOV: nothing scheduled Last Refill: cyclobenzaprine  (FLEXERIL ) 10 MG tablet  11/21/21 30 tablets 2 refills

## 2023-12-20 ENCOUNTER — Encounter

## 2023-12-24 ENCOUNTER — Ambulatory Visit
Admission: RE | Admit: 2023-12-24 | Discharge: 2023-12-24 | Disposition: A | Source: Ambulatory Visit | Attending: Family Medicine | Admitting: Family Medicine

## 2023-12-24 DIAGNOSIS — Z1231 Encounter for screening mammogram for malignant neoplasm of breast: Secondary | ICD-10-CM | POA: Diagnosis not present

## 2023-12-30 ENCOUNTER — Ambulatory Visit: Payer: Self-pay | Admitting: Family Medicine
